# Patient Record
Sex: Male | Born: 1950 | Race: White | Hispanic: No | Marital: Married | Smoking: Former smoker
Health system: Southern US, Community
[De-identification: ages and names within clinical notes are randomized; demographics above are authoritative.]

## PROBLEM LIST (undated history)

## (undated) DIAGNOSIS — M1990 Unspecified osteoarthritis, unspecified site: Secondary | ICD-10-CM

## (undated) DIAGNOSIS — Z974 Presence of external hearing-aid: Secondary | ICD-10-CM

## (undated) DIAGNOSIS — H269 Unspecified cataract: Secondary | ICD-10-CM

## (undated) HISTORY — DX: Presence of external hearing-aid: Z97.4

## (undated) HISTORY — PX: PANCREAS SURGERY: SHX731

## (undated) HISTORY — DX: Unspecified cataract: H26.9

## (undated) HISTORY — DX: Unspecified osteoarthritis, unspecified site: M19.90

## (undated) HISTORY — PX: EYE SURGERY: SHX253

---

## 2003-02-07 ENCOUNTER — Ambulatory Visit (HOSPITAL_COMMUNITY): Admission: RE | Admit: 2003-02-07 | Discharge: 2003-02-07 | Payer: Self-pay | Admitting: Internal Medicine

## 2003-02-07 ENCOUNTER — Encounter: Payer: Self-pay | Admitting: Internal Medicine

## 2003-02-21 ENCOUNTER — Ambulatory Visit (HOSPITAL_COMMUNITY): Admission: RE | Admit: 2003-02-21 | Discharge: 2003-02-21 | Payer: Self-pay | Admitting: Internal Medicine

## 2003-02-21 ENCOUNTER — Encounter: Payer: Self-pay | Admitting: Internal Medicine

## 2005-03-07 ENCOUNTER — Ambulatory Visit (HOSPITAL_COMMUNITY): Admission: RE | Admit: 2005-03-07 | Discharge: 2005-03-07 | Payer: Self-pay | Admitting: Internal Medicine

## 2005-05-27 ENCOUNTER — Ambulatory Visit: Payer: Self-pay | Admitting: Gastroenterology

## 2005-06-06 ENCOUNTER — Ambulatory Visit: Payer: Self-pay | Admitting: Gastroenterology

## 2010-06-25 ENCOUNTER — Other Ambulatory Visit: Payer: Self-pay | Admitting: Occupational Medicine

## 2010-06-25 ENCOUNTER — Ambulatory Visit: Payer: Self-pay

## 2010-06-25 DIAGNOSIS — M79642 Pain in left hand: Secondary | ICD-10-CM

## 2012-05-05 ENCOUNTER — Ambulatory Visit (INDEPENDENT_AMBULATORY_CARE_PROVIDER_SITE_OTHER): Payer: BC Managed Care – PPO | Admitting: Family Medicine

## 2012-05-05 VITALS — BP 124/72 | HR 71 | Temp 98.4°F | Resp 16 | Ht 71.0 in | Wt 213.0 lb

## 2012-05-05 DIAGNOSIS — T148XXA Other injury of unspecified body region, initial encounter: Secondary | ICD-10-CM

## 2012-05-05 DIAGNOSIS — M79609 Pain in unspecified limb: Secondary | ICD-10-CM

## 2012-05-05 DIAGNOSIS — M79669 Pain in unspecified lower leg: Secondary | ICD-10-CM

## 2012-05-05 DIAGNOSIS — W5501XA Bitten by cat, initial encounter: Secondary | ICD-10-CM

## 2012-05-05 DIAGNOSIS — L02419 Cutaneous abscess of limb, unspecified: Secondary | ICD-10-CM

## 2012-05-05 DIAGNOSIS — L03119 Cellulitis of unspecified part of limb: Secondary | ICD-10-CM

## 2012-05-05 MED ORDER — CEFTRIAXONE SODIUM 1 G IJ SOLR
1.0000 g | INTRAMUSCULAR | Status: DC
  Administered 2012-05-05: 1 g via INTRAMUSCULAR

## 2012-05-05 MED ORDER — AMOXICILLIN-POT CLAVULANATE 875-125 MG PO TABS: 1.0000 | ORAL_TABLET | Freq: Two times a day (BID) | ORAL | Status: DC

## 2012-05-05 NOTE — Progress Notes (Signed)
[address]   [place], [place]  [address]   [phone]  Subjective:    Patient ID: Perry Evans, male    DOB: 12-Oct-1950, 61 y.o.   MRN: [id number]  HPIThis 61 y.o. male presents for evaluation of cat bite R calf.  Occurred two days ago.  Playing with cat and cat scratched R posterior calf.  Worried about infection.  No fever/chills/sweats.  No malaise/fatigue.  Last Tetanus vaccine 2011 with finger crush.  Cats vaccines UTD.  Applied peroxide to wound; also washed with soapy water.  Applied neosporin also.  Still swollen and hurts.  No pain in groin region; no pain along inner calf region.  No pain with ROM of ankle.  PCP:  Dr. Harlen Labs   Review of Systems  Constitutional: Negative for fever, chills, diaphoresis and fatigue.  Musculoskeletal: Positive for joint swelling. Negative for arthralgias.  Skin: Positive for color change and wound. Negative for pallor and rash.        Past Medical History  Diagnosis Date  . Arthritis   . Cataract   . Hearing aid worn     Past Surgical History  Procedure Date  . Eye surgery   . Pancreas surgery     Prior to Admission medications   Medication Sig Start Date End Date Taking? Authorizing Provider  meloxicam (MOBIC) 15 MG tablet Take 15 mg by mouth daily.   Yes Historical Provider, MD  amoxicillin-clavulanate (AUGMENTIN) 875-125 MG per tablet Take 1 tablet by mouth 2 (two) times daily. 05/05/12   Ethelda Chick, MD    No Known Allergies  History   Social History  . Marital Status: Married    Spouse Name: N/A    Number of Children: N/A  . Years of Education: N/A   Occupational History  . Not on file.   Social History Main Topics  . Smoking status: Former Smoker    Quit date: 09/03/2000  . Smokeless tobacco: Not on file  . Alcohol Use: No  . Drug Use: Not on file  . Sexually Active: Not on file   Other Topics Concern  . Not on file   Social History Narrative   Employment: works in Teacher, English as a foreign language for [employer].    History reviewed. No pertinent family history.  Objective:   Physical Exam  Nursing note reviewed. Constitutional: He is oriented to person, place, and time. He appears well-developed and well-nourished. No distress.  Cardiovascular: Normal rate, regular rhythm, normal heart sounds and intact distal pulses.   No murmur heard. Musculoskeletal:       Right ankle: He exhibits swelling. He exhibits normal range of motion, no ecchymosis, no deformity and normal pulse. Achilles tendon normal. Achilles tendon exhibits no pain.  Neurological: He is alert and oriented to person, place, and time.  Skin: He is not diaphoretic.       R LOWER LEG:  THREE SUPERFICIAL LINEAR SCRATCH MARKS/ABRASIONS LATERAL ANKLE WITHOUT ERYTHEMA, DRAINAGE, SWELLING, INDURATION.  POSTERIOR DISTAL CALF WITH ISOLATED PUNCTURE WOUND WITH 1 CM AREA OF ERYTHEMA AND 4 CM DIAMETER OF MILD INDURATION; NO FLUCTUANTS; SCANT BLOODY-WHITE DRAINAGE EXPRESSED FROM PUNCTURE WOUND.  NO STREAKING.  +SWELLING LATERAL R ANKLE.  NO CALF INDURATION.  Psychiatric: He has a normal mood and affect. His behavior is normal. Judgment and thought content normal.    WOUND CULTURE PENDING.  ROCEPHIN 1 GRAM IM ADMINISTERED DURING VISIT.     Assessment & Plan:   1. Cat bite  Wound culture, cefTRIAXone (  ROCEPHIN) injection 1 g, amoxicillin-clavulanate (AUGMENTIN) 875-125 MG per tablet  2. Pain, lower leg    3. Cellulitis and abscess of leg       1.  Pain RLE:  New.  Secondary to cat bite, cellulitis.  Recommend Tylenol or Motrin PRN. 2.  Cat bite:  New.  With secondary infection.  Tetanus UTD in 2011.  Cat is pt's pet and immunizations UTD; no unusual behavior.  Local wound care -- clean wound with soap and water daily; apply peroxide once daily; apply Neosporin once daily. Keep clean and covered with activity. 3. Cellulitis RLE:  New.  Wound culture obtained; s/p Rocephin injection in office; treat with Augmentin bid.  RTC  immediately for fever, streaking, LAD, increasing pain, swelling.    Meds ordered this encounter  Medications  . meloxicam (MOBIC) 15 MG tablet    Sig: Take 15 mg by mouth daily.  . cefTRIAXone (ROCEPHIN) injection 1 g    Sig:   . amoxicillin-clavulanate (AUGMENTIN) 875-125 MG per tablet    Sig: Take 1 tablet by mouth 2 (two) times daily.    Dispense:  20 tablet    Refill:  0

## 2012-05-05 NOTE — Patient Instructions (Addendum)
1. Cat bite  Wound culture, cefTRIAXone (ROCEPHIN) injection 1 g, amoxicillin-clavulanate (AUGMENTIN) 875-125 MG per tablet  2. Pain, lower leg    3. Cellulitis and abscess of leg       1. CLEAN WOUND DAILY WITH SOAP AND WATER; ALSO CLEAN WITH PEROXIDE ONCE DAILY; APPLY NEOSPORIN TO WOUND EVERY MORNING.  KEEP CLEAN AND COVERED DURING THE DAY.  REMOVE BANDAGE EVERY EVENING. 2.  RETURN IMMEDIATELY FOR FEVER, INCREASING PAIN, INCREASING REDNESS, INCREASING SWELLING OF LEG. 3. APPLY HEAT OR WARM COMPRESS TO WOUND FOR 15 MINUTES TWO TO THREE TIMES DAILY. 4. ELEVATE LEG WHILE SITTING TO HELP WITH SWELLING.

## 2012-05-08 LAB — WOUND CULTURE
Gram Stain: NONE SEEN
Gram Stain: NONE SEEN
Organism ID, Bacteria: NO GROWTH

## 2013-04-28 ENCOUNTER — Ambulatory Visit (INDEPENDENT_AMBULATORY_CARE_PROVIDER_SITE_OTHER): Payer: BC Managed Care – PPO | Admitting: Internal Medicine

## 2013-04-28 ENCOUNTER — Encounter: Payer: Self-pay | Admitting: Internal Medicine

## 2013-04-28 VITALS — BP 132/84 | HR 88 | Temp 99.3°F | Resp 18 | Wt 212.8 lb

## 2013-04-28 DIAGNOSIS — R7309 Other abnormal glucose: Secondary | ICD-10-CM | POA: Insufficient documentation

## 2013-04-28 DIAGNOSIS — R7303 Prediabetes: Secondary | ICD-10-CM | POA: Insufficient documentation

## 2013-04-28 DIAGNOSIS — I10 Essential (primary) hypertension: Secondary | ICD-10-CM | POA: Insufficient documentation

## 2013-04-28 DIAGNOSIS — J0410 Acute tracheitis without obstruction: Secondary | ICD-10-CM

## 2013-04-28 DIAGNOSIS — J029 Acute pharyngitis, unspecified: Secondary | ICD-10-CM

## 2013-04-28 DIAGNOSIS — J019 Acute sinusitis, unspecified: Secondary | ICD-10-CM

## 2013-04-28 DIAGNOSIS — E559 Vitamin D deficiency, unspecified: Secondary | ICD-10-CM | POA: Insufficient documentation

## 2013-04-28 DIAGNOSIS — E782 Mixed hyperlipidemia: Secondary | ICD-10-CM | POA: Insufficient documentation

## 2013-04-28 MED ORDER — PREDNISONE 20 MG PO TABS: 20.0000 mg | ORAL_TABLET | ORAL | Status: DC

## 2013-04-28 MED ORDER — HYDROCODONE-ACETAMINOPHEN 5-325 MG PO TABS: ORAL_TABLET | ORAL | Status: DC

## 2013-04-28 MED ORDER — AZITHROMYCIN 250 MG PO TABS: ORAL_TABLET | ORAL | Status: AC

## 2013-04-28 NOTE — Patient Instructions (Signed)
Bronchitis Bronchitis is the body's way of reacting to injury and/or infection (inflammation) of the bronchi. Bronchi are the air tubes that extend from the windpipe into the lungs. If the inflammation becomes severe, it may cause shortness of breath. CAUSES  Inflammation may be caused by:  A virus.  Germs (bacteria).  Dust.  Allergens.  Pollutants and many other irritants. The cells lining the bronchial tree are covered with tiny hairs (cilia). These constantly beat upward, away from the lungs, toward the mouth. This keeps the lungs free of pollutants. When these cells become too irritated and are unable to do their job, mucus begins to develop. This causes the characteristic cough of bronchitis. The cough clears the lungs when the cilia are unable to do their job. Without either of these protective mechanisms, the mucus would settle in the lungs. Then you would develop pneumonia. Smoking is a common cause of bronchitis and can contribute to pneumonia. Stopping this habit is the single most important thing you can do to help yourself. TREATMENT   Your caregiver may prescribe an antibiotic if the cough is caused by bacteria. Also, medicines that open up your airways make it easier to breathe. Your caregiver may also recommend or prescribe an expectorant. It will loosen the mucus to be coughed up. Only take over-the-counter or prescription medicines for pain, discomfort, or fever as directed by your caregiver.  Removing whatever causes the problem (smoking, for example) is critical to preventing the problem from getting worse.  Cough suppressants may be prescribed for relief of cough symptoms.  Inhaled medicines may be prescribed to help with symptoms now and to help prevent problems from returning.  For those with recurrent (chronic) bronchitis, there may be a need for steroid medicines. SEEK IMMEDIATE MEDICAL CARE IF:   During treatment, you develop more pus-like mucus (purulent  sputum).  You have a fever.  You become progressively more ill.  You have increased difficulty breathing, wheezing, or shortness of breath. It is necessary to seek immediate medical care if you are elderly or sick from any other disease. MAKE SURE YOU:   Understand these instructions.  Will watch your condition.  Will get help right away if you are not doing well or get worse. Document Released: 04/29/2005 Document Revised: 12/30/2012 Document Reviewed: 12/22/2012 Exit[employer] Patient Information 2014 [employer]. Sinusitis Sinusitis is redness, soreness, and swelling (inflammation) of the paranasal sinuses. Paranasal sinuses are air pockets within the bones of your face (beneath the eyes, the middle of the forehead, or above the eyes). In healthy paranasal sinuses, mucus is able to drain out, and air is able to circulate through them by way of your nose. However, when your paranasal sinuses are inflamed, mucus and air can become trapped. This can allow bacteria and other germs to grow and cause infection. Sinusitis can develop quickly and last only a short time (acute) or continue over a long period (chronic). Sinusitis that lasts for more than 12 weeks is considered chronic.  CAUSES  Causes of sinusitis include:  Allergies.  Structural abnormalities, such as displacement of the cartilage that separates your nostrils (deviated septum), which can decrease the air flow through your nose and sinuses and affect sinus drainage.  Functional abnormalities, such as when the small hairs (cilia) that line your sinuses and help remove mucus do not work properly or are not present. SYMPTOMS  Symptoms of acute and chronic sinusitis are the same. The primary symptoms are pain and pressure around the affected sinuses. Other   symptoms include:  Upper toothache.  Earache.  Headache.  Bad breath.  Decreased sense of smell and taste.  A cough, which worsens when you are lying  flat.  Fatigue.  Fever.  Thick drainage from your nose, which often is green and may contain pus (purulent).  Swelling and warmth over the affected sinuses. DIAGNOSIS  Your caregiver will perform a physical exam. During the exam, your caregiver may:  Look in your nose for signs of abnormal growths in your nostrils (nasal polyps).  Tap over the affected sinus to check for signs of infection.  View the inside of your sinuses (endoscopy) with a special imaging device with a light attached (endoscope), which is inserted into your sinuses. If your caregiver suspects that you have chronic sinusitis, one or more of the following tests may be recommended:  Allergy tests.  Nasal culture A sample of mucus is taken from your nose and sent to a lab and screened for bacteria.  Nasal cytology A sample of mucus is taken from your nose and examined by your caregiver to determine if your sinusitis is related to an allergy. TREATMENT  Most cases of acute sinusitis are related to a viral infection and will resolve on their own within 10 days. Sometimes medicines are prescribed to help relieve symptoms (pain medicine, decongestants, nasal steroid sprays, or saline sprays).  However, for sinusitis related to a bacterial infection, your caregiver will prescribe antibiotic medicines. These are medicines that will help kill the bacteria causing the infection.  Rarely, sinusitis is caused by a fungal infection. In theses cases, your caregiver will prescribe antifungal medicine. For some cases of chronic sinusitis, surgery is needed. Generally, these are cases in which sinusitis recurs more than 3 times per year, despite other treatments. HOME CARE INSTRUCTIONS   Drink plenty of water. Water helps thin the mucus so your sinuses can drain more easily.  Use a humidifier.  Inhale steam 3 to 4 times a day (for example, sit in the bathroom with the shower running).  Apply a warm, moist washcloth to your face 3  to 4 times a day, or as directed by your caregiver.  Use saline nasal sprays to help moisten and clean your sinuses.  Take over-the-counter or prescription medicines for pain, discomfort, or fever only as directed by your caregiver. SEEK IMMEDIATE MEDICAL CARE IF:  You have increasing pain or severe headaches.  You have nausea, vomiting, or drowsiness.  You have swelling around your face.  You have vision problems.  You have a stiff neck.  You have difficulty breathing. MAKE SURE YOU:   Understand these instructions.  Will watch your condition.  Will get help right away if you are not doing well or get worse. Document Released: 04/29/2005 Document Revised: 07/22/2011 Document Reviewed: 05/14/2011 ExitCare Patient Information 2014 [employer].  

## 2013-04-28 NOTE — Progress Notes (Signed)
Subjective:     Patient ID: Perry Evans, male   DOB: Oct 02, 1950, 62 y.o.   MRN: [id number]  Sore Throat  This is a new problem. The current episode started yesterday. The problem has been rapidly worsening. The pain is worse on the right side. The maximum temperature recorded prior to his arrival was 100 - 100.9 F. The fever has been present for 1 to 2 days. The pain is at a severity of 5/10. The pain is moderate. Associated symptoms include congestion, ear pain, headaches, a hoarse voice and trouble swallowing. Pertinent negatives include no abdominal pain, diarrhea, drooling, ear discharge, plugged ear sensation, neck pain, stridor, swollen glands or vomiting.  Cough Associated symptoms include chest pain, ear pain, a fever, headaches, postnasal drip, rhinorrhea and a sore throat. Pertinent negatives include no wheezing.  Sinusitis Associated symptoms include congestion, diaphoresis, ear pain, headaches, a hoarse voice, sinus pressure and a sore throat. Pertinent negatives include no neck pain or swollen glands.     Review of Systems  Constitutional: Positive for fever and diaphoresis.  HENT: Positive for congestion, ear pain, hoarse voice, postnasal drip, rhinorrhea, sinus pressure, sore throat and trouble swallowing. Negative for drooling and ear discharge.   Eyes: Negative.   Respiratory: Positive for chest tightness. Negative for wheezing and stridor.   Cardiovascular: Positive for chest pain. Negative for leg swelling.  Gastrointestinal: Negative for vomiting, abdominal pain and diarrhea.  Genitourinary: Negative.   Musculoskeletal: Negative for neck pain.  Neurological: Positive for headaches.     Medication List       This list is accurate as of: 04/28/13  6:11 PM.  Always use your most recent med list.               aspirin 81 MG tablet  Take 81 mg by mouth daily.     azithromycin 250 MG tablet  Commonly known as:  ZITHROMAX  Take 2 tablets (500 mg) on  Day 1,  followed  by 1 tablet (250 mg) once daily on Days 2 through 5.     FISH OIL PO  Take by mouth daily.     FLAXSEED OIL PO  Take by mouth. Takes 1 daily     HYDROcodone-acetaminophen 5-325 MG per tablet  Commonly known as:  NORCO  1/2 to 1 tablet every 3 to 4 hours for cough or pain     predniSONE 20 MG tablet  Commonly known as:  DELTASONE  Take 1 tablet (20 mg total) by mouth See admin instructions. 1 tab 3 x day for 3 days, then 1 tab 2 x day for 3 days, then 1 tab 1 x day for 5 days     VITAMIN D PO  Take 5,000 Units by mouth daily.       No Known Allergies    Objective:   Physical Exam  Constitutional: He is oriented to person, place, and time. He appears well-nourished.  HENT:  Head: Atraumatic.  Right Ear: External ear normal.  Left Ear: External ear normal.  Mouth/Throat: Oropharyngeal exudate present.  Eyes: Conjunctivae and EOM are normal. Pupils are equal, round, and reactive to light. Right eye exhibits no discharge. Left eye exhibits no discharge.  Neck: Normal range of motion. No JVD present. No thyromegaly present.  Cardiovascular: Normal rate, regular rhythm and normal heart sounds.   No murmur heard. Pulmonary/Chest: Effort normal. No respiratory distress. He has no wheezes. He has rales. He exhibits tenderness.  Abdominal: Soft. Bowel sounds are normal.  Musculoskeletal: Normal range of motion.  Lymphadenopathy:    He has cervical adenopathy.  Neurological: He is alert and oriented to person, place, and time.  Skin: Skin is warm and dry. No rash noted. No erythema.  Psychiatric: He has a normal mood and affect.       Assessment:      1. Acute sinusitis, unspecified - ZPak x 1 RF  2. Acute tracheitis without mention of obstruction  - predniSONE (DELTASONE) 20 MG tablet; Take 1 tablet (20 mg total) by mouth See admin instructions. 1 tab 3 x day for 3 days, then 1 tab 2 x day for 3 days, then 1 tab 1 x day for 5 days  Dispense: 20 tablet; Refill: 0 -  HYDROcodone-acetaminophen (NORCO) 5-325 MG per tablet; 1/2 to 1 tablet every 3 to 4 hours for cough or pain  Dispense: 50 tablet; Refill: 0  3. Acute pharyngitis

## 2013-06-03 ENCOUNTER — Ambulatory Visit (INDEPENDENT_AMBULATORY_CARE_PROVIDER_SITE_OTHER): Payer: BC Managed Care – PPO | Admitting: Internal Medicine

## 2013-06-03 ENCOUNTER — Encounter: Payer: Self-pay | Admitting: Internal Medicine

## 2013-06-03 VITALS — BP 116/72 | HR 72 | Temp 98.1°F | Resp 16 | Ht 71.0 in | Wt 207.2 lb

## 2013-06-03 DIAGNOSIS — Z125 Encounter for screening for malignant neoplasm of prostate: Secondary | ICD-10-CM

## 2013-06-03 DIAGNOSIS — R740 Nonspecific elevation of levels of transaminase and lactic acid dehydrogenase [LDH]: Secondary | ICD-10-CM

## 2013-06-03 DIAGNOSIS — R7402 Elevation of levels of lactic acid dehydrogenase (LDH): Secondary | ICD-10-CM

## 2013-06-03 DIAGNOSIS — Z111 Encounter for screening for respiratory tuberculosis: Secondary | ICD-10-CM

## 2013-06-03 DIAGNOSIS — R7401 Elevation of levels of liver transaminase levels: Secondary | ICD-10-CM

## 2013-06-03 DIAGNOSIS — E559 Vitamin D deficiency, unspecified: Secondary | ICD-10-CM

## 2013-06-03 DIAGNOSIS — Z113 Encounter for screening for infections with a predominantly sexual mode of transmission: Secondary | ICD-10-CM

## 2013-06-03 DIAGNOSIS — Z0000 Encounter for general adult medical examination without abnormal findings: Secondary | ICD-10-CM

## 2013-06-03 DIAGNOSIS — I10 Essential (primary) hypertension: Secondary | ICD-10-CM

## 2013-06-03 DIAGNOSIS — Z1212 Encounter for screening for malignant neoplasm of rectum: Secondary | ICD-10-CM

## 2013-06-03 DIAGNOSIS — Z79899 Other long term (current) drug therapy: Secondary | ICD-10-CM

## 2013-06-03 LAB — CBC WITH DIFFERENTIAL/PLATELET
BASOS ABS: 0 10*3/uL (ref 0.0–0.1)
BASOS PCT: 1 % (ref 0–1)
EOS PCT: 3 % (ref 0–5)
Eosinophils Absolute: 0.2 10*3/uL (ref 0.0–0.7)
HEMATOCRIT: 46.9 % (ref 39.0–52.0)
Hemoglobin: 16.7 g/dL (ref 13.0–17.0)
Lymphocytes Relative: 32 % (ref 12–46)
Lymphs Abs: 2.6 10*3/uL (ref 0.7–4.0)
MCH: 30.8 pg (ref 26.0–34.0)
MCHC: 35.6 g/dL (ref 30.0–36.0)
MCV: 86.4 fL (ref 78.0–100.0)
MONO ABS: 0.8 10*3/uL (ref 0.1–1.0)
MONOS PCT: 9 % (ref 3–12)
NEUTROS ABS: 4.7 10*3/uL (ref 1.7–7.7)
Neutrophils Relative %: 55 % (ref 43–77)
Platelets: 233 10*3/uL (ref 150–400)
RBC: 5.43 MIL/uL (ref 4.22–5.81)
RDW: 13.9 % (ref 11.5–15.5)
WBC: 8.3 10*3/uL (ref 4.0–10.5)

## 2013-06-03 LAB — HEMOGLOBIN A1C
Hgb A1c MFr Bld: 5.4 % (ref <5.7)
MEAN PLASMA GLUCOSE: 108 mg/dL (ref <117)

## 2013-06-03 NOTE — Patient Instructions (Signed)

## 2013-06-03 NOTE — Progress Notes (Signed)
Patient ID: Perry MarkerDaniel Evans, male   DOB: 02/06/1951, 63 y.o.   MRN: [id number]  Annual Screening Comprehensive Examination  This very nice 63 y.o.  MWM presents for complete physical.  Patient has been followed for HTN, Prediabetes, Hyperlipidemia, and Vitamin D Deficiency.   HTN predates since 2007 at which time he was initially treated with Atenolol which he self discontinued and since which time he has been monitore expectantly. Patient's BP has been controlled at home.Today's BP: 116/72 mmHg. Patient denies any cardiac symptoms as chest pain, palpitations, shortness of breath, dizziness or ankle swelling.   Patient's hyperlipidemia is controlled with diet and medications. Patient denies myalgias or other medication SE's. Last cholesterol last visit was 172, triglycerides147, HDL 41 and LDL 102 in Jan 2014 at goal.     Patient uis monitored for  prediabetes and insulin resistance with last A1c 5.6 % in 05/2012.Marland Kitchen. Patient denies reactive hypoglycemic symptoms, visual blurring, diabetic polys, or paresthesias.    Patient has Testosterone Deficiency with low level of 132 in 11/2010 and was treated for a time with Androgel which he self d/c'd due to lack of perceived benefit.   Finally, patient has history of Vitamin D Deficiency of 6343 in 2008 with last vitamin D 50 in Jan 2014.       Medication List       aspirin 81 MG tablet  Take 81 mg by mouth daily.     FISH OIL PO  Take by mouth daily.     FLAXSEED OIL PO  Take by mouth. Takes 1 daily     HYDROcodone-acetaminophen 5-325 MG per tablet  Commonly known as:  NORCO/VICODIN  Take 1 tablet by mouth every 6 (six) hours as needed for moderate pain. 1/2 -1 prn every 3-4-hours cough or pain     VITAMIN D PO  Take 5,000 Units by mouth daily.        No Known Allergies  Past Medical History  Diagnosis Date  . Hearing aid worn   . Arthritis   . Cataract     Past Surgical History  Procedure Laterality Date  . Eye surgery    . Pancreas  surgery        History   Social History  . Marital Status: Married    Spouse Name: N/A    Number of Children: N/A  . Years of Education: N/A   Occupational History  . Works in Production designer, theatre/television/filmmaintenance for [employer]   Social History Main Topics  . Smoking status: Former Smoker    Quit date: 09/03/2000  . Smokeless tobacco: Not on file  . Alcohol Use: None  . Drug Use: None  . Sexual Activity: Active   Other Topics Concern  . 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.    ROS Constitutional: Denies fever, chills, weight loss/gain, headaches, insomnia, fatigue, night sweats, and change in appetite. Eyes: Denies redness, blurred vision, diplopia, discharge, itchy, watery eyes.  ENT: Denies discharge, congestion, post nasal drip, epistaxis, sore throat, earache, hearing loss, dental pain, Tinnitus, Vertigo, Sinus pain, snoring.  Cardio: Denies chest pain, palpitations, irregular heartbeat, syncope, dyspnea, diaphoresis, orthopnea, PND, claudication, edema Respiratory: denies cough, dyspnea, DOE, pleurisy, hoarseness, laryngitis, wheezing.  Gastrointestinal: Denies dysphagia, heartburn, reflux, water brash, pain, cramps, nausea, vomiting, bloating, diarrhea, constipation, hematemesis, melena, hematochezia, jaundice, hemorrhoids Genitourinary: Denies dysuria, frequency, urgency, nocturia, hesitancy, discharge, hematuria, flank pain Musculoskeletal: Denies arthralgia, myalgia, stiffness, Jt. Swelling,  pain, limp, and strain/sprain. Skin: Denies puritis, rash, hives, warts, acne, eczema, changing in skin lesion Neuro: No weakness, tremor, incoordination, spasms, paresthesia, pain Psychiatric: Denies confusion, memory loss, sensory loss Endocrine: Denies change in weight, skin, hair change, nocturia, and paresthesia, diabetic polys, visual blurring, hyper / hypo glycemic episodes.  Heme/Lymph: No excessive  bleeding, bruising, or elarged lymph nodes.  BP: 116/72  Pulse: 72  Temp: 98.1 F (36.7 C)  Resp: 16    Estimated body mass index is 28.91 kg/(m^2) as calculated from the following:   Height as of this encounter: 5\' 11"  (1.803 m).   Weight as of this encounter: 207 lb 3.2 oz (93.985 kg).  Physical Exam General Appearance: Well nourished, in no apparent distress. Eyes: PERRLA, EOMs, conjunctiva no swelling or erythema, normal fundi and vessels. Sinuses: No frontal/maxillary tenderness ENT/Mouth: EACs patent / TMs  nl. Nares clear without erythema, swelling, mucoid exudates. Oral hygiene is good. No erythema, swelling, or exudate. Tongue normal, non-obstructing. Tonsils not swollen or erythematous. Hearing normal.  Neck: Supple, thyroid normal. No bruits, nodes or JVD. Respiratory: Respiratory effort normal.  BS equal and clear bilateral without rales, rhonci, wheezing or stridor. Cardio: Heart sounds are normal with regular rate and rhythm and no murmurs, rubs or gallops. Peripheral pulses are normal and equal bilaterally without edema. No aortic or femoral bruits. Chest: symmetric with normal excursions and percussion.  Abdomen: Flat, soft, with bowl sounds. Nontender, no guarding, rebound, hernias, masses, or organomegaly.  Lymphatics: Non tender without lymphadenopathy.  Genitourinary: No hernias.Testes nl. DRE - prostate nl for age - smooth & firm w/o nodules. Musculoskeletal: Full ROM all peripheral extremities, joint stability, 5/5 strength, and normal gait. Skin: Warm and dry without rashes, lesions, cyanosis, clubbing or  ecchymosis.  Neuro: Cranial nerves intact, reflexes equal bilaterally. Normal muscle tone, no cerebellar symptoms. Sensation intact.  Pysch: Awake and oriented X 3, normal affect, insight and judgment appropriate.   Assessment and Plan  1. Annual Screening Examination 2. Hypertension, labile - monitoring expectantly  3. Hyperlipidemia, Diet controlled  4.  Pre Diabetes, Screening 5. Vitamin D Deficiency  Continue prudent diet as discussed, weight control, BP monitoring, regular exercise, and medications as discussed.  Discussed med effects and SE's. Routine screening labs and tests as requested with regular follow-up as recommended.

## 2013-06-04 LAB — BASIC METABOLIC PANEL WITH GFR
BUN: 18 mg/dL (ref 6–23)
CHLORIDE: 101 meq/L (ref 96–112)
CO2: 29 mEq/L (ref 19–32)
CREATININE: 1.02 mg/dL (ref 0.50–1.35)
Calcium: 9.8 mg/dL (ref 8.4–10.5)
GFR, EST NON AFRICAN AMERICAN: 78 mL/min
GFR, Est African American: >89 mL/min
GLUCOSE: 96 mg/dL (ref 70–99)
POTASSIUM: 4.1 meq/L (ref 3.5–5.3)
Sodium: 138 mEq/L (ref 135–145)

## 2013-06-04 LAB — LIPID PANEL
Cholesterol: 172 mg/dL (ref 0–200)
HDL: 44 mg/dL (ref >39)
LDL CALC: 102 mg/dL — AB (ref 0–99)
Total CHOL/HDL Ratio: 3.9 Ratio
Triglycerides: 130 mg/dL (ref <150)
VLDL: 26 mg/dL (ref 0–40)

## 2013-06-04 LAB — HEPATIC FUNCTION PANEL
ALK PHOS: 61 U/L (ref 39–117)
ALT: 31 U/L (ref 0–53)
AST: 26 U/L (ref 0–37)
Albumin: 4.8 g/dL (ref 3.5–5.2)
BILIRUBIN INDIRECT: 0.7 mg/dL (ref 0.0–0.9)
Bilirubin, Direct: 0.2 mg/dL (ref 0.0–0.3)
TOTAL PROTEIN: 6.7 g/dL (ref 6.0–8.3)
Total Bilirubin: 0.9 mg/dL (ref 0.3–1.2)

## 2013-06-04 LAB — URINALYSIS, MICROSCOPIC ONLY
BACTERIA UA: NONE SEEN
Casts: NONE SEEN
Crystals: NONE SEEN
Squamous Epithelial / LPF: NONE SEEN

## 2013-06-04 LAB — MICROALBUMIN / CREATININE URINE RATIO
Creatinine, Urine: 128.4 mg/dL
MICROALB UR: 0.53 mg/dL (ref 0.00–1.89)
MICROALB/CREAT RATIO: 4.1 mg/g (ref 0.0–30.0)

## 2013-06-04 LAB — PSA: PSA: 0.74 ng/mL (ref <=4.00)

## 2013-06-04 LAB — HIV ANTIBODY (ROUTINE TESTING W REFLEX): HIV: NONREACTIVE

## 2013-06-04 LAB — HEPATITIS B CORE ANTIBODY, TOTAL: Hep B Core Total Ab: NONREACTIVE

## 2013-06-04 LAB — TESTOSTERONE: TESTOSTERONE: 246 ng/dL — AB (ref 300–890)

## 2013-06-04 LAB — VITAMIN D 25 HYDROXY (VIT D DEFICIENCY, FRACTURES): VIT D 25 HYDROXY: 61 ng/mL (ref 30–89)

## 2013-06-04 LAB — TSH: TSH: 2.448 u[IU]/mL (ref 0.350–4.500)

## 2013-06-04 LAB — HEPATITIS B SURFACE ANTIBODY,QUALITATIVE: HEP B S AB: NEGATIVE

## 2013-06-04 LAB — HEPATITIS C ANTIBODY: HCV Ab: NEGATIVE

## 2013-06-04 LAB — INSULIN, FASTING: INSULIN FASTING, SERUM: 5 u[IU]/mL (ref 3–28)

## 2013-06-04 LAB — RPR

## 2013-06-04 LAB — VITAMIN B12: Vitamin B-12: 1352 pg/mL — ABNORMAL HIGH (ref 211–911)

## 2013-06-04 LAB — MAGNESIUM: MAGNESIUM: 2.1 mg/dL (ref 1.5–2.5)

## 2013-06-04 LAB — HEPATITIS A ANTIBODY, TOTAL: HEP A TOTAL AB: REACTIVE — AB

## 2013-06-07 ENCOUNTER — Encounter: Payer: Self-pay | Admitting: *Deleted

## 2013-06-07 LAB — HEPATITIS B E ANTIBODY: Hepatitis Be Antibody: NEGATIVE

## 2013-11-18 ENCOUNTER — Ambulatory Visit (INDEPENDENT_AMBULATORY_CARE_PROVIDER_SITE_OTHER): Payer: BC Managed Care – PPO | Admitting: Emergency Medicine

## 2013-11-18 ENCOUNTER — Emergency Department (HOSPITAL_BASED_OUTPATIENT_CLINIC_OR_DEPARTMENT_OTHER)
Admission: EM | Admit: 2013-11-18 | Discharge: 2013-11-18 | Disposition: A | Discharge status: Home / Self Care | Payer: BC Managed Care – PPO | Attending: Emergency Medicine | Admitting: Emergency Medicine

## 2013-11-18 ENCOUNTER — Encounter (HOSPITAL_BASED_OUTPATIENT_CLINIC_OR_DEPARTMENT_OTHER): Payer: Self-pay | Admitting: Emergency Medicine

## 2013-11-18 ENCOUNTER — Emergency Department (HOSPITAL_BASED_OUTPATIENT_CLINIC_OR_DEPARTMENT_OTHER): Payer: BC Managed Care – PPO

## 2013-11-18 ENCOUNTER — Encounter: Payer: Self-pay | Admitting: Emergency Medicine

## 2013-11-18 VITALS — BP 108/60 | HR 86 | Temp 99.8°F | Resp 16 | Ht 71.0 in | Wt 201.0 lb

## 2013-11-18 DIAGNOSIS — K5289 Other specified noninfective gastroenteritis and colitis: Secondary | ICD-10-CM | POA: Insufficient documentation

## 2013-11-18 DIAGNOSIS — R51 Headache: Secondary | ICD-10-CM | POA: Insufficient documentation

## 2013-11-18 DIAGNOSIS — K529 Noninfective gastroenteritis and colitis, unspecified: Secondary | ICD-10-CM

## 2013-11-18 DIAGNOSIS — Z7982 Long term (current) use of aspirin: Secondary | ICD-10-CM | POA: Insufficient documentation

## 2013-11-18 DIAGNOSIS — M129 Arthropathy, unspecified: Secondary | ICD-10-CM | POA: Insufficient documentation

## 2013-11-18 DIAGNOSIS — Z79899 Other long term (current) drug therapy: Secondary | ICD-10-CM | POA: Insufficient documentation

## 2013-11-18 DIAGNOSIS — R509 Fever, unspecified: Secondary | ICD-10-CM

## 2013-11-18 DIAGNOSIS — Z8669 Personal history of other diseases of the nervous system and sense organs: Secondary | ICD-10-CM | POA: Insufficient documentation

## 2013-11-18 DIAGNOSIS — R110 Nausea: Secondary | ICD-10-CM | POA: Insufficient documentation

## 2013-11-18 DIAGNOSIS — L309 Dermatitis, unspecified: Secondary | ICD-10-CM

## 2013-11-18 DIAGNOSIS — R21 Rash and other nonspecific skin eruption: Secondary | ICD-10-CM | POA: Insufficient documentation

## 2013-11-18 DIAGNOSIS — E860 Dehydration: Secondary | ICD-10-CM

## 2013-11-18 DIAGNOSIS — Z87891 Personal history of nicotine dependence: Secondary | ICD-10-CM | POA: Insufficient documentation

## 2013-11-18 DIAGNOSIS — L259 Unspecified contact dermatitis, unspecified cause: Secondary | ICD-10-CM

## 2013-11-18 LAB — CBC WITH DIFFERENTIAL/PLATELET
BASOS ABS: 0 10*3/uL (ref 0.0–0.1)
Basophils Relative: 0 % (ref 0–1)
EOS ABS: 0 10*3/uL (ref 0.0–0.7)
Eosinophils Relative: 0 % (ref 0–5)
HCT: 43.6 % (ref 39.0–52.0)
HEMOGLOBIN: 16 g/dL (ref 13.0–17.0)
Lymphocytes Relative: 5 % — ABNORMAL LOW (ref 12–46)
Lymphs Abs: 0.8 10*3/uL (ref 0.7–4.0)
MCH: 31.1 pg (ref 26.0–34.0)
MCHC: 36.7 g/dL — AB (ref 30.0–36.0)
MCV: 84.7 fL (ref 78.0–100.0)
MONOS PCT: 5 % (ref 3–12)
Monocytes Absolute: 0.8 10*3/uL (ref 0.1–1.0)
Neutro Abs: 12.5 10*3/uL — ABNORMAL HIGH (ref 1.7–7.7)
Neutrophils Relative %: 89 % — ABNORMAL HIGH (ref 43–77)
Platelets: 182 10*3/uL (ref 150–400)
RBC: 5.15 MIL/uL (ref 4.22–5.81)
RDW: 13.5 % (ref 11.5–15.5)
WBC: 14.1 10*3/uL — ABNORMAL HIGH (ref 4.0–10.5)

## 2013-11-18 LAB — COMPREHENSIVE METABOLIC PANEL
ALBUMIN: 3.7 g/dL (ref 3.5–5.2)
ALT: 28 U/L (ref 0–53)
ANION GAP: 17 — AB (ref 5–15)
AST: 25 U/L (ref 0–37)
Alkaline Phosphatase: 54 U/L (ref 39–117)
BUN: 15 mg/dL (ref 6–23)
CO2: 22 mEq/L (ref 19–32)
CREATININE: 1 mg/dL (ref 0.50–1.35)
Calcium: 9.7 mg/dL (ref 8.4–10.5)
Chloride: 97 mEq/L (ref 96–112)
GFR calc Af Amer: >90 mL/min (ref >90)
GFR calc non Af Amer: 78 mL/min — ABNORMAL LOW (ref >90)
Glucose, Bld: 121 mg/dL — ABNORMAL HIGH (ref 70–99)
Potassium: 3.8 mEq/L (ref 3.7–5.3)
Sodium: 136 mEq/L — ABNORMAL LOW (ref 137–147)
TOTAL PROTEIN: 6.5 g/dL (ref 6.0–8.3)
Total Bilirubin: 1.3 mg/dL — ABNORMAL HIGH (ref 0.3–1.2)

## 2013-11-18 LAB — URINALYSIS, ROUTINE W REFLEX MICROSCOPIC
Bilirubin Urine: NEGATIVE
GLUCOSE, UA: NEGATIVE mg/dL
Ketones, ur: 15 mg/dL — AB
Leukocytes, UA: NEGATIVE
Nitrite: NEGATIVE
PH: 5.5 (ref 5.0–8.0)
Protein, ur: NEGATIVE mg/dL
Specific Gravity, Urine: 1.021 (ref 1.005–1.030)
Urobilinogen, UA: 0.2 mg/dL (ref 0.0–1.0)

## 2013-11-18 LAB — URINE MICROSCOPIC-ADD ON

## 2013-11-18 LAB — LIPASE, BLOOD: Lipase: 11 U/L (ref 11–59)

## 2013-11-18 MED ORDER — ACETAMINOPHEN 325 MG PO TABS
650.0000 mg | ORAL_TABLET | Freq: Once | ORAL | Status: AC
  Administered 2013-11-18: 650 mg via ORAL
  Filled 2013-11-18: qty 2

## 2013-11-18 MED ORDER — APAP 325 MG PO TABS: 650.0000 mg | ORAL_TABLET | Freq: Four times a day (QID) | ORAL | Status: DC | PRN

## 2013-11-18 MED ORDER — IOHEXOL 300 MG/ML  SOLN
100.0000 mL | Freq: Once | INTRAMUSCULAR | Status: AC | PRN
  Administered 2013-11-18: 100 mL via INTRAVENOUS

## 2013-11-18 MED ORDER — DEXAMETHASONE SODIUM PHOSPHATE 100 MG/10ML IJ SOLN
10.0000 mg | Freq: Once | INTRAMUSCULAR | Status: AC
  Administered 2013-11-18: 10 mg via INTRAMUSCULAR

## 2013-11-18 MED ORDER — METRONIDAZOLE 500 MG PO TABS: 500.0000 mg | ORAL_TABLET | Freq: Two times a day (BID) | ORAL | Status: DC

## 2013-11-18 MED ORDER — SODIUM CHLORIDE 0.9 % IV BOLUS (SEPSIS)
1000.0000 mL | Freq: Once | INTRAVENOUS | Status: AC
  Administered 2013-11-18: 1000 mL via INTRAVENOUS

## 2013-11-18 MED ORDER — LOPERAMIDE HCL 2 MG PO CAPS: 2.0000 mg | ORAL_CAPSULE | Freq: Four times a day (QID) | ORAL | Status: DC | PRN

## 2013-11-18 MED ORDER — IOHEXOL 300 MG/ML  SOLN
50.0000 mL | Freq: Once | INTRAMUSCULAR | Status: AC | PRN
  Administered 2013-11-18: 50 mL via ORAL

## 2013-11-18 MED ORDER — METRONIDAZOLE IN NACL 5-0.79 MG/ML-% IV SOLN
500.0000 mg | Freq: Once | INTRAVENOUS | Status: AC
  Administered 2013-11-18: 500 mg via INTRAVENOUS
  Filled 2013-11-18: qty 100

## 2013-11-18 MED ORDER — ONDANSETRON HCL 4 MG/2ML IJ SOLN
4.0000 mg | Freq: Once | INTRAMUSCULAR | Status: AC
  Administered 2013-11-18: 4 mg via INTRAVENOUS
  Filled 2013-11-18: qty 2

## 2013-11-18 MED ORDER — CIPROFLOXACIN IN D5W 400 MG/200ML IV SOLN
400.0000 mg | Freq: Once | INTRAVENOUS | Status: AC
  Administered 2013-11-18: 400 mg via INTRAVENOUS
  Filled 2013-11-18: qty 200

## 2013-11-18 MED ORDER — CIPROFLOXACIN HCL 500 MG PO TABS: 500.0000 mg | ORAL_TABLET | Freq: Two times a day (BID) | ORAL | Status: DC

## 2013-11-18 NOTE — Discharge Instructions (Signed)
Call for a follow up appointment with a Family or Primary Care Provider.  Return to the emergency room if Symptoms worsen.   Take medication as prescribed.  Take Tylenol 650 mg as needed for your fever. Drink plenty of fluids. If you do not have an appetite don't force herself to eat solid foods. Wait until you're appetite returns.

## 2013-11-18 NOTE — ED Provider Notes (Signed)
CSN: [id number]     Arrival date & time 11/18/13  1122 History   First MD Initiated Contact with Patient 11/18/13 1200     Chief Complaint  Patient presents with  . Back Pain  . Diarrhea  . Fever     (Consider location/radiation/quality/duration/timing/severity/associated sxs/prior Treatment) HPI Comments: The patient is a 63 year old male presenting from urgent care chief complaint of fever, headache, diarrhea for 2 days. Patient reports onset of symptoms 2 days ago while on his return flight from [place]. The patient reports a seven-day stay in [place]. The patient states initial symptoms included chills and mid back pain. He reports generalized abdominal discomfort and diarrhea since last night. He describes watery stools approximately 4 episodes, nonbloody no pus, denies melanotic stool. The patient reports last antipyretic this morning he, 400 mg ibuprofen. He also put reports poison ivy on the left wrist and left knee. He reports similar rash with other members of the group. Does not know the other members have other similar symptoms of fever, chills, headache, diarrhea. PCP: Nadean Corwin, MD   Patient is a 63 y.o. male presenting with back pain, diarrhea, and fever. The history is provided by the patient and the spouse. No language interpreter was used.  Back Pain Associated symptoms: abdominal pain, fever and headaches   Associated symptoms: no dysuria, no numbness and no weakness   Diarrhea Associated symptoms: abdominal pain, chills, fever and headaches   Associated symptoms: no vomiting   Fever Associated symptoms: chills, diarrhea, headaches, nausea and rash   Associated symptoms: no dysuria and no vomiting     Past Medical History  Diagnosis Date  . Hearing aid worn   . Arthritis   . Cataract    Past Surgical History  Procedure Laterality Date  . Eye surgery    . Pancreas surgery     No family history on file. History  Substance Use Topics  . Smoking  status: Former Smoker    Quit date: 05/13/1998  . Smokeless tobacco: Not on file  . Alcohol Use: No    Review of Systems  Constitutional: Positive for fever and chills.  Gastrointestinal: Positive for nausea, abdominal pain and diarrhea. Negative for vomiting, constipation, blood in stool and abdominal distention.  Genitourinary: Negative for dysuria and hematuria.  Musculoskeletal: Positive for back pain.  Skin: Positive for rash.  Neurological: Positive for headaches. Negative for weakness and numbness.      Allergies  Codeine  Home Medications   Prior to Admission medications   Medication Sig Start Date End Date Taking? Authorizing Provider  aspirin 81 MG tablet Take 81 mg by mouth daily.   Yes Historical Provider, MD  Cholecalciferol (VITAMIN D PO) Take 5,000 Units by mouth daily.   Yes Historical Provider, MD  Flaxseed, Linseed, (FLAXSEED OIL PO) Take by mouth. Takes 1 daily   Yes Historical Provider, MD  Omega-3 Fatty Acids (FISH OIL PO) Take by mouth daily.   Yes Historical Provider, MD   BP 132/72  Pulse 94  Temp(Src) 101.4 F (38.6 C)  Resp 20  Ht 6' (1.829 m)  Wt 202 lb (91.627 kg)  BMI 27.39 kg/m2  SpO2 99% Physical Exam  Nursing note and vitals reviewed. Constitutional: He is oriented to person, place, and time. He appears well-developed and well-nourished.  Non-toxic appearance. He does not have a sickly appearance. He does not appear ill. No distress.  HENT:  Head: Normocephalic and atraumatic.  Mouth/Throat: Uvula is midline. Mucous membranes are dry.  Eyes: Conjunctivae and EOM are normal. Pupils are equal, round, and reactive to light. No scleral icterus.  Neck: Normal range of motion. Neck supple.  Cardiovascular: Normal rate and regular rhythm.   Pulmonary/Chest: Effort normal and breath sounds normal. No respiratory distress. He has no wheezes. He has no rales.  Abdominal: Soft. He exhibits no distension. There is tenderness. There is no rebound, no  guarding and no CVA tenderness.  Large well healed scar extending from right upper quadrant and left upper quadrant. Mild generalized abdominal discomfort with palpation.  Musculoskeletal: Normal range of motion.  Neurological: He is alert and oriented to person, place, and time.  Skin: Skin is warm and dry. Rash noted. He is not diaphoretic.  Patchy vesicular rash to left wrist and left knee with erythema and partial scabbing. No signs of infection.  Psychiatric: He has a normal mood and affect. His behavior is normal.    ED Course  Procedures (including critical care time) Labs Review Results for orders placed during the hospital encounter of 11/18/13  CBC WITH DIFFERENTIAL      Result Value Ref Range   WBC 14.1 (*) 4.0 - 10.5 K/uL   RBC 5.15  4.22 - 5.81 MIL/uL   Hemoglobin 16.0  13.0 - 17.0 g/dL   HCT 40.9  81.1 - 91.4 %   MCV 84.7  78.0 - 100.0 fL   MCH 31.1  26.0 - 34.0 pg   MCHC 36.7 (*) 30.0 - 36.0 g/dL   RDW 78.2  95.6 - 21.3 %   Platelets 182  150 - 400 K/uL   Neutrophils Relative % 89 (*) 43 - 77 %   Neutro Abs 12.5 (*) 1.7 - 7.7 K/uL   Lymphocytes Relative 5 (*) 12 - 46 %   Lymphs Abs 0.8  0.7 - 4.0 K/uL   Monocytes Relative 5  3 - 12 %   Monocytes Absolute 0.8  0.1 - 1.0 K/uL   Eosinophils Relative 0  0 - 5 %   Eosinophils Absolute 0.0  0.0 - 0.7 K/uL   Basophils Relative 0  0 - 1 %   Basophils Absolute 0.0  0.0 - 0.1 K/uL  COMPREHENSIVE METABOLIC PANEL      Result Value Ref Range   Sodium 136 (*) 137 - 147 mEq/L   Potassium 3.8  3.7 - 5.3 mEq/L   Chloride 97  96 - 112 mEq/L   CO2 22  19 - 32 mEq/L   Glucose, Bld 121 (*) 70 - 99 mg/dL   BUN 15  6 - 23 mg/dL   Creatinine, Ser 0.86  0.50 - 1.35 mg/dL   Calcium 9.7  8.4 - 57.8 mg/dL   Total Protein 6.5  6.0 - 8.3 g/dL   Albumin 3.7  3.5 - 5.2 g/dL   AST 25  0 - 37 U/L   ALT 28  0 - 53 U/L   Alkaline Phosphatase 54  39 - 117 U/L   Total Bilirubin 1.3 (*) 0.3 - 1.2 mg/dL   GFR calc non Af Amer 78 (*) >90  mL/min   GFR calc Af Amer >90  >90 mL/min   Anion gap 17 (*) 5 - 15  URINALYSIS, ROUTINE W REFLEX MICROSCOPIC      Result Value Ref Range   Color, Urine AMBER (*) YELLOW   APPearance CLEAR  CLEAR   Specific Gravity, Urine 1.021  1.005 - 1.030   pH 5.5  5.0 - 8.0   Glucose, UA NEGATIVE  NEGATIVE mg/dL   Hgb urine dipstick TRACE (*) NEGATIVE   Bilirubin Urine NEGATIVE  NEGATIVE   Ketones, ur 15 (*) NEGATIVE mg/dL   Protein, ur NEGATIVE  NEGATIVE mg/dL   Urobilinogen, UA 0.2  0.0 - 1.0 mg/dL   Nitrite NEGATIVE  NEGATIVE   Leukocytes, UA NEGATIVE  NEGATIVE  LIPASE, BLOOD      Result Value Ref Range   Lipase 11  11 - 59 U/L  URINE MICROSCOPIC-ADD ON      Result Value Ref Range   Squamous Epithelial / LPF RARE  RARE   WBC, UA 3-6  <3 WBC/hpf   RBC / HPF 3-6  <3 RBC/hpf   Bacteria, UA MANY (*) RARE   Ct Abdomen Pelvis W Contrast  11/18/2013   CLINICAL DATA:  Chills aches and back pain with headache and nausea and diarrhea; [place]  EXAM: CT ABDOMEN AND PELVIS WITH CONTRAST  TECHNIQUE: Multidetector CT imaging of the abdomen and pelvis was performed using the standard protocol following bolus administration of intravenous contrast.  CONTRAST:  50mL OMNIPAQUE IOHEXOL 300 MG/ML SOLN orally, 100mL OMNIPAQUE IOHEXOL 300 MG/ML SOLN intravenously  COMPARISON:  Abdominal pelvic CT scan of March 24, 2009.  FINDINGS: There is small hiatal hernia. The stomach and small bowel are normal. There is mild wall thickening throughout portions of the transverse and descending colon. Contrast has not yet reached the colon, but there is a moderate amount of fluid and gas within the colonic lumen. The appendix is not discretely demonstrated; the terminal ileum is normal. The rectosigmoid is normal.  The gallbladder is surgically absent. The liver, pancreas, spleen, adrenal glands, and kidneys are normal. The abdominal aorta is normal. There is no inguinal nor umbilical hernia. There is no  ascites nor intra-abdominal abscess or free air. The prostate gland, seminal vesicles, and urinary bladder exhibit no acute abnormalities. There is mild prostatic enlargement.  The lung bases are clear. The lumbar spine and bony pelvis exhibit no acute abnormalities.  IMPRESSION: 1. Transverse colonic wall thickening is consistent with colitis either infectious or inflammatory. There is no evidence of obstruction, perforation, or abscess formation. 2. There is no acute hepatobiliary or urinary tract abnormality.   Electronically Signed   By: David  [place]azil[place]   On: 11/18/2013 13:59      MDM   Final diagnoses:  Colitis  Fever and chills   The presents with fever, chills, diarrhea after visit to [place]. Nontoxic appearing in ED.  Rash consistent with contact dermatitis from poison ivy. Will evaluate for intra-abdominal etiology.  CBC shows leukocytosis 14.1 with left shift. BMP shows slightly elevated bilirubin at 1.3. CT shows colitis, discussed with Dr. Sherrie Sportplana who also evaluated the patient during this encounter and advises treat for possible infectious with Cipro and Flagyl. 1405 evaluation patient reports resolution of abdominal discomfort, fever, headache discuss CT results and treatment plan the patient and patient's wife. Reevaluation patient sleeping in room patient's wife reports patient is unable to sleep in ED. Discussed lab results, imaging results, and treatment plan with the patient. Return precautions given. Reports understanding and no other concerns at this time.  Patient is stable for discharge at this time.  Meds given in ED:  Medications  sodium chloride 0.9 % bolus 1,000 mL (0 mLs Intravenous Stopped 11/18/13 1303)  acetaminophen (TYLENOL) tablet 650 mg (650 mg Oral Given 11/18/13 1234)  ondansetron (ZOFRAN) injection 4 mg (4 mg Intravenous Given 11/18/13 1234)  iohexol (OMNIPAQUE) 300 MG/ML solution  100 mL (100 mLs Intravenous Contrast Given 11/18/13 1332)  iohexol (OMNIPAQUE) 300  MG/ML solution 50 mL (50 mLs Oral Contrast Given 11/18/13 1332)  ciprofloxacin (CIPRO) IVPB 400 mg (0 mg Intravenous Stopped 11/18/13 1636)  metroNIDAZOLE (FLAGYL) IVPB 500 mg (0 mg Intravenous Stopped 11/18/13 1559)    Discharge Medication List as of 11/18/2013  5:16 PM    START taking these medications   Details  acetaminophen 325 MG tablet Take 2 tablets (650 mg total) by mouth every 6 (six) hours as needed., Starting 11/18/2013, Until Discontinued, Print    ciprofloxacin (CIPRO) 500 MG tablet Take 1 tablet (500 mg total) by mouth 2 (two) times daily., Starting 11/18/2013, Until Discontinued, Print    metroNIDAZOLE (FLAGYL) 500 MG tablet Take 1 tablet (500 mg total) by mouth 2 (two) times daily., Starting 11/18/2013, Until Discontinued, Print          Clabe Seal, PA-C 11/18/13 2137

## 2013-11-18 NOTE — Patient Instructions (Signed)
  Contact Dermatitis Contact dermatitis is a rash that happens when something touches the skin. You touched something that irritates your skin, or you have allergies to something you touched. HOME CARE   Avoid the thing that caused your rash.  Keep your rash away from hot water, soap, sunlight, chemicals, and other things that might bother it.  Do not scratch your rash.  You can take cool baths to help stop itching.  Only take medicine as told by your doctor.  Keep all doctor visits as told. GET HELP RIGHT AWAY IF:   Your rash is not better after 3 days.  Your rash gets worse.  Your rash is puffy (swollen), tender, red, sore, or warm.  You have problems with your medicine. MAKE SURE YOU:   Understand these instructions.  Will watch your condition.  Will get help right away if you are not doing well or get worse. Document Released: 02/24/2009 Document Revised: 07/22/2011 Document Reviewed: 10/02/2010 [facility] Patient Information 2015 [place], [place]. This information is not intended to replace advice given to you by your health care provider. Make sure you discuss any questions you have with your health care provider. Dehydration, Adult Dehydration means your body does not have as much fluid as it needs. Your kidneys, brain, and heart will not work properly without the right amount of fluids and salt.  HOME CARE  Ask your doctor how to replace body fluid losses (rehydrate).  Drink enough fluids to keep your pee (urine) clear or pale yellow.  Drink small amounts of fluids often if you feel sick to your stomach (nauseous) or throw up (vomit).  Eat like you normally do.  Avoid:  Foods or drinks high in sugar.  Bubbly (carbonated) drinks.  Juice.  Very hot or cold fluids.  Drinks with caffeine.  Fatty, greasy foods.  Alcohol.  Tobacco.  Eating too much.  Gelatin desserts.  Wash your hands to avoid spreading germs (bacteria, viruses).  Only take medicine  as told by your doctor.  Keep all doctor visits as told. GET HELP RIGHT AWAY IF:   You cannot drink something without throwing up.  You get worse even with treatment.  Your vomit has blood in it or looks greenish.  Your poop (stool) has blood in it or looks black and tarry.  You have not peed in 6 to 8 hours.  You pee a small amount of very dark pee.  You have a fever.  You pass out (faint).  You have belly (abdominal) pain that gets worse or stays in one spot (localizes).  You have a rash, stiff neck, or bad headache.  You get easily annoyed, sleepy, or are hard to wake up.  You feel weak, dizzy, or very thirsty. MAKE SURE YOU:   Understand these instructions.  Will watch your condition.  Will get help right away if you are not doing well or get worse. Document Released: 02/23/2009 Document Revised: 07/22/2011 Document Reviewed: 12/17/2010 [facility] Patient Information 2015 [place], [place]. This information is not intended to replace advice given to you by your health care provider. Make sure you discuss any questions you have with your health care provider.

## 2013-11-18 NOTE — Addendum Note (Signed)
Addended by: Jahlisa Rossitto A on: 11/18/2013 11:06 AM   Modules accepted: Orders

## 2013-11-18 NOTE — ED Notes (Signed)
Pt was on [place] trip to [place].  Pt started to have fever, chills on Tuesday.  Pt started feeling worse over time, diarrhea started last night with weakness, headache and nausea.  Does not recall being bit by mosquito.  Does have some poison oak.

## 2013-11-18 NOTE — ED Notes (Signed)
EDPA notified pt was incontinent of diarrheal stool and no sample was obtained

## 2013-11-18 NOTE — Progress Notes (Signed)
   Subjective:    Patient ID: Perry Evans, male    DOB: 04/21/1951, 63 y.o.   MRN: [id number]  HPI Comments: 63 yo WM recently went [place] and on return trip home started feeling feverish/ aches. He has felt nauseated with diarrhea today. He has had HA and back pain that has increased. He is not eating or drinking as good as he should be.   He notes he also got poison IVY and has been using topical. He notes + itch.   Fever  Associated symptoms include diarrhea, headaches, nausea and a rash.  Dizziness Associated symptoms include a fever, headaches, nausea and a rash.     Medication List       This list is accurate as of: 11/18/13 10:49 AM.  Always use your most recent med list.               aspirin 81 MG tablet  Take 81 mg by mouth daily.     FISH OIL PO  Take by mouth daily.     FLAXSEED OIL PO  Take by mouth. Takes 1 daily     VITAMIN D PO  Take 5,000 Units by mouth daily.       Allergies  Allergen Reactions  . Codeine    Past Medical History  Diagnosis Date  . Hearing aid worn   . Arthritis   . Cataract       Review of Systems  Constitutional: Positive for fever.  Gastrointestinal: Positive for nausea and diarrhea.  Musculoskeletal: Positive for back pain.  Skin: Positive for rash.  Neurological: Positive for dizziness and headaches.  All other systems reviewed and are negative.      Objective:   Physical Exam  Constitutional:  Appears weak  HENT:  Head: Normocephalic.  Mouth/Throat: Oropharynx is clear and moist.  Cardiovascular: Normal rate, regular rhythm and normal heart sounds.   Pulmonary/Chest: Effort normal and breath sounds normal.  Abdominal: Soft. Bowel sounds are normal. There is no tenderness.  Musculoskeletal: Normal range of motion.  Neurological: He is alert.  Skin:  Decreased turgor          Assessment & Plan:  1. ? Dehydration vs viral- Refer ER for evaluation  2. Rhus dermatitis- Decadron 10 mg IM,  topical cortisone, OTC Zyrtec, Hygiene discussed

## 2013-11-18 NOTE — ED Notes (Signed)
Patient aware of needed stool sample, patient states he is unable to provide one at this time.

## 2013-11-22 NOTE — ED Provider Notes (Signed)
Medical screening examination/treatment/procedure(s) were conducted as a shared visit with non-physician practitioner(s) and myself.  I personally evaluated the patient during the encounter.   EKG Interpretation None     Presents with complaints of diarrhea, fever, abdominal pain. Patient feeling much better after medication fluid. Examination reveals tenderness in the lower quadrant without peritoneal signs. CT scan confirms colitis, likely infectious. Patient initiated on antibiotic coverage, appropriate for discharge and continued treatment, outpatient followup.  Gilda Creasehristopher J. Pollina, MD 11/22/13 1535

## 2013-11-24 LAB — CULTURE, BLOOD (ROUTINE X 2)
Culture: NO GROWTH
Culture: NO GROWTH

## 2014-06-07 ENCOUNTER — Encounter: Payer: Self-pay | Admitting: Internal Medicine

## 2014-07-19 ENCOUNTER — Ambulatory Visit (INDEPENDENT_AMBULATORY_CARE_PROVIDER_SITE_OTHER): Payer: BC Managed Care – PPO | Admitting: Internal Medicine

## 2014-07-19 ENCOUNTER — Encounter: Payer: Self-pay | Admitting: Internal Medicine

## 2014-07-19 VITALS — BP 110/72 | HR 76 | Temp 97.5°F | Resp 16 | Ht 71.0 in | Wt 204.6 lb

## 2014-07-19 DIAGNOSIS — E559 Vitamin D deficiency, unspecified: Secondary | ICD-10-CM

## 2014-07-19 DIAGNOSIS — Z111 Encounter for screening for respiratory tuberculosis: Secondary | ICD-10-CM

## 2014-07-19 DIAGNOSIS — R7309 Other abnormal glucose: Secondary | ICD-10-CM

## 2014-07-19 DIAGNOSIS — Z1212 Encounter for screening for malignant neoplasm of rectum: Secondary | ICD-10-CM

## 2014-07-19 DIAGNOSIS — R7303 Prediabetes: Secondary | ICD-10-CM

## 2014-07-19 DIAGNOSIS — E782 Mixed hyperlipidemia: Secondary | ICD-10-CM

## 2014-07-19 DIAGNOSIS — Z79899 Other long term (current) drug therapy: Secondary | ICD-10-CM | POA: Insufficient documentation

## 2014-07-19 DIAGNOSIS — I10 Essential (primary) hypertension: Secondary | ICD-10-CM

## 2014-07-19 DIAGNOSIS — Z125 Encounter for screening for malignant neoplasm of prostate: Secondary | ICD-10-CM

## 2014-07-19 DIAGNOSIS — R5383 Other fatigue: Secondary | ICD-10-CM

## 2014-07-19 LAB — CBC WITH DIFFERENTIAL/PLATELET
Basophils Absolute: 0.1 K/uL (ref 0.0–0.1)
Basophils Relative: 1 % (ref 0–1)
Eosinophils Absolute: 0.2 K/uL (ref 0.0–0.7)
Eosinophils Relative: 3 % (ref 0–5)
HCT: 47.3 % (ref 39.0–52.0)
Hemoglobin: 16.6 g/dL (ref 13.0–17.0)
Lymphocytes Relative: 32 % (ref 12–46)
Lymphs Abs: 2.6 K/uL (ref 0.7–4.0)
MCH: 31.1 pg (ref 26.0–34.0)
MCHC: 35.1 g/dL (ref 30.0–36.0)
MCV: 88.6 fL (ref 78.0–100.0)
MPV: 9.8 fL (ref 8.6–12.4)
Monocytes Absolute: 0.7 K/uL (ref 0.1–1.0)
Monocytes Relative: 8 % (ref 3–12)
Neutro Abs: 4.6 K/uL (ref 1.7–7.7)
Neutrophils Relative %: 56 % (ref 43–77)
Platelets: 253 K/uL (ref 150–400)
RBC: 5.34 MIL/uL (ref 4.22–5.81)
RDW: 13.6 % (ref 11.5–15.5)
WBC: 8.2 K/uL (ref 4.0–10.5)

## 2014-07-19 NOTE — Progress Notes (Signed)
Patient ID: Perry Evans, male   DOB: 06-01-1950, 64 y.o.   MRN: [id number] Annual Comprehensive Examination  This very nice 64 y.o. MWM presents for complete physical.  Patient has been followed for labile HTN, screened for Prediabetes, Hyperlipidemia, and Vitamin D Deficiency.   HTN predates since 2007 and was started on Atenolol which he subsequently self- discontinued and has since been monitored expectantly. Patient's BP has been controlled at home.Today's BP was  110/72 mmHg. Patient denies any cardiac symptoms as chest pain, palpitations, shortness of breath, dizziness or ankle swelling.   Patient's hyperlipidemia is controlled with diet. Last lipids were at goal as below.  Lab Results  Component Value Date   CHOL 172 06/03/2013   HDL 44 06/03/2013   LDLCALC 102* 06/03/2013   TRIG 130 06/03/2013   CHOLHDL 3.9 06/03/2013    Patient is screened for prediabetes and patient denies reactive hypoglycemic symptoms, visual blurring, diabetic polys or paresthesias. Last A1c was 5.4% in Jan 2015.    Patient has hx/o consistently low Testosterone levels since 2012 with values as low as 132 x 2 and 188 and 250. He has declined therapy . Finally, patient has history of Vitamin D Deficiency and last vitamin D was  50 in Jan 2014.  Medication Sig  . aspirin 81 MG tablet Take 81 mg by mouth daily.  Marland Kitchen VITAMIN D  Take 5,000 Units by mouth daily.  Marland Kitchen FLAXSEED OIL  Take by mouth. Takes 1 daily  . acetaminophen 325 MG tablet Take 2 tablets (650 mg total) by mouth every 6 (six) hours as needed.  . Omega-3 Fatty Acids (FISH OIL PO) Take by mouth daily.   Allergies  Allergen Reactions  . Codeine    Past Medical History  Diagnosis Date  . Hearing aid worn   . Arthritis   . Cataract    Health Maintenance  Topic Date Due  . COLONOSCOPY  09/14/2000  . ZOSTAVAX  09/15/2010  . INFLUENZA VACCINE  12/11/2013  . TETANUS/TDAP  08/27/2018  . HIV Screening  Completed   Immunization History  Administered  Date(s) Administered  . DT 05/13/1996  . Hepatitis A 05/13/2009  . PPD Test 06/03/2013  . Tdap 08/26/2008   Past Surgical History  Procedure Laterality Date  . Eye surgery    . Pancreas surgery     History   Social History  . Marital Status: Married    Spouse Name: N/A  . Number of Children: N/A  . Years of Education: N/A   Occupational History  .  works for [employer].   Social History Main Topics  . Smoking status: Former Smoker    Quit date: 05/13/1998  . Smokeless tobacco: Not on file  . Alcohol Use: No  . Drug Use: Not on file  . Sexual Activity: Not on file   Social History Narrative   Employment: works in Teacher, English as a foreign language for [employer].    ROS Constitutional: Denies fever, chills, weight loss/gain, headaches, insomnia, fatigue, night sweats or change in appetite. Eyes: Denies redness, blurred vision, diplopia, discharge, itchy or watery eyes.  ENT: Denies discharge, congestion, post nasal drip, epistaxis, sore throat, earache, hearing loss, dental pain, Tinnitus, Vertigo, Sinus pain or snoring.  Cardio: Denies chest pain, palpitations, irregular heartbeat, syncope, dyspnea, diaphoresis, orthopnea, PND, claudication or edema Respiratory: denies cough, dyspnea, DOE, pleurisy, hoarseness, laryngitis or wheezing.  Gastrointestinal: Denies dysphagia, heartburn, reflux, water brash, pain, cramps, nausea, vomiting, bloating, diarrhea, constipation, hematemesis, melena, hematochezia, jaundice or hemorrhoids  Genitourinary: Denies dysuria, frequency, urgency, nocturia, hesitancy, discharge, hematuria or flank pain Musculoskeletal: Denies arthralgia, myalgia, stiffness, Jt. Swelling, pain, limp or strain/sprain. Denies Falls. Skin: Denies puritis, rash, hives, warts, acne, eczema or change in skin lesion Neuro: No weakness, tremor, incoordination, spasms, paresthesia or pain Psychiatric: Denies confusion, memory loss or sensory loss. Denies  Depression. Endocrine: Denies change in weight, skin, hair change, nocturia, and paresthesia, diabetic polys, visual blurring or hyper / hypo glycemic episodes.  Heme/Lymph: No excessive bleeding, bruising or enlarged lymph nodes.  Physical Exam  BP 110/72   Pulse 76  Temp 97.5 F   Resp 16  Ht 5\' 11"    Wt 204 lb 9.6 oz    BMI 28.55   General Appearance: Well nourished, in no apparent distress. Eyes: PERRLA, EOMs, conjunctiva no swelling or erythema, normal fundi and vessels. Sinuses: No frontal/maxillary tenderness ENT/Mouth: EACs patent / TMs  nl. Nares clear without erythema, swelling, mucoid exudates. Oral hygiene is good. No erythema, swelling, or exudate. Tongue normal, non-obstructing. Tonsils not swollen or erythematous. Hearing normal.  Neck: Supple, thyroid normal. No bruits, nodes or JVD. Respiratory: Respiratory effort normal.  BS equal and clear bilateral without rales, rhonci, wheezing or stridor. Cardio: Heart sounds are normal with regular rate and rhythm and no murmurs, rubs or gallops. Peripheral pulses are normal and equal bilaterally without edema. No aortic or femoral bruits. Chest: symmetric with normal excursions and percussion.  Abdomen: Flat, soft, with bowl sounds. Nontender, no guarding, rebound, hernias, masses, or organomegaly.  Lymphatics: Non tender without lymphadenopathy.  Genitourinary: No hernias.Testes nl. DRE - prostate nl for age - smooth & firm w/o nodules. Musculoskeletal: Full ROM all peripheral extremities, joint stability, 5/5 strength, and normal gait. Skin: Warm and dry without rashes, lesions, cyanosis, clubbing or  ecchymosis.  Neuro: Cranial nerves intact, reflexes equal bilaterally. Normal muscle tone, no cerebellar symptoms. Sensation intact.  Pysch: Awake and oriented X 3 with normal affect, insight and judgment appropriate.   Assessment and Plan  1. Essential hypertension  - EKG 12-Lead - US, RETROPERITNL ABD,  LTD  2. Mixed  hyperlipidemia  - Lipid panel  3. Prediabetes  - Hemoglobin A1c - Insulin, fasting  4. Vitamin D deficiency  - Vit D  25 hydroxy   5. Screening for rectal cancer  - POC Hemoccult Bld  6. Prostate cancer screening  - PSA  7. Other fatigue  - Vitamin B12 - Testosterone - Iron and TIBC - TSH  8. Medication management  - CBC with Differential/Platelet - BASIC METABOLIC PANEL WITH GFR - Hepatic function panel - Magnesium   Continue prudent diet as discussed, weight control, BP monitoring, regular exercise, and medications as discussed.  Discussed med effects and SE's. Routine screening labs and tests as requested with regular follow-up as recommended.

## 2014-07-19 NOTE — Patient Instructions (Signed)

## 2014-07-20 LAB — HEMOGLOBIN A1C
HEMOGLOBIN A1C: 5.5 % (ref <5.7)
MEAN PLASMA GLUCOSE: 111 mg/dL (ref <117)

## 2014-07-20 LAB — HEPATIC FUNCTION PANEL
ALT: 29 U/L (ref 0–53)
AST: 26 U/L (ref 0–37)
Albumin: 4.5 g/dL (ref 3.5–5.2)
Alkaline Phosphatase: 58 U/L (ref 39–117)
BILIRUBIN DIRECT: 0.1 mg/dL (ref 0.0–0.3)
BILIRUBIN INDIRECT: 0.5 mg/dL (ref 0.2–1.2)
Total Bilirubin: 0.6 mg/dL (ref 0.2–1.2)
Total Protein: 6.5 g/dL (ref 6.0–8.3)

## 2014-07-20 LAB — BASIC METABOLIC PANEL WITH GFR
BUN: 20 mg/dL (ref 6–23)
CALCIUM: 9.8 mg/dL (ref 8.4–10.5)
CO2: 27 mEq/L (ref 19–32)
CREATININE: 0.91 mg/dL (ref 0.50–1.35)
Chloride: 104 mEq/L (ref 96–112)
GFR, EST NON AFRICAN AMERICAN: 89 mL/min
Glucose, Bld: 93 mg/dL (ref 70–99)
Potassium: 4.2 mEq/L (ref 3.5–5.3)
Sodium: 140 mEq/L (ref 135–145)

## 2014-07-20 LAB — IRON AND TIBC
%SAT: 23 % (ref 20–55)
Iron: 66 ug/dL (ref 42–165)
TIBC: 288 ug/dL (ref 215–435)
UIBC: 222 ug/dL (ref 125–400)

## 2014-07-20 LAB — TESTOSTERONE: Testosterone: 190 ng/dL — ABNORMAL LOW (ref 300–890)

## 2014-07-20 LAB — INSULIN, FASTING: Insulin fasting, serum: 6 u[IU]/mL (ref 2.0–19.6)

## 2014-07-20 LAB — LIPID PANEL
Cholesterol: 135 mg/dL (ref 0–200)
HDL: 37 mg/dL — AB (ref >=40)
LDL Cholesterol: 73 mg/dL (ref 0–99)
Total CHOL/HDL Ratio: 3.6 Ratio
Triglycerides: 123 mg/dL (ref <150)
VLDL: 25 mg/dL (ref 0–40)

## 2014-07-20 LAB — TSH: TSH: 2.246 u[IU]/mL (ref 0.350–4.500)

## 2014-07-20 LAB — PSA: PSA: 0.72 ng/mL (ref <=4.00)

## 2014-07-20 LAB — VITAMIN D 25 HYDROXY (VIT D DEFICIENCY, FRACTURES): Vit D, 25-Hydroxy: 44 ng/mL (ref 30–100)

## 2014-07-20 LAB — MAGNESIUM: MAGNESIUM: 2 mg/dL (ref 1.5–2.5)

## 2014-07-20 LAB — VITAMIN B12: Vitamin B-12: 1318 pg/mL — ABNORMAL HIGH (ref 211–911)

## 2014-07-20 NOTE — Addendum Note (Signed)
Addended by: Valrie HartEVANS, Armanda Forand C on: 07/20/2014 08:23 AM   Modules accepted: Orders

## 2014-09-06 LAB — TB SKIN TEST
INDURATION: 0 mm
TB Skin Test: NEGATIVE

## 2015-01-31 ENCOUNTER — Ambulatory Visit: Payer: Self-pay | Admitting: Internal Medicine

## 2015-04-04 ENCOUNTER — Ambulatory Visit (INDEPENDENT_AMBULATORY_CARE_PROVIDER_SITE_OTHER): Payer: BC Managed Care – PPO | Admitting: Internal Medicine

## 2015-04-04 ENCOUNTER — Encounter: Payer: Self-pay | Admitting: Internal Medicine

## 2015-04-04 VITALS — BP 106/64 | HR 76 | Temp 97.9°F | Resp 16 | Ht 71.0 in | Wt 204.0 lb

## 2015-04-04 DIAGNOSIS — J069 Acute upper respiratory infection, unspecified: Secondary | ICD-10-CM

## 2015-04-04 MED ORDER — AZITHROMYCIN 250 MG PO TABS: ORAL_TABLET | ORAL | Status: DC

## 2015-04-04 MED ORDER — PREDNISONE 20 MG PO TABS: ORAL_TABLET | ORAL | Status: DC

## 2015-04-04 NOTE — Progress Notes (Signed)
Patient ID: Arlester MarkerDaniel Liebert, male   DOB: 04/01/1951, 64 y.o.   MRN: [id number]  HPI  Patient presents to the office for evaluation of cough.  It has been going on for 1 weeks.  Patient reports night > day, wet, worse with lying down.  They also endorse change in voice, postnasal drip, shortness of breath, sputum production and sinus pressure, ear pressure, nasal congestion, sore throat. Itchy watery eyes and sneezing.  .  They have tried mucinex dm.  They report that nothing has worked.  They admits to other sick contacts. Wife had similar symptoms couple weeks ago.    Review of Systems  Constitutional: Negative for fever, chills and malaise/fatigue.  HENT: Positive for congestion, ear pain and sore throat.   Respiratory: Positive for cough, sputum production and shortness of breath. Negative for wheezing.   Cardiovascular: Negative for chest pain, palpitations and leg swelling.  Neurological: Positive for headaches.    PE:  General:  Alert and non-toxic, WDWN, NAD HEENT: NCAT, PERLA, EOM normal, no occular discharge or erythema.  Nasal mucosal edema with sinus tenderness to palpation.  Oropharynx clear with minimal oropharyngeal edema and erythema.  Mucous membranes moist and pink. Neck:  Cervical adenopathy Chest:  RRR no MRGs.  Lungs clear to auscultation A&P with no wheezes rhonchi or rales.   Abdomen: +BS x 4 quadrants, soft, non-tender, no guarding, rigidity, or rebound. Skin: warm and dry no rash Neuro: A&Ox4, CN II-XII grossly intact  Assessment and Plan:   1. Acute URI -nasal saline -claritin -flonase - azithromycin (ZITHROMAX Z-PAK) 250 MG tablet; 2 po day one, then 1 daily x 4 days  Dispense: 5 tablet; Refill: 0 - predniSONE (DELTASONE) 20 MG tablet; 3 tabs po day one, then 2 tabs daily x 4 days  Dispense: 11 tablet; Refill: 0

## 2015-04-04 NOTE — Patient Instructions (Signed)
Please start taking your claritin daily again.  You can continue to take your mucinex twice a day.  Please use saline in your nose to rinse it out and thin out mucous as much as possible.  Take the zpak until it is gone.  Take the prednisone until it is gone.  Please use flonase 2 sprays in each nostril right before bedtime.

## 2015-04-10 ENCOUNTER — Other Ambulatory Visit: Payer: Self-pay | Admitting: Internal Medicine

## 2015-04-10 MED ORDER — PREDNISONE 20 MG PO TABS: ORAL_TABLET | ORAL | Status: DC

## 2015-04-10 NOTE — Progress Notes (Signed)
Patient aware.  Advised OV if no relief with symptoms.

## 2015-05-09 ENCOUNTER — Encounter: Payer: Self-pay | Admitting: Internal Medicine

## 2015-05-09 ENCOUNTER — Ambulatory Visit (INDEPENDENT_AMBULATORY_CARE_PROVIDER_SITE_OTHER): Payer: BC Managed Care – PPO | Admitting: Internal Medicine

## 2015-05-09 VITALS — BP 138/74 | HR 84 | Temp 97.7°F | Resp 16 | Ht 71.0 in | Wt 210.0 lb

## 2015-05-09 DIAGNOSIS — J0100 Acute maxillary sinusitis, unspecified: Secondary | ICD-10-CM

## 2015-05-09 MED ORDER — IPRATROPIUM BROMIDE 0.03 % NA SOLN: 2.0000 | Freq: Three times a day (TID) | NASAL | Status: DC

## 2015-05-09 MED ORDER — PREDNISONE 20 MG PO TABS: ORAL_TABLET | ORAL | Status: DC

## 2015-05-09 MED ORDER — PSEUDOEPHEDRINE HCL 60 MG PO TABS: ORAL_TABLET | ORAL | Status: DC

## 2015-05-09 NOTE — Patient Instructions (Addendum)
Rhinitis  Rhinitis is when the mucous membranes in the nose respond with  frequent sneezing, congestion, and an itchy, runny nose.   CAUSES Viruses  SYMPTOMS  Nasal stuffiness (congestion).  Itchy, runny nose with sneezing and tearing of the eyes.  Treated with antihistamines in pill or nasal spray forms. Antihistamines block the effects of histamine. There are over-the-counter medicines that may help with nasal congestion and swelling around the eyes.  lergens, but you can reduce your symptoms by taking steps to limit your exposure to them. It helps to know exactly what you are allergic to so that you can avoid your specific triggers.

## 2015-05-09 NOTE — Progress Notes (Signed)
  Subjective:    Patient ID: Perry Evans, male    DOB: 03/19/1951, 64 y.o.   MRN: [id number]  HPI Nice 64 yo MWM with 1-2 week prodrome of sinus pressur /pain, congestion, itchy watery eyes & nose & sneezing. No fever, chills, chest congestion.   Medication Sig  . aspirin 81 MG tablet Take 81 mg by mouth daily.  Marland Kitchen. VITAMIN D  Take 5,000 Units by mouth daily.  Marland Kitchen. FLAXSEED OIL  Take by mouth. Takes 1 daily  . Multiple Vitamins-Minerals  Take 1 tablet by mouth daily.   Allergies  Allergen Reactions  . Codeine    Past Medical History  Diagnosis Date  . Hearing aid worn   . Arthritis   . Cataract    Review of Systems  10 point systems review negative except as above.    Objective:   Physical Exam  BP 138/74 mmHg  Pulse 84  Temp(Src) 97.7 F (36.5 C)  Resp 16  Ht 5\' 11"  (1.803 m)  Wt 210 lb (95.255 kg)  BMI 29.30 kg/m2  HEENT - Eac's patent. TM's retracted. EOM's full. PERRLA. NasoOroPharynx 2+ injected w/o exudates. Sl fronto/maxillary tenderness. Watery nasal secretions.  Neck - supple. Nl Thyroid. Carotids 2+ & No bruits, nodes, JVD Chest - Clear equal BS w/o Rales, rhonchi, wheezes. Cor - Nl HS. RRR w/o sig MGR. PP 1(+). No edema. Abd - No palpable organomegaly, masses or tenderness. BS nl. MS- FROM w/o deformities. Muscle power, tone and bulk Nl. Gait Nl. Neuro - No focal neuro abnormalities    Assessment & Plan:   1. Acute Rhino sinusitis - viral   - predniSONE (DELTASONE) 20 MG tablet; 1 tab 3 x day for 3 days, then 1 tab 2 x day for 3 days, then 1 tab 1 x day for 5 days  Dispense: 20 tablet; Refill: 0 - ipratropium (ATROVENT) 0.03 % nasal spray; Place 2 sprays into the nose 3 (three) times daily.  Dispense: 30 mL; Refill: 2 - pseudoephedrine (SUDAFED) 60 MG tablet; Take 1 tablet 3 to 4 x day for nasal congestion  Dispense: 60 tablet; Refill: 0  - discussed meds/SE's.

## 2015-06-08 ENCOUNTER — Encounter: Payer: Self-pay | Admitting: Internal Medicine

## 2015-06-27 ENCOUNTER — Encounter: Payer: Self-pay | Admitting: Gastroenterology

## 2015-07-03 ENCOUNTER — Other Ambulatory Visit: Payer: Self-pay | Admitting: Internal Medicine

## 2015-07-03 DIAGNOSIS — J329 Chronic sinusitis, unspecified: Principal | ICD-10-CM

## 2015-07-03 DIAGNOSIS — B9689 Other specified bacterial agents as the cause of diseases classified elsewhere: Secondary | ICD-10-CM

## 2015-07-03 MED ORDER — PREDNISONE 20 MG PO TABS: ORAL_TABLET | ORAL | Status: DC

## 2015-07-03 MED ORDER — AZITHROMYCIN 250 MG PO TABS: ORAL_TABLET | ORAL | Status: DC

## 2015-07-26 ENCOUNTER — Encounter: Payer: Self-pay | Admitting: Internal Medicine

## 2015-08-15 ENCOUNTER — Encounter: Payer: Self-pay | Admitting: Internal Medicine

## 2015-08-15 ENCOUNTER — Ambulatory Visit (INDEPENDENT_AMBULATORY_CARE_PROVIDER_SITE_OTHER): Payer: BC Managed Care – PPO | Admitting: Internal Medicine

## 2015-08-15 VITALS — BP 128/84 | HR 76 | Temp 97.8°F | Resp 16 | Ht 71.0 in | Wt 206.2 lb

## 2015-08-15 DIAGNOSIS — Z79899 Other long term (current) drug therapy: Secondary | ICD-10-CM

## 2015-08-15 DIAGNOSIS — I10 Essential (primary) hypertension: Secondary | ICD-10-CM | POA: Diagnosis not present

## 2015-08-15 DIAGNOSIS — Z0001 Encounter for general adult medical examination with abnormal findings: Secondary | ICD-10-CM

## 2015-08-15 DIAGNOSIS — Z125 Encounter for screening for malignant neoplasm of prostate: Secondary | ICD-10-CM | POA: Diagnosis not present

## 2015-08-15 DIAGNOSIS — Z136 Encounter for screening for cardiovascular disorders: Secondary | ICD-10-CM

## 2015-08-15 DIAGNOSIS — E559 Vitamin D deficiency, unspecified: Secondary | ICD-10-CM | POA: Diagnosis not present

## 2015-08-15 DIAGNOSIS — Z0000 Encounter for general adult medical examination without abnormal findings: Secondary | ICD-10-CM

## 2015-08-15 DIAGNOSIS — Z1212 Encounter for screening for malignant neoplasm of rectum: Secondary | ICD-10-CM

## 2015-08-15 DIAGNOSIS — R7303 Prediabetes: Secondary | ICD-10-CM

## 2015-08-15 DIAGNOSIS — E782 Mixed hyperlipidemia: Secondary | ICD-10-CM

## 2015-08-15 DIAGNOSIS — R5383 Other fatigue: Secondary | ICD-10-CM

## 2015-08-15 LAB — BASIC METABOLIC PANEL WITH GFR
BUN: 21 mg/dL (ref 7–25)
CHLORIDE: 103 mmol/L (ref 98–110)
CO2: 25 mmol/L (ref 20–31)
CREATININE: 1.03 mg/dL (ref 0.70–1.25)
Calcium: 9.6 mg/dL (ref 8.6–10.3)
GFR, Est African American: 88 mL/min (ref >=60)
GFR, Est Non African American: 76 mL/min (ref >=60)
GLUCOSE: 131 mg/dL — AB (ref 65–99)
Potassium: 4.3 mmol/L (ref 3.5–5.3)
SODIUM: 141 mmol/L (ref 135–146)

## 2015-08-15 LAB — CBC WITH DIFFERENTIAL/PLATELET
BASOS ABS: 0 {cells}/uL (ref 0–200)
Basophils Relative: 0 %
EOS ABS: 162 {cells}/uL (ref 15–500)
EOS PCT: 2 %
HEMATOCRIT: 45.2 % (ref 38.5–50.0)
HEMOGLOBIN: 15.6 g/dL (ref 13.2–17.1)
LYMPHS ABS: 2754 {cells}/uL (ref 850–3900)
Lymphocytes Relative: 34 %
MCH: 30.5 pg (ref 27.0–33.0)
MCHC: 34.5 g/dL (ref 32.0–36.0)
MCV: 88.5 fL (ref 80.0–100.0)
MONO ABS: 648 {cells}/uL (ref 200–950)
MPV: 10.5 fL (ref 7.5–12.5)
Monocytes Relative: 8 %
NEUTROS ABS: 4536 {cells}/uL (ref 1500–7800)
NEUTROS PCT: 56 %
Platelets: 237 10*3/uL (ref 140–400)
RBC: 5.11 MIL/uL (ref 4.20–5.80)
RDW: 13.5 % (ref 11.0–15.0)
WBC: 8.1 10*3/uL (ref 3.8–10.8)

## 2015-08-15 LAB — LIPID PANEL
CHOL/HDL RATIO: 4.8 ratio (ref <=5.0)
Cholesterol: 150 mg/dL (ref 125–200)
HDL: 31 mg/dL — ABNORMAL LOW (ref >=40)
LDL Cholesterol: 82 mg/dL (ref <130)
Triglycerides: 186 mg/dL — ABNORMAL HIGH (ref <150)
VLDL: 37 mg/dL — AB (ref <30)

## 2015-08-15 LAB — HEPATIC FUNCTION PANEL
ALT: 22 U/L (ref 9–46)
AST: 22 U/L (ref 10–35)
Albumin: 4.4 g/dL (ref 3.6–5.1)
Alkaline Phosphatase: 66 U/L (ref 40–115)
BILIRUBIN DIRECT: 0.1 mg/dL (ref <=0.2)
Indirect Bilirubin: 0.5 mg/dL (ref 0.2–1.2)
TOTAL PROTEIN: 6.4 g/dL (ref 6.1–8.1)
Total Bilirubin: 0.6 mg/dL (ref 0.2–1.2)

## 2015-08-15 LAB — VITAMIN B12: VITAMIN B 12: 623 pg/mL (ref 200–1100)

## 2015-08-15 LAB — IRON AND TIBC
%SAT: 18 % (ref 15–60)
IRON: 49 ug/dL — AB (ref 50–180)
TIBC: 265 ug/dL (ref 250–425)
UIBC: 216 ug/dL (ref 125–400)

## 2015-08-15 LAB — HEMOGLOBIN A1C
HEMOGLOBIN A1C: 5.3 % (ref <5.7)
MEAN PLASMA GLUCOSE: 105 mg/dL

## 2015-08-15 LAB — MAGNESIUM: MAGNESIUM: 2 mg/dL (ref 1.5–2.5)

## 2015-08-15 LAB — TSH: TSH: 3.1 m[IU]/L (ref 0.40–4.50)

## 2015-08-15 NOTE — Progress Notes (Signed)
Patient ID: Perry MarkerDaniel Evans, male   DOB: 03/30/1951, 65 y.o.   MRN: [id number]  Annual  Screening/Preventative Visit And Comprehensive Evaluation & Examination  This very nice 65 y.o. MWM presents for a Wellness/Preventative Visit & comprehensive evaluation and management of multiple medical co-morbidities.  Patient has been followed for  Labile HTN, screened for Prediabetes, Hyperlipidemia and Vitamin D Deficiency.   Patien has hx/o labile HTN predates since 2007 and he was initially treated and later d/c'd med and has been followed expectantly. Patient's BP has been controlled at home. Today's BP: 128/84 mmHg. Patient denies any cardiac symptoms as chest pain, palpitations, shortness of breath, dizziness or ankle swelling.   Patient's hyperlipidemia is controlled with diet.  Last lipids were at goal with Total Chol 135, HDL 37, Trig 123 and LDL 73.    Patient  Is monitored and screened proactively for prediabetes and he denies reactive hypoglycemic symptoms, visual blurring, diabetic polys or paresthesias. Current  A1c is 5.3% and at goal.   Finally, patient has history of Vitamin D Deficiency of "32" in 2010 and last vitamin D was still low at 44 in Mar 2016.   Medication Sig  . aspirin 81 MG tablet Take 81 mg by mouth daily.  Marland Kitchen. VITAMIN D PO Take 5,000 Units by mouth daily.  Marland Kitchen. FLAXSEED OiI Take by mouth. Takes 1 daily  . ATROVENT nasal spray Place 2 sprays into the nose 3 (three) times daily.  . Multiple Vitamins-Minerals  Take 1 tablet by mouth daily.   Allergies  Allergen Reactions  . Codeine    Past Medical History  Diagnosis Date  . Hearing aid worn   . Arthritis   . Cataract    Health Maintenance  Topic Date Due  . COLONOSCOPY  09/14/2000  . ZOSTAVAX  09/15/2010  . INFLUENZA VACCINE  12/12/2015  . TETANUS/TDAP  08/27/2018  . Hepatitis C Screening  Completed  . HIV Screening  Completed   Immunization History  Administered Date(s) Administered  . DT 05/13/1996  . Hepatitis  A 05/13/2009  . PPD Test 06/03/2013, 07/19/2014  . Tdap 08/26/2008   Past Surgical History  Procedure Laterality Date  . Eye surgery    . Pancreas surgery     Social History   Social History  . Marital Status: Married    Spouse Name: N/A  . Number of Children: N/A  . Years of Education: N/A   Occupational History  . Not on file.   Social History Main Topics  . Smoking status: Former Smoker    Quit date: 05/13/1998  . Smokeless tobacco: Not on file  . Alcohol Use: No  . Drug Use: Not on file  . 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.    ROS Constitutional: Denies fever, chills, weight loss/gain, headaches, insomnia,  night sweats or change in appetite. Does c/o fatigue. Eyes: Denies redness, blurred vision, diplopia, discharge, itchy or watery eyes.  ENT: Denies discharge, congestion, post nasal drip, epistaxis, sore throat, earache, hearing loss, dental pain, Tinnitus, Vertigo, Sinus pain or snoring.  Cardio: Denies chest pain, palpitations, irregular heartbeat, syncope, dyspnea, diaphoresis, orthopnea, PND, claudication or edema Respiratory: denies cough, dyspnea, DOE, pleurisy, hoarseness, laryngitis or wheezing.  Gastrointestinal: Denies dysphagia, heartburn, reflux, water brash, pain, cramps, nausea, vomiting, bloating, diarrhea, constipation, hematemesis, melena, hematochezia, jaundice or hemorrhoids Genitourinary: Denies dysuria, frequency, urgency, nocturia, hesitancy, discharge, hematuria or flank pain Musculoskeletal: Denies arthralgia, myalgia,  stiffness, Jt. Swelling, pain, limp or strain/sprain. Denies Falls. Skin: Denies puritis, rash, hives, warts, acne, eczema or change in skin lesion Neuro: No weakness, tremor, incoordination, spasms, paresthesia or pain Psychiatric: Denies confusion, memory loss or sensory loss. Denies Depression. Endocrine: Denies change in weight,  skin, hair change, nocturia, and paresthesia, diabetic polys, visual blurring or hyper / hypo glycemic episodes.  Heme/Lymph: No excessive bleeding, bruising or enlarged lymph nodes.  Physical Exam  BP 128/84 mmHg  Pulse 76  Temp(Src) 97.8 F (36.6 C)  Resp 16  Ht  (1.803 m)  Wt 206 lb 3.2 oz (93.532 kg)  BMI 28.77 kg/m2  General Appearance: Well nourished, in no apparent distress.  Eyes: PERRLA, EOMs, conjunctiva no swelling or erythema, normal fundi and vessels. Sinuses: No frontal/maxillary tenderness ENT/Mouth: EACs patent / TMs  nl. Nares clear without erythema, swelling, mucoid exudates. Oral hygiene is good. No erythema, swelling, or exudate. Tongue normal, non-obstructing. Tonsils not swollen or erythematous. Hearing normal.  Neck: Supple, thyroid normal. No bruits, nodes or JVD. Respiratory: Respiratory effort normal.  BS equal and clear bilateral without rales, rhonci, wheezing or stridor. Cardio: Heart sounds are normal with regular rate and rhythm and no murmurs, rubs or gallops. Peripheral pulses are normal and equal bilaterally without edema. No aortic or femoral bruits. Chest: symmetric with normal excursions and percussion.  Abdomen: Soft, with Nl bowel sounds. Nontender, no guarding, rebound, hernias, masses, or organomegaly.  Lymphatics: Non tender without lymphadenopathy.  Genitourinary: No hernias.Testes nl. DRE - prostate nl for age - smooth & firm w/o nodules. Musculoskeletal: Full ROM all peripheral extremities, joint stability, 5/5 strength, and normal gait. Skin: Warm and dry without rashes, lesions, cyanosis, clubbing or  ecchymosis.  Neuro: Cranial nerves intact, reflexes equal bilaterally. Normal muscle tone, no cerebellar symptoms. Sensation intact.  Pysch: Alert and oriented X 3 with normal affect, insight and judgment appropriate.   Assessment and Plan  1. Annual Preventative/Screening Exam   - Microalbumin / creatinine urine ratio - EKG  12-Lead - [place], RETROPERITNL ABD,  LTD - POC Hemoccult Bld/Stl  - Urinalysis, Routine w reflex microscopic  - Vitamin B12 - Iron and TIBC - PSA - Testosterone - CBC with Differential/Platelet - BASIC METABOLIC PANEL WITH GFR - Hepatic function panel - Magnesium - Lipid panel - TSH - Hemoglobin A1c - Insulin, random - VITAMIN D 25 Hydroxy   2. Essential hypertension  - Microalbumin / creatinine urine ratio - EKG 12-Lead - [place], RETROPERITNL ABD,  LTD - TSH  3. Mixed hyperlipidemia  - Lipid panel - TSH  4. Prediabetes  - Hemoglobin A1c - Insulin, random  5. Vitamin D deficiency  - VITAMIN D 25 Hydroxy  6. Screening for rectal cancer  - POC Hemoccult Bld/Stl   7. Prostate cancer screening   8. Other fatigue  - Vitamin B12 - Iron and TIBC - Testosterone - CBC with Differential/Platelet - TSH  9. Medication management  - Urinalysis, Routine w reflex microscopic - CBC with Differential/Platelet - BASIC METABOLIC PANEL WITH GFR - Hepatic function panel - Magnesium  10. Screening for AAA (abdominal aortic aneurysm)   11. Screening for ischemic heart disease    Continue prudent diet as discussed, weight control, BP monitoring, regular exercise, and medications as discussed.  Discussed med effects and SE's. Routine screening labs and tests as requested with regular follow-up as recommended. Over 40 minutes of exam, counseling, chart review and high complex critical decision making was performed

## 2015-08-15 NOTE — Patient Instructions (Signed)

## 2015-08-16 LAB — URINALYSIS, ROUTINE W REFLEX MICROSCOPIC
BILIRUBIN URINE: NEGATIVE
Glucose, UA: NEGATIVE
HGB URINE DIPSTICK: NEGATIVE
KETONES UR: NEGATIVE
Leukocytes, UA: NEGATIVE
NITRITE: NEGATIVE
PROTEIN: NEGATIVE
SPECIFIC GRAVITY, URINE: 1.019 (ref 1.001–1.035)
pH: 5.5 (ref 5.0–8.0)

## 2015-08-16 LAB — TESTOSTERONE: Testosterone: 190 ng/dL — ABNORMAL LOW (ref 250–827)

## 2015-08-16 LAB — MICROALBUMIN / CREATININE URINE RATIO
Creatinine, Urine: 101 mg/dL (ref 20–370)
Microalb Creat Ratio: 3 mcg/mg creat (ref <30)
Microalb, Ur: 0.3 mg/dL

## 2015-08-16 LAB — PSA: PSA: 0.61 ng/mL (ref <=4.00)

## 2015-08-16 LAB — VITAMIN D 25 HYDROXY (VIT D DEFICIENCY, FRACTURES): VIT D 25 HYDROXY: 37 ng/mL (ref 30–100)

## 2015-08-16 LAB — INSULIN, RANDOM: Insulin: 19.7 u[IU]/mL — ABNORMAL HIGH (ref 2.0–19.6)

## 2015-08-17 ENCOUNTER — Other Ambulatory Visit: Payer: Self-pay | Admitting: *Deleted

## 2015-08-17 MED ORDER — TESTOSTERONE CYPIONATE 200 MG/ML IM SOLN: INTRAMUSCULAR | Status: DC

## 2015-09-04 ENCOUNTER — Ambulatory Visit (INDEPENDENT_AMBULATORY_CARE_PROVIDER_SITE_OTHER): Payer: BC Managed Care – PPO | Admitting: *Deleted

## 2015-09-04 DIAGNOSIS — E291 Testicular hypofunction: Secondary | ICD-10-CM | POA: Diagnosis not present

## 2015-09-04 MED ORDER — TESTOSTERONE CYPIONATE 200 MG/ML IM SOLN
400.0000 mg | Freq: Once | INTRAMUSCULAR | Status: AC
  Administered 2015-09-04: 400 mg via INTRAMUSCULAR

## 2015-09-04 MED ORDER — TESTOSTERONE CYPIONATE 200 MG/ML IM SOLN
300.0000 mg | Freq: Once | INTRAMUSCULAR | Status: AC
  Administered 2015-09-04: 300 mg via INTRAMUSCULAR

## 2015-09-04 NOTE — Progress Notes (Signed)
Patient ID: Perry MarkerDaniel Evans, male   DOB: 12/23/1950, 65 y.o.   MRN: [id number] Patient presents for testosterone injection for hypogonadism Tx.  Patient received 1.5 cc IM right glute and tolerated well.  Will RTC in 3 weeks for next injection.

## 2015-09-15 ENCOUNTER — Ambulatory Visit: Payer: Self-pay

## 2015-09-25 ENCOUNTER — Ambulatory Visit (INDEPENDENT_AMBULATORY_CARE_PROVIDER_SITE_OTHER): Payer: BC Managed Care – PPO

## 2015-09-25 DIAGNOSIS — E291 Testicular hypofunction: Secondary | ICD-10-CM

## 2015-09-25 MED ORDER — TESTOSTERONE CYPIONATE 200 MG/ML IM SOLN
400.0000 mg | Freq: Once | INTRAMUSCULAR | Status: AC
  Administered 2015-09-25: 400 mg via INTRAMUSCULAR

## 2015-09-25 NOTE — Progress Notes (Signed)
Patient presents for testosterone injection for hypogonadism Tx. Patient received 1.5 cc IM lEFT glute and tolerated well. Will RTC in 3 weeks for next injection.

## 2015-10-10 ENCOUNTER — Encounter: Payer: Self-pay | Admitting: Physician Assistant

## 2015-10-10 ENCOUNTER — Ambulatory Visit (INDEPENDENT_AMBULATORY_CARE_PROVIDER_SITE_OTHER): Payer: BC Managed Care – PPO | Admitting: Physician Assistant

## 2015-10-10 VITALS — BP 130/68 | HR 90 | Temp 97.7°F | Resp 16 | Ht 71.0 in | Wt 208.2 lb

## 2015-10-10 DIAGNOSIS — R609 Edema, unspecified: Secondary | ICD-10-CM | POA: Diagnosis not present

## 2015-10-10 DIAGNOSIS — R0600 Dyspnea, unspecified: Secondary | ICD-10-CM | POA: Diagnosis not present

## 2015-10-10 DIAGNOSIS — R600 Localized edema: Secondary | ICD-10-CM

## 2015-10-10 DIAGNOSIS — M79671 Pain in right foot: Secondary | ICD-10-CM | POA: Diagnosis not present

## 2015-10-10 LAB — HEPATIC FUNCTION PANEL
ALBUMIN: 4.3 g/dL (ref 3.6–5.1)
ALK PHOS: 50 U/L (ref 40–115)
ALT: 25 U/L (ref 9–46)
AST: 21 U/L (ref 10–35)
BILIRUBIN INDIRECT: 0.6 mg/dL (ref 0.2–1.2)
BILIRUBIN TOTAL: 0.7 mg/dL (ref 0.2–1.2)
Bilirubin, Direct: 0.1 mg/dL (ref <=0.2)
Total Protein: 6.4 g/dL (ref 6.1–8.1)

## 2015-10-10 LAB — BASIC METABOLIC PANEL WITH GFR
BUN: 15 mg/dL (ref 7–25)
CO2: 28 mmol/L (ref 20–31)
Calcium: 9.9 mg/dL (ref 8.6–10.3)
Chloride: 103 mmol/L (ref 98–110)
Creat: 0.99 mg/dL (ref 0.70–1.25)
GFR, EST NON AFRICAN AMERICAN: 80 mL/min (ref >=60)
Glucose, Bld: 94 mg/dL (ref 65–99)
POTASSIUM: 4.3 mmol/L (ref 3.5–5.3)
Sodium: 138 mmol/L (ref 135–146)

## 2015-10-10 LAB — CBC WITH DIFFERENTIAL/PLATELET
BASOS PCT: 0 %
Basophils Absolute: 0 cells/uL (ref 0–200)
EOS PCT: 2 %
Eosinophils Absolute: 172 cells/uL (ref 15–500)
HCT: 47.8 % (ref 38.5–50.0)
Hemoglobin: 16.5 g/dL (ref 13.2–17.1)
LYMPHS PCT: 30 %
Lymphs Abs: 2580 cells/uL (ref 850–3900)
MCH: 31.2 pg (ref 27.0–33.0)
MCHC: 34.5 g/dL (ref 32.0–36.0)
MCV: 90.4 fL (ref 80.0–100.0)
MONOS PCT: 10 %
MPV: 10.6 fL (ref 7.5–12.5)
Monocytes Absolute: 860 cells/uL (ref 200–950)
NEUTROS ABS: 4988 {cells}/uL (ref 1500–7800)
Neutrophils Relative %: 58 %
PLATELETS: 225 10*3/uL (ref 140–400)
RBC: 5.29 MIL/uL (ref 4.20–5.80)
RDW: 13.8 % (ref 11.0–15.0)
WBC: 8.6 10*3/uL (ref 3.8–10.8)

## 2015-10-10 LAB — URIC ACID: URIC ACID, SERUM: 6.5 mg/dL (ref 4.0–7.8)

## 2015-10-10 MED ORDER — MELOXICAM 15 MG PO TABS: ORAL_TABLET | ORAL | Status: DC

## 2015-10-10 NOTE — Progress Notes (Signed)
Subjective:    Patient ID: Perry Evans, male    DOB: 10-22-1950, 65 y.o.   MRN: [id number]  HPI 65 y.o. WM with history of HTN, preDM, chol presents with bilateral swelling worse at night x 1 week. Dependent edema worse in the PM, better in the AM, some SOB with exertion that is new, no PND/orthopnea, weight is stable. Having pain at top of foot at night, feels like pins and needles, has history of OA, has been on vacation last 4 days. He has been taking aleve  2 in AM and occ one at 6. Has been treated for planar fasciitis in the past, states if he does not do the exercises still has severe pain. Has seen [place] [facility] in the past.    Blood pressure 130/68, pulse 90, temperature 97.7 F (36.5 C), temperature source Temporal, resp. rate 16, height  (1.803 m), weight 208 lb 3.2 oz (94.439 kg), SpO2 97 %.  Wt Readings from Last 3 Encounters:  10/10/15 208 lb 3.2 oz (94.439 kg)  08/15/15 206 lb 3.2 oz (93.532 kg)  05/09/15 210 lb (95.255 kg)   Patient Active Problem List   Diagnosis Date Noted  . Medication management 07/19/2014  . Essential hypertension 04/28/2013  . Mixed hyperlipidemia 04/28/2013  . Prediabetes 04/28/2013  . Vitamin D deficiency 04/28/2013   Current Outpatient Prescriptions on File Prior to Visit  Medication Sig Dispense Refill  . aspirin 81 MG tablet Take 81 mg by mouth daily.    . Cholecalciferol (VITAMIN D PO) Take 5,000 Units by mouth daily.    . fexofenadine (ALLEGRA) 180 MG tablet Take 180 mg by mouth daily.    . Flaxseed, Linseed, (FLAXSEED OIL PO) Take by mouth. Takes 1 daily    . ipratropium (ATROVENT) 0.03 % nasal spray Place 2 sprays into the nose 3 (three) times daily. 30 mL 2  . Multiple Vitamins-Minerals (MULTIVITAMIN ADULT PO) Take 1 tablet by mouth daily.    Marland Kitchen testosterone cypionate (DEPOTESTOSTERONE CYPIONATE) 200 MG/ML injection Inject 2 ml IM every 2 weeks. 10 mL 3   No current facility-administered medications on file prior to visit.    Review of Systems  Constitutional: Negative.   HENT: Negative.   Respiratory: Positive for shortness of breath and wheezing. Negative for apnea, cough, choking, chest tightness and stridor.   Cardiovascular: Positive for leg swelling. Negative for chest pain and palpitations.  Gastrointestinal: Negative.   Genitourinary: Negative.   Musculoskeletal: Positive for arthralgias (bilateral feet, right ankle worse than left).  Skin: Negative.  Negative for color change, pallor, rash and wound.  Neurological: Negative.        Objective:   Physical Exam  Constitutional: He is oriented to person, place, and time. He appears well-developed and well-nourished.  HENT:  Head: Normocephalic and atraumatic.  Right Ear: External ear normal.  Left Ear: External ear normal.  Mouth/Throat: Oropharynx is clear and moist.  Eyes: Conjunctivae and EOM are normal. Pupils are equal, round, and reactive to light.  Neck: Normal range of motion. Neck supple.  Cardiovascular: Normal rate, regular rhythm, normal heart sounds and normal pulses.   Pulses:      Dorsalis pedis pulses are 2+ on the right side, and 2+ on the left side.       Posterior tibial pulses are 2+ on the right side, and 2+ on the left side.  Loud S2  Pulmonary/Chest: Effort normal and breath sounds normal.  Abdominal: Soft. Bowel sounds are normal.  Musculoskeletal:  Normal range of motion. He exhibits edema (1+ edema).  Negative homen's, no palpable cord  Neurological: He is alert and oriented to person, place, and time. No cranial nerve deficit.  Skin: Skin is warm and dry.  Psychiatric: He has a normal mood and affect. His behavior is normal.        Assessment & Plan:  1. Bilateral edema Venous insuff versus CHF, versus OA/plantar fasciitis, low risk DVT no signs/symptoms Elevated legs, compression stockings, check labs, mobic x 2 weeks Follow up 2-4 weeks. - Uric acid - check labs - BNP- no CP, mild SOB- will check BNP if  elevated will go refer to cardiology- will go to ER if any SOB/CP

## 2015-10-10 NOTE — Patient Instructions (Signed)
HOME CARE INSTRUCTIONS   Do not stand or sit in one position for long periods of time. Do not sit with your legs crossed. Rest with your legs raised during the day.  Your legs have to be higher than your heart so that gravity will force the valves to open, so please really elevate your legs.   Wear elastic stockings or support hose. Do not wear other tight, encircling garments around the legs, pelvis, or waist.  [employer]  has a wide variety of well priced compression stockings. [address], [place] [id number] [phone]  Walk as much as possible to increase blood flow.  Raise the foot of your bed at night with 2-inch blocks. SEEK MEDICAL CARE IF:   The skin around your ankle starts to break down.  You have pain, redness, tenderness, or hard swelling developing in your leg over a vein.  You are uncomfortable due to leg pain. Document Released: 02/06/2005 Document Revised: 07/22/2011 Document Reviewed: 06/25/2010 [facility] Patient Information 2014 [employer] [place].  Venous Stasis or Chronic Venous Insufficiency Chronic venous insufficiency, also called venous stasis, is a condition that affects the veins in the legs. The condition prevents blood from being pumped through these veins effectively. Blood may no longer be pumped effectively from the legs back to the heart. This condition can range from mild to severe. With proper treatment, you should be able to continue with an active life. CAUSES  Chronic venous insufficiency occurs when the vein walls become stretched, weakened, or damaged or when valves within the vein are damaged. Some common causes of this include:  High blood pressure inside the veins (venous hypertension).  Increased blood pressure in the leg veins from long periods of sitting or standing.  A blood clot that blocks blood flow in a vein (deep vein thrombosis).  Inflammation of a superficial vein (phlebitis) that causes a blood clot to form. RISK  FACTORS Various things can make you more likely to develop chronic venous insufficiency, including:  Family history of this condition.  Obesity.  Pregnancy.  Sedentary lifestyle.  Smoking.  Jobs requiring long periods of standing or sitting in one place.  Being a certain age. Women in their 6840s and 8250s and men in their 6770s are more likely to develop this condition. SIGNS AND SYMPTOMS  Symptoms may include:   Varicose veins.  Skin breakdown or ulcers.  Reddened or discolored skin on the leg.  Brown, smooth, tight, and painful skin just above the ankle, usually on the inside surface (lipodermatosclerosis).  Swelling. DIAGNOSIS  To diagnose this condition, your health care provider will take a medical history and do a physical exam. The following tests may be ordered to confirm the diagnosis:  Duplex ultrasound--A procedure that produces a picture of a blood vessel and nearby organs and also provides information on blood flow through the blood vessel.  Plethysmography--A procedure that tests blood flow.  A venogram, or venography--A procedure used to look at the veins using X-ray and dye. TREATMENT The goals of treatment are to help you return to an active life and to minimize pain or disability. Treatment will depend on the severity of the condition. Medical procedures may be needed for severe cases. Treatment options may include:   Use of compression stockings. These can help with symptoms and lower the chances of the problem getting worse, but they do not cure the problem.  Sclerotherapy--A procedure involving an injection of a material that "dissolves" the damaged veins. Other  veins in the network of blood vessels take over the function of the damaged veins.  Surgery to remove the vein or cut off blood flow through the vein (vein stripping or laser ablation surgery).  Surgery to repair a valve. HOME CARE INSTRUCTIONS   Wear compression stockings as directed by your  health care provider.  Only take over-the-counter or prescription medicines for pain, discomfort, or fever as directed by your health care provider.  Follow up with your health care provider as directed. SEEK MEDICAL CARE IF:   You have redness, swelling, or increasing pain in the affected area.  You see a red streak or line that extends up or down from the affected area.  You have a breakdown or loss of skin in the affected area, even if the breakdown is small.  You have an injury to the affected area. SEEK IMMEDIATE MEDICAL CARE IF:   You have an injury and open wound in the affected area.  Your pain is severe and does not improve with medicine.  You have sudden numbness or weakness in the foot or ankle below the affected area, or you have trouble moving your foot or ankle.  You have a fever or persistent symptoms for more than 2-3 days.  You have a fever and your symptoms suddenly get worse. MAKE SURE YOU:   Understand these instructions.  Will watch your condition.  Will get help right away if you are not doing well or get worse.   This information is not intended to replace advice given to you by your health care provider. Make sure you discuss any questions you have with your health care provider.    Document Released: 09/02/2006 Document Revised: 02/17/2013 Document Reviewed: 01/04/2013 Elsevier Interactive Patient Education [employer]

## 2015-10-11 LAB — BRAIN NATRIURETIC PEPTIDE: Brain Natriuretic Peptide: 43.4 pg/mL (ref <100)

## 2015-10-16 ENCOUNTER — Ambulatory Visit (INDEPENDENT_AMBULATORY_CARE_PROVIDER_SITE_OTHER): Payer: BC Managed Care – PPO | Admitting: *Deleted

## 2015-10-16 DIAGNOSIS — E291 Testicular hypofunction: Secondary | ICD-10-CM

## 2015-10-16 MED ORDER — TESTOSTERONE CYPIONATE 200 MG/ML IM SOLN
300.0000 mg | Freq: Once | INTRAMUSCULAR | Status: AC
  Administered 2015-10-16: 300 mg via INTRAMUSCULAR

## 2015-10-16 NOTE — Progress Notes (Signed)
Patient ID: Perry MarkerDaniel Evans, male   DOB: 10/31/1950, 65 y.o.   MRN: [id number] Patient presents for testosterone injection for hypogonadism Tx. Patient received 2 cc IM right glute and tolerated well.  Patient will RTC in 3 weeks for next injection.

## 2015-10-23 ENCOUNTER — Encounter: Payer: Self-pay | Admitting: Physician Assistant

## 2015-10-23 ENCOUNTER — Ambulatory Visit (INDEPENDENT_AMBULATORY_CARE_PROVIDER_SITE_OTHER): Payer: BC Managed Care – PPO | Admitting: Physician Assistant

## 2015-10-23 VITALS — BP 126/70 | HR 94 | Temp 98.2°F | Resp 18 | Ht 71.0 in | Wt 208.0 lb

## 2015-10-23 DIAGNOSIS — R600 Localized edema: Secondary | ICD-10-CM

## 2015-10-23 DIAGNOSIS — R609 Edema, unspecified: Secondary | ICD-10-CM | POA: Diagnosis not present

## 2015-10-23 DIAGNOSIS — G609 Hereditary and idiopathic neuropathy, unspecified: Secondary | ICD-10-CM

## 2015-10-23 LAB — CBC WITH DIFFERENTIAL/PLATELET
BASOS PCT: 0 %
Basophils Absolute: 0 cells/uL (ref 0–200)
EOS ABS: 252 {cells}/uL (ref 15–500)
Eosinophils Relative: 3 %
HCT: 48 % (ref 38.5–50.0)
Hemoglobin: 16.7 g/dL (ref 13.2–17.1)
LYMPHS ABS: 2436 {cells}/uL (ref 850–3900)
Lymphocytes Relative: 29 %
MCH: 30.4 pg (ref 27.0–33.0)
MCHC: 34.8 g/dL (ref 32.0–36.0)
MCV: 87.4 fL (ref 80.0–100.0)
MONO ABS: 840 {cells}/uL (ref 200–950)
MPV: 10.5 fL (ref 7.5–12.5)
Monocytes Relative: 10 %
Neutro Abs: 4872 cells/uL (ref 1500–7800)
Neutrophils Relative %: 58 %
Platelets: 221 10*3/uL (ref 140–400)
RBC: 5.49 MIL/uL (ref 4.20–5.80)
RDW: 13.6 % (ref 11.0–15.0)
WBC: 8.4 10*3/uL (ref 3.8–10.8)

## 2015-10-23 MED ORDER — HYDROCHLOROTHIAZIDE 12.5 MG PO TABS: ORAL_TABLET | ORAL | Status: DC

## 2015-10-23 NOTE — Progress Notes (Signed)
Subjective:    Patient ID: Perry Evans, male    DOB: 03/19/1951, 65 y.o.   MRN: [id number]  HPI 65 y.o. WM with history of HTN, preDM, chol presents with dependent edema.  He has been elevating, wearing compression stockings and states that the swelling is much better.  He continue to have pain at top of bilateral feet at night, sharp pain that wakes him up nightly, pain will be 7. 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   Son is still stressing him out, has OD 9 times since the first of this year.   Blood pressure 126/70, pulse 94, temperature 98.2 F (36.8 C), temperature source Temporal, resp. rate 18, height  (1.803 m), weight 208 lb (94.348 kg).  Wt Readings from Last 3 Encounters:  10/23/15 208 lb (94.348 kg)  10/10/15 208 lb 3.2 oz (94.439 kg)  08/15/15 206 lb 3.2 oz (93.532 kg)   Patient Active Problem List   Diagnosis Date Noted  . Medication management 07/19/2014  . Essential hypertension 04/28/2013  . Mixed hyperlipidemia 04/28/2013  . Prediabetes 04/28/2013  . Vitamin D deficiency 04/28/2013   Current Outpatient Prescriptions on File Prior to Visit  Medication Sig Dispense Refill  . aspirin 81 MG tablet Take 81 mg by mouth daily.    . Cholecalciferol (VITAMIN D PO) Take 5,000 Units by mouth daily.    . fexofenadine (ALLEGRA) 180 MG tablet Take 180 mg by mouth daily.    . Flaxseed, Linseed, (FLAXSEED OIL PO) Take by mouth. Takes 1 daily    . ipratropium (ATROVENT) 0.03 % nasal spray Place 2 sprays into the nose 3 (three) times daily. 30 mL 2  . meloxicam (MOBIC) 15 MG tablet Take one daily with food for 2 weeks, can take with tylenol, can not take with aleve, iburpofen, then as needed daily for pain 30 tablet 1  . Multiple Vitamins-Minerals (MULTIVITAMIN ADULT PO) Take 1 tablet by mouth daily.    Marland Kitchen testosterone cypionate  (DEPOTESTOSTERONE CYPIONATE) 200 MG/ML injection Inject 2 ml IM every 2 weeks. 10 mL 3   No current facility-administered medications on file prior to visit.   Review of Systems  Constitutional: Negative.   HENT: Negative.   Respiratory: Negative for apnea, cough, choking, chest tightness, shortness of breath, wheezing and stridor.   Cardiovascular: Positive for leg swelling. Negative for chest pain and palpitations.  Gastrointestinal: Negative.   Genitourinary: Negative.   Musculoskeletal: Positive for arthralgias (bilateral feet, right ankle worse than left). Negative for back pain, joint swelling and gait problem.  Skin: Negative.  Negative for color change, pallor, rash and wound.  Neurological: Positive for numbness (bilateral hands and feet).       Objective:   Physical Exam  Constitutional: He is oriented to person, place, and time. He appears well-developed and well-nourished.  HENT:  Head: Normocephalic and atraumatic.  Right Ear: External ear normal.  Left Ear: External ear normal.  Mouth/Throat: Oropharynx is clear and moist.  Eyes: Conjunctivae and EOM are normal. Pupils are equal, round, and reactive to light.  Neck: Normal range of motion. Neck supple. No spinous process tenderness and no muscular tenderness present.  Cardiovascular: Normal rate, regular rhythm, normal heart sounds and normal pulses.   Pulses:      Dorsalis pedis pulses are 2+ on the right side, and  2+ on the left side.       Posterior tibial pulses are 2+ on the right side, and 2+ on the left side.  Loud S2  Pulmonary/Chest: Effort normal and breath sounds normal.  Abdominal: Soft. Bowel sounds are normal.  Musculoskeletal: Normal range of motion. He exhibits edema (1+ edema).  Right Phalen's: positive   Neurological: He is alert and oriented to person, place, and time. He has normal strength. He displays no atrophy and no tremor. No cranial nerve deficit.  Reflex Scores:      Patellar reflexes  are 1+ on the right side and 1+ on the left side.      Achilles reflexes are 1+ on the right side and 1+ on the left side. Skin: Skin is warm and dry.  Psychiatric: He has a normal mood and affect. His behavior is normal.        Assessment & Plan:  1. Bilateral edema Better with compression stockings, weight normal, negative BNP, no SOB/orthopnea, PND, will add on HCTZ low dose  2. Peripheral neuropathy hand and feet- no DM B12 normal 08/2015, normal HIV, RPR and hep C 05/2013 Start on lyrica samples Carpal tunnel brace at night Neurology referral, pt wants to wait, under a lot of stress with his son Will check labs, try samples

## 2015-10-23 NOTE — Patient Instructions (Signed)
Get carpal tunnel brace for the right hand and wear it at night for 8 weeks Can take fluid pill AS NEEDED for swelling in your legs once daily or can take daily  Can take the lyrica samples for nerve pain. It can make you sleepy so we suggest trying it at night first and please plan to not drive or do anything strenuous. Also please do not take this medication with alcohol.  Start out 1 pill at night before bed, can increase to 2 pills at night before bed. Please call the office if you have any side effects.   Can take 3 pills a day however you would like  Some examples: - 1 breakfast, lunch, bedtime. - 1 at breakfast, 2 at bed time  How should I use this medicine? Take this medicine by mouth with a glass of water. Follow the directions on the prescription label. You can take this medicine with or without food. Take your doses at regular intervals. Do not take your medicine more often than directed. Do not stop taking except on your doctor's advice.  What if I miss a dose? If you miss a dose, take it as soon as you can. If it is almost time for your next dose, take only that dose. Do not take double or extra doses.  What should I watch for while using this medicine? Tell your doctor or healthcare professional if your symptoms do not start to get better or if they get worse.   You may get drowsy or dizzy. Do not drive, use machinery, or do anything that needs mental alertness until you know how this medicine affects you. Do not stand or sit up quickly, especially if you are an older patient. This reduces the risk of dizzy or fainting spells. Alcohol may interfere with the effect of this medicine. Avoid alcoholic drinks. If you have a heart condition, like congestive heart failure, and notice that you are retaining water and have swelling in your hands or feet, contact your health care provider immediately.  What side effects may I notice from receiving this medicine? Side effects that you  should report to your doctor or health care professional as soon as possible and are very rare: -allergic reactions like skin rash, itching or hives, swelling of the face, lips, or tongue -breathing problems -changes in vision -jerking or unusual movements of any part of your body -suicidal thoughts or other mood changes -swelling of the ankles, feet, hands -unusual bruising or bleeding  Side effects that usually do not require medical attention (Report these to your doctor or health care professional if they continue or are bothersome.): -dizziness -drowsiness -dry mouth -nausea -tremors    .Peripheral Neuropathy Peripheral neuropathy is a type of nerve damage. It affects nerves that carry signals between the spinal cord and other parts of the body. These are called peripheral nerves. With peripheral neuropathy, one nerve or a group of nerves may be damaged.  CAUSES  Many things can damage peripheral nerves. For some people with peripheral neuropathy, the cause is unknown. Some causes include:  Diabetes. This is the most common cause of peripheral neuropathy.  Injury to a nerve.  Pressure or stress on a nerve that lasts a long time.  Too little vitamin B. Alcoholism can lead to this.  Infections.  Autoimmune diseases, such as multiple sclerosis and systemic lupus erythematosus.  Inherited nerve diseases.  Some medicines, such as cancer drugs.  Toxic substances, such as lead and mercury.  Too little blood flowing to the legs.  Kidney disease.  Thyroid disease. SIGNS AND SYMPTOMS  Different people have different symptoms. The symptoms you have will depend on which of your nerves is damaged. Common symptoms include:  Loss of feeling (numbness) in the feet and hands.  Tingling in the feet and hands.  Pain that burns.  Very sensitive skin.  Weakness.  Not being able to move a part of the body (paralysis).  Muscle twitching.  Clumsiness or poor  coordination.  Loss of balance.  Not being able to control your bladder.  Feeling dizzy.  Sexual problems. DIAGNOSIS  Peripheral neuropathy is a symptom, not a disease. Finding the cause of peripheral neuropathy can be hard. To figure that out, your health care provider will take a medical history and do a physical exam. A neurological exam will also be done. This involves checking things affected by your brain, spinal cord, and nerves (nervous system). For example, your health care provider will check your reflexes, how you move, and what you can feel.  Other types of tests may also be ordered, such as:  Blood tests.  A test of the fluid in your spinal cord.  Imaging tests, such as CT scans or an MRI.  Electromyography (EMG). This test checks the nerves that control muscles.  Nerve conduction velocity tests. These tests check how fast messages pass through your nerves.  Nerve biopsy. A small piece of nerve is removed. It is then checked under a microscope. TREATMENT   Medicine is often used to treat peripheral neuropathy. Medicines may include:  Pain-relieving medicines. Prescription or over-the-counter medicine may be suggested.  Antiseizure medicine. This may be used for pain.  Antidepressants. These also may help ease pain from neuropathy.  Lidocaine. This is a numbing medicine. You might wear a patch or be given a shot.  Mexiletine. This medicine is typically used to help control irregular heart rhythms.  Surgery. Surgery may be needed to relieve pressure on a nerve or to destroy a nerve that is causing pain.  Physical therapy to help movement.  Assistive devices to help movement. HOME CARE INSTRUCTIONS   Only take over-the-counter or prescription medicines as directed by your health care provider. Follow the instructions carefully for any given medicines. Do not take any other medicines without first getting approval from your health care provider.  If you have  diabetes, work closely with your health care provider to keep your blood sugar under control.  If you have numbness in your feet:  Check every day for signs of injury or infection. Watch for redness, warmth, and swelling.  Wear padded socks and comfortable shoes. These help protect your feet.  Do not do things that put pressure on your damaged nerve.  Do not smoke. Smoking keeps blood from getting to damaged nerves.  Avoid or limit alcohol. Too much alcohol can cause a lack of B vitamins. These vitamins are needed for healthy nerves.  Develop a good support system. Coping with peripheral neuropathy can be stressful. Talk to a mental health specialist or join a support group if you are struggling.  Follow up with your health care provider as directed. SEEK MEDICAL CARE IF:   You have new signs or symptoms of peripheral neuropathy.  You are struggling emotionally from dealing with peripheral neuropathy.  You have a fever. SEEK IMMEDIATE MEDICAL CARE IF:   You have an injury or infection that is not healing.  You feel very dizzy or begin vomiting.  You have chest pain.  You have trouble breathing.   This information is not intended to replace advice given to you by your health care provider. Make sure you discuss any questions you have with your health care provider.   Document Released: 04/19/2002 Document Revised: 01/09/2011 Document Reviewed: 01/04/2013 Elsevier Interactive Patient Education [employer]

## 2015-10-24 LAB — BASIC METABOLIC PANEL WITH GFR
BUN: 15 mg/dL (ref 7–25)
CHLORIDE: 106 mmol/L (ref 98–110)
CO2: 23 mmol/L (ref 20–31)
Calcium: 9.8 mg/dL (ref 8.6–10.3)
Creat: 1.05 mg/dL (ref 0.70–1.25)
GFR, Est African American: 86 mL/min (ref >=60)
GFR, Est Non African American: 74 mL/min (ref >=60)
GLUCOSE: 130 mg/dL — AB (ref 65–99)
POTASSIUM: 3.9 mmol/L (ref 3.5–5.3)
Sodium: 141 mmol/L (ref 135–146)

## 2015-10-24 LAB — HEPATIC FUNCTION PANEL
ALBUMIN: 4.3 g/dL (ref 3.6–5.1)
ALK PHOS: 55 U/L (ref 40–115)
ALT: 20 U/L (ref 9–46)
AST: 21 U/L (ref 10–35)
BILIRUBIN TOTAL: 0.7 mg/dL (ref 0.2–1.2)
Bilirubin, Direct: 0.1 mg/dL (ref <=0.2)
Indirect Bilirubin: 0.6 mg/dL (ref 0.2–1.2)
TOTAL PROTEIN: 6.3 g/dL (ref 6.1–8.1)

## 2015-10-24 LAB — ANA: ANA: NEGATIVE

## 2015-10-24 LAB — SEDIMENTATION RATE: Sed Rate: 1 mm/hr (ref 0–20)

## 2015-10-24 LAB — RHEUMATOID FACTOR

## 2015-10-25 LAB — LYME ABY, WSTRN BLT IGG & IGM W/BANDS
B BURGDORFERI IGM ABS (IB): NEGATIVE
B burgdorferi IgG Abs (IB): NEGATIVE
LYME DISEASE 30 KD IGG: NONREACTIVE
LYME DISEASE 39 KD IGM: NONREACTIVE
LYME DISEASE 41 KD IGM: NONREACTIVE
LYME DISEASE 58 KD IGG: NONREACTIVE
Lyme Disease 18 kD IgG: NONREACTIVE
Lyme Disease 23 kD IgG: NONREACTIVE
Lyme Disease 23 kD IgM: NONREACTIVE
Lyme Disease 28 kD IgG: NONREACTIVE
Lyme Disease 39 kD IgG: REACTIVE — AB
Lyme Disease 41 kD IgG: NONREACTIVE
Lyme Disease 45 kD IgG: NONREACTIVE
Lyme Disease 66 kD IgG: NONREACTIVE
Lyme Disease 93 kD IgG: NONREACTIVE

## 2015-10-26 LAB — METHYLMALONIC ACID, SERUM: METHYLMALONIC ACID, QUANT: 172 nmol/L (ref 87–318)

## 2015-11-07 ENCOUNTER — Ambulatory Visit: Payer: Self-pay

## 2015-11-10 ENCOUNTER — Other Ambulatory Visit: Payer: Self-pay | Admitting: Physician Assistant

## 2015-11-10 MED ORDER — GABAPENTIN 300 MG PO CAPS: ORAL_CAPSULE | ORAL | Status: DC

## 2015-11-24 ENCOUNTER — Ambulatory Visit (INDEPENDENT_AMBULATORY_CARE_PROVIDER_SITE_OTHER): Payer: BC Managed Care – PPO

## 2015-11-24 DIAGNOSIS — E291 Testicular hypofunction: Secondary | ICD-10-CM

## 2015-11-24 MED ORDER — TESTOSTERONE CYPIONATE 200 MG/ML IM SOLN
400.0000 mg | Freq: Once | INTRAMUSCULAR | Status: AC
  Administered 2015-11-24: 400 mg via INTRAMUSCULAR

## 2015-11-24 NOTE — Progress Notes (Signed)
Patient presents for testosterone injection for hypogonadism Tx. Patient received 2 cc IM left glute and tolerated well. Patient will RTC in 3 weeks for next injection.

## 2015-12-15 ENCOUNTER — Ambulatory Visit (INDEPENDENT_AMBULATORY_CARE_PROVIDER_SITE_OTHER): Payer: BC Managed Care – PPO | Admitting: *Deleted

## 2015-12-15 DIAGNOSIS — E291 Testicular hypofunction: Secondary | ICD-10-CM

## 2015-12-15 MED ORDER — TESTOSTERONE CYPIONATE 200 MG/ML IM SOLN
400.0000 mg | Freq: Once | INTRAMUSCULAR | Status: AC
  Administered 2015-12-15: 400 mg via INTRAMUSCULAR

## 2015-12-15 NOTE — Progress Notes (Signed)
Patient here for Testosterone Cypionate injection 200 mg/ml 2 ml IM given in the right upper outer quadrant. Patient tolerated well.

## 2016-01-04 ENCOUNTER — Ambulatory Visit (INDEPENDENT_AMBULATORY_CARE_PROVIDER_SITE_OTHER): Payer: BC Managed Care – PPO | Admitting: *Deleted

## 2016-01-04 DIAGNOSIS — E291 Testicular hypofunction: Secondary | ICD-10-CM

## 2016-01-04 MED ORDER — TESTOSTERONE CYPIONATE 200 MG/ML IM SOLN
400.0000 mg | Freq: Once | INTRAMUSCULAR | Status: AC
  Administered 2016-01-04: 400 mg via INTRAMUSCULAR

## 2016-01-04 NOTE — Progress Notes (Signed)
Patient is here for Testosterone Cypionate 200 mg /ml 2 ml IM injection in left upper outer quadrant.  Patient tolerated well.

## 2016-01-25 ENCOUNTER — Ambulatory Visit: Payer: Self-pay

## 2016-01-25 ENCOUNTER — Encounter: Payer: Self-pay | Admitting: Internal Medicine

## 2016-01-25 ENCOUNTER — Ambulatory Visit (INDEPENDENT_AMBULATORY_CARE_PROVIDER_SITE_OTHER): Payer: BC Managed Care – PPO | Admitting: Internal Medicine

## 2016-01-25 VITALS — BP 128/70 | HR 80 | Temp 98.2°F | Resp 16 | Ht 71.0 in | Wt 205.0 lb

## 2016-01-25 DIAGNOSIS — H6123 Impacted cerumen, bilateral: Secondary | ICD-10-CM

## 2016-01-25 DIAGNOSIS — J309 Allergic rhinitis, unspecified: Secondary | ICD-10-CM | POA: Diagnosis not present

## 2016-01-25 DIAGNOSIS — E291 Testicular hypofunction: Secondary | ICD-10-CM | POA: Diagnosis not present

## 2016-01-25 MED ORDER — FLUTICASONE PROPIONATE 50 MCG/ACT NA SUSP: 2.0000 | Freq: Every day | NASAL | 0 refills | Status: DC

## 2016-01-25 MED ORDER — PREDNISONE 20 MG PO TABS: ORAL_TABLET | ORAL | 0 refills | Status: DC

## 2016-01-25 MED ORDER — TESTOSTERONE CYPIONATE 200 MG/ML IM SOLN
400.0000 mg | Freq: Once | INTRAMUSCULAR | Status: AC
  Administered 2016-01-25: 400 mg via INTRAMUSCULAR

## 2016-01-25 NOTE — Progress Notes (Signed)
HPI  Patient presents to the office for evaluation of cough and runny nose.  It has been going on for 2 days.  Patient reports dry coughing.  They also endorse change in voice, postnasal drip and nasal congestion, mild sore throat, ear congestion.  .  They have tried none.  They report that nothing has worked.  They denies other sick contacts.  He does typically have seasonal allergies.  He has not been taking his allergy medications.  He reports that he was not taking any of the antiarthritis medications either.    Review of Systems  Constitutional: Positive for malaise/fatigue. Negative for chills and fever.  HENT: Positive for congestion, ear pain, hearing loss and sore throat.   Respiratory: Positive for cough. Negative for sputum production, shortness of breath and wheezing.   Cardiovascular: Negative for chest pain, palpitations and leg swelling.  Neurological: Positive for headaches.    PE:  Vitals:   01/25/16 1047  BP: 128/70  Pulse: 80  Resp: 16  Temp: 98.2 F (36.8 C)   General:  Alert and non-toxic, WDWN, NAD HEENT: NCAT, PERLA, EOM normal, no occular discharge or erythema.  Nasal mucosal edema with sinus tenderness to palpation.  Oropharynx clear with minimal oropharyngeal edema and erythema.  Mucous membranes moist and pink. Neck:  Cervical adenopathy Chest:  RRR no MRGs.  Lungs clear to auscultation A&P with no wheezes rhonchi or rales.   Abdomen: +BS x 4 quadrants, soft, non-tender, no guarding, rigidity, or rebound. Skin: warm and dry no rash Neuro: A&Ox4, CN II-XII grossly intact  Assessment and Plan:   1. Allergic rhinitis, unspecified allergic rhinitis type -prednisone -allegra -flonase  2. Hypogonadism in male  - testosterone cypionate (DEPOTESTOSTERONE CYPIONATE) injection 400 mg; Inject 2 mLs (400 mg total) into the muscle once.  3. Cerumen impaction, bilateral  - Ear Lavage

## 2016-02-06 ENCOUNTER — Ambulatory Visit (INDEPENDENT_AMBULATORY_CARE_PROVIDER_SITE_OTHER): Payer: BC Managed Care – PPO | Admitting: Physician Assistant

## 2016-02-06 ENCOUNTER — Encounter: Payer: Self-pay | Admitting: Physician Assistant

## 2016-02-06 VITALS — BP 130/80 | HR 83 | Temp 97.7°F | Resp 14 | Ht 71.0 in | Wt 206.2 lb

## 2016-02-06 DIAGNOSIS — R600 Localized edema: Secondary | ICD-10-CM

## 2016-02-06 DIAGNOSIS — R609 Edema, unspecified: Secondary | ICD-10-CM | POA: Diagnosis not present

## 2016-02-06 MED ORDER — HYDROCHLOROTHIAZIDE 12.5 MG PO TABS: 12.5000 mg | ORAL_TABLET | Freq: Every day | ORAL | 3 refills | Status: DC

## 2016-02-06 NOTE — Progress Notes (Signed)
Subjective:    Patient ID: Perry Evans, male    DOB: 02/20/1951, 65 y.o.   MRN: [id number]  HPI 65 y.o. WM with history of HTN, preDM, chol presents with bilateral swelling worse at night x June.  Patient went fishing Friday with his son, states they were standing for long periods of time surf fishing. States his legs were more swollen when he came back. No SOB was able to run up sand bank with no SOB, No PND/orthopnea. Had normal BNP, weight unchanged/dcrease by 2 lbs.   Had pain on the top of his foot, took lyrica and states that he felt better after taking that. Carpal tunnel brace helps with numbness in hands.   Blood pressure 130/80, pulse 83, temperature 97.7 F (36.5 C), resp. rate 14, height 5\' 11"  (1.803 m), weight 206 lb 3.2 oz (93.5 kg), SpO2 97 %.  Wt Readings from Last 3 Encounters:  02/06/16 206 lb 3.2 oz (93.5 kg)  01/25/16 205 lb (93 kg)  10/23/15 208 lb (94.3 kg)   Patient Active Problem List   Diagnosis Date Noted  . Medication management 07/19/2014  . Essential hypertension 04/28/2013  . Mixed hyperlipidemia 04/28/2013  . Prediabetes 04/28/2013  . Vitamin D deficiency 04/28/2013   Current Outpatient Prescriptions on File Prior to Visit  Medication Sig Dispense Refill  . aspirin 81 MG tablet Take 81 mg by mouth daily.    . Cholecalciferol (VITAMIN D PO) Take 5,000 Units by mouth daily.    . Flaxseed, Linseed, (FLAXSEED OIL PO) Take by mouth. Takes 1 daily    . fluticasone (FLONASE) 50 MCG/ACT nasal spray Place 2 sprays into both nostrils daily. 16 g 0  . ipratropium (ATROVENT) 0.03 % nasal spray Place 2 sprays into the nose 3 (three) times daily. 30 mL 2  . Multiple Vitamins-Minerals (MULTIVITAMIN ADULT PO) Take 1 tablet by mouth daily.    Marland Kitchen. testosterone cypionate (DEPOTESTOSTERONE CYPIONATE) 200 MG/ML injection Inject 2 ml IM every 2 weeks. 10 mL 3   No current facility-administered medications on file prior to visit.    Review of Systems  Constitutional:  Negative.   HENT: Negative.   Respiratory: Positive for shortness of breath and wheezing. Negative for apnea, cough, choking, chest tightness and stridor.   Cardiovascular: Positive for leg swelling. Negative for chest pain and palpitations.  Gastrointestinal: Negative.   Genitourinary: Negative.   Musculoskeletal: Positive for arthralgias (bilateral feet, right ankle worse than left).  Skin: Negative.  Negative for color change, pallor, rash and wound.  Neurological: Negative.        Objective:   Physical Exam  Constitutional: He is oriented to person, place, and time. He appears well-developed and well-nourished.  HENT:  Head: Normocephalic and atraumatic.  Right Ear: External ear normal.  Left Ear: External ear normal.  Mouth/Throat: Oropharynx is clear and moist.  Eyes: Conjunctivae and EOM are normal. Pupils are equal, round, and reactive to light.  Neck: Normal range of motion. Neck supple.  Cardiovascular: Normal rate, regular rhythm, normal heart sounds and normal pulses.   Pulses:      Dorsalis pedis pulses are 2+ on the right side, and 2+ on the left side.       Posterior tibial pulses are 2+ on the right side, and 2+ on the left side.  Loud S2  Pulmonary/Chest: Effort normal and breath sounds normal.  Abdominal: Soft. Bowel sounds are normal.  Musculoskeletal: Normal range of motion. He exhibits edema (1+ edema).  Negative  homen's, no palpable cord  Neurological: He is alert and oriented to person, place, and time. No cranial nerve deficit.  Skin: Skin is warm and dry.  Psychiatric: He has a normal mood and affect. His behavior is normal.        Assessment & Plan:  1. Bilateral edema after standing Patient reassured, no weight gain, no SOB/PND, orthopnea. Negative BNP. HCTZ PRN, compression stockings, no need for labs at this time The patient was advised to call immediately if he has any concerning symptoms in the interval. The patient voices understanding of  current treatment options and is in agreement with the current care plan.   Future Appointments Date Time Provider Department Center  02/15/2016 4:30 PM [facility] NURSE [facility] None  09/16/2016 2:00 PM Lucky Cowboy, MD [facility] None

## 2016-02-06 NOTE — Patient Instructions (Signed)
Can take HCTZ as needed for swelling Wear compression stockings Elevate feet  Edema Edema is an abnormal buildup of fluids in your bodytissues. Edema is somewhatdependent on gravity to pull the fluid to the lowest place in your body. That makes the condition more common in the legs and thighs (lower extremities). Painless swelling of the feet and ankles is common and becomes more likely as you get older. It is also common in looser tissues, like around your eyes.  When the affected area is squeezed, the fluid may move out of that spot and leave a dent for a few moments. This dent is called pitting.  CAUSES  There are many possible causes of edema. Eating too much salt and being on your feet or sitting for a long time can cause edema in your legs and ankles. Hot weather may make edema worse. Common medical causes of edema include:  Heart failure.  Liver disease.  Kidney disease.  Weak blood vessels in your legs.  Cancer.  An injury.  Pregnancy.  Some medications.  Obesity. SYMPTOMS  Edema is usually painless.Your skin may look swollen or shiny.  DIAGNOSIS  Your health care provider may be able to diagnose edema by asking about your medical history and doing a physical exam. You may need to have tests such as X-rays, an electrocardiogram, or blood tests to check for medical conditions that may cause edema.  TREATMENT  Edema treatment depends on the cause. If you have heart, liver, or kidney disease, you need the treatment appropriate for these conditions. General treatment may include:  Elevation of the affected body part above the level of your heart.  Compression of the affected body part. Pressure from elastic bandages or support stockings squeezes the tissues and forces fluid back into the blood vessels. This keeps fluid from entering the tissues.  Restriction of fluid and salt intake.  Use of a water pill (diuretic). These medications are appropriate only for some types  of edema. They pull fluid out of your body and make you urinate more often. This gets rid of fluid and reduces swelling, but diuretics can have side effects. Only use diuretics as directed by your health care provider. HOME CARE INSTRUCTIONS   Keep the affected body part above the level of your heart when you are lying down.   Do not sit still or stand for prolonged periods.   Do not put anything directly under your knees when lying down.  Do not wear constricting clothing or garters on your upper legs.   Exercise your legs to work the fluid back into your blood vessels. This may help the swelling go down.   Wear elastic bandages or support stockings to reduce ankle swelling as directed by your health care provider.   Eat a low-salt diet to reduce fluid if your health care provider recommends it.   Only take medicines as directed by your health care provider. SEEK MEDICAL CARE IF:   Your edema is not responding to treatment.  You have heart, liver, or kidney disease and notice symptoms of edema.  You have edema in your legs that does not improve after elevating them.   You have sudden and unexplained weight gain. SEEK IMMEDIATE MEDICAL CARE IF:   You develop shortness of breath or chest pain.   You cannot breathe when you lie down.  You develop pain, redness, or warmth in the swollen areas.   You have heart, liver, or kidney disease and suddenly get edema.  You have a fever and your symptoms suddenly get worse. MAKE SURE YOU:   Understand these instructions.  Will watch your condition.  Will get help right away if you are not doing well or get worse.   This information is not intended to replace advice given to you by your health care provider. Make sure you discuss any questions you have with your health care provider.   Document Released: 04/29/2005 Document Revised: 05/20/2014 Document Reviewed: 02/19/2013 Elsevier Interactive Patient Education 2016  [employer]   Peripheral Edema You have swelling in your legs (peripheral edema). This swelling is due to excess accumulation of salt and water in your body. Edema may be a sign of heart, kidney or liver disease, or a side effect of a medication. It may also be due to problems in the leg veins. Elevating your legs and using special support stockings may be very helpful, if the cause of the swelling is due to poor venous circulation. Avoid long periods of standing, whatever the cause. Treatment of edema depends on identifying the cause. Chips, pretzels, pickles and other salty foods should be avoided. Restricting salt in your diet is almost always needed. Water pills (diuretics) are often used to remove the excess salt and water from your body via urine. These medicines prevent the kidney from reabsorbing sodium. This increases urine flow. Diuretic treatment may also result in lowering of potassium levels in your body. Potassium supplements may be needed if you have to use diuretics daily. Daily weights can help you keep track of your progress in clearing your edema. You should call your caregiver for follow up care as recommended. SEEK IMMEDIATE MEDICAL CARE IF:   You have increased swelling, pain, redness, or heat in your legs.  You develop shortness of breath, especially when lying down.  You develop chest or abdominal pain, weakness, or fainting.  You have a fever.   This information is not intended to replace advice given to you by your health care provider. Make sure you discuss any questions you have with your health care provider.   Document Released: 06/06/2004 Document Revised: 07/22/2011 Document Reviewed: 11/09/2014 Elsevier Interactive Patient Education [employer]

## 2016-02-15 ENCOUNTER — Ambulatory Visit: Payer: Self-pay

## 2016-02-20 ENCOUNTER — Ambulatory Visit (INDEPENDENT_AMBULATORY_CARE_PROVIDER_SITE_OTHER): Payer: BC Managed Care – PPO | Admitting: *Deleted

## 2016-02-20 DIAGNOSIS — E291 Testicular hypofunction: Secondary | ICD-10-CM

## 2016-02-20 MED ORDER — TESTOSTERONE CYPIONATE 200 MG/ML IM SOLN
400.0000 mg | Freq: Once | INTRAMUSCULAR | Status: AC
  Administered 2016-02-20: 400 mg via INTRAMUSCULAR

## 2016-02-20 NOTE — Progress Notes (Signed)
Patient presents for testosterone injection for hypogonadism Tx.  Patient received 2 cc IM right glut and tolerated well.   

## 2016-03-12 ENCOUNTER — Ambulatory Visit: Payer: Self-pay

## 2016-03-12 ENCOUNTER — Ambulatory Visit (INDEPENDENT_AMBULATORY_CARE_PROVIDER_SITE_OTHER): Payer: BC Managed Care – PPO | Admitting: *Deleted

## 2016-03-12 DIAGNOSIS — E291 Testicular hypofunction: Secondary | ICD-10-CM

## 2016-03-12 MED ORDER — TESTOSTERONE CYPIONATE 200 MG/ML IM SOLN
400.0000 mg | Freq: Once | INTRAMUSCULAR | Status: AC
  Administered 2016-03-12: 400 mg via INTRAMUSCULAR

## 2016-03-12 NOTE — Progress Notes (Signed)
Patient here for a NV for administration of Testosterone Cypionate 200 mg/ml 2 ml IM in left upper outer quadrant. Patient tolerated the injection well.

## 2016-04-02 ENCOUNTER — Ambulatory Visit: Payer: Self-pay

## 2016-04-11 ENCOUNTER — Ambulatory Visit (INDEPENDENT_AMBULATORY_CARE_PROVIDER_SITE_OTHER): Payer: BC Managed Care – PPO

## 2016-04-11 DIAGNOSIS — E291 Testicular hypofunction: Secondary | ICD-10-CM | POA: Diagnosis not present

## 2016-04-11 MED ORDER — TESTOSTERONE CYPIONATE 200 MG/ML IM SOLN
200.0000 mg | INTRAMUSCULAR | Status: DC
  Administered 2016-04-11: 200 mg via INTRAMUSCULAR

## 2016-04-11 NOTE — Progress Notes (Signed)
Patient here for a NV for administration of Testosterone Cypionate 200 mg/ml 2 ml IM in right upper outer quadrant. Patient tolerated the injection well.

## 2016-04-17 ENCOUNTER — Other Ambulatory Visit: Payer: Self-pay | Admitting: Internal Medicine

## 2016-04-17 NOTE — Telephone Encounter (Signed)
please call Depo-Testosterone

## 2016-04-30 ENCOUNTER — Other Ambulatory Visit: Payer: Self-pay | Admitting: *Deleted

## 2016-04-30 MED ORDER — TESTOSTERONE CYPIONATE 200 MG/ML IM SOLN: INTRAMUSCULAR | 3 refills | Status: DC

## 2016-05-02 ENCOUNTER — Ambulatory Visit: Payer: Self-pay

## 2016-05-08 ENCOUNTER — Ambulatory Visit (INDEPENDENT_AMBULATORY_CARE_PROVIDER_SITE_OTHER): Payer: BC Managed Care – PPO | Admitting: *Deleted

## 2016-05-08 DIAGNOSIS — E291 Testicular hypofunction: Secondary | ICD-10-CM

## 2016-05-08 MED ORDER — TESTOSTERONE CYPIONATE 200 MG/ML IM SOLN
400.0000 mg | Freq: Once | INTRAMUSCULAR | Status: AC
  Administered 2016-05-08: 400 mg via INTRAMUSCULAR

## 2016-05-08 NOTE — Progress Notes (Signed)
Patient here for a NV to administer Testosterone Cypionate 200 mg 2 ml IM in left upper outer quadrant. Tolerated well.

## 2016-05-22 ENCOUNTER — Ambulatory Visit: Payer: Self-pay

## 2016-05-22 ENCOUNTER — Other Ambulatory Visit: Payer: Self-pay | Admitting: Occupational Medicine

## 2016-05-22 DIAGNOSIS — M79671 Pain in right foot: Secondary | ICD-10-CM

## 2016-05-23 ENCOUNTER — Encounter: Payer: Self-pay | Admitting: Internal Medicine

## 2016-05-23 ENCOUNTER — Ambulatory Visit (INDEPENDENT_AMBULATORY_CARE_PROVIDER_SITE_OTHER): Payer: BC Managed Care – PPO | Admitting: Internal Medicine

## 2016-05-23 VITALS — BP 126/66 | HR 84 | Temp 98.2°F | Resp 16 | Ht 71.0 in | Wt 206.0 lb

## 2016-05-23 DIAGNOSIS — H1032 Unspecified acute conjunctivitis, left eye: Secondary | ICD-10-CM | POA: Diagnosis not present

## 2016-05-23 DIAGNOSIS — J069 Acute upper respiratory infection, unspecified: Secondary | ICD-10-CM

## 2016-05-23 MED ORDER — AZITHROMYCIN 250 MG PO TABS: ORAL_TABLET | ORAL | 0 refills | Status: DC

## 2016-05-23 MED ORDER — ERYTHROMYCIN 5 MG/GM OP OINT: TOPICAL_OINTMENT | Freq: Three times a day (TID) | OPHTHALMIC | 0 refills | Status: AC

## 2016-05-23 MED ORDER — PROMETHAZINE-DM 6.25-15 MG/5ML PO SYRP: ORAL_SOLUTION | ORAL | 1 refills | Status: DC

## 2016-05-23 MED ORDER — FLUTICASONE PROPIONATE 50 MCG/ACT NA SUSP: 2.0000 | Freq: Every day | NASAL | 0 refills | Status: DC

## 2016-05-23 MED ORDER — PREDNISONE 20 MG PO TABS: ORAL_TABLET | ORAL | 0 refills | Status: DC

## 2016-05-23 NOTE — Progress Notes (Signed)
HPI  Patient presents to the office for evaluation of cough.  It has been going on for 1 weeks.  Patient reports night > day, wet, worse with lying down.  They also endorse change in voice, postnasal drip and nasal congestion, ear congestion, and headache.  Some sinus pressure.  .  They have tried sudefed.  They report that nothing has worked.  They admits to other sick contacts.  Review of Systems  Constitutional: Positive for malaise/fatigue. Negative for chills and fever.  HENT: Positive for congestion, ear pain, hearing loss and sore throat.   Respiratory: Positive for cough. Negative for sputum production, shortness of breath and wheezing.   Cardiovascular: Negative for chest pain, palpitations and leg swelling.  Neurological: Positive for headaches.    PE:  Vitals:   05/23/16 1407  BP: 126/66  Pulse: 84  Resp: 16  Temp: 98.2 F (36.8 C)    General:  Alert and non-toxic, WDWN, NAD HEENT: NCAT, PERLA, EOM normal, no occular discharge/  Left eye with conjunctival injection and redness.  Nasal mucosal edema with sinus tenderness to palpation.  Oropharynx clear with minimal oropharyngeal edema and erythema.  Mucous membranes moist and pink. Neck:  Cervical adenopathy Chest:  RRR no MRGs.  Lungs clear to auscultation A&P with no wheezes rhonchi or rales.   Abdomen: +BS x 4 quadrants, soft, non-tender, no guarding, rigidity, or rebound. Skin: warm and dry no rash Neuro: A&Ox4, CN II-XII grossly intact  Assessment and Plan:    1. Acute URI -prednisone -zpak -flonase -nasal saline -mucinex -can take sudafed if desired -phenergan dm -romycin for eye

## 2016-05-29 ENCOUNTER — Ambulatory Visit: Payer: Self-pay

## 2016-06-06 ENCOUNTER — Ambulatory Visit (INDEPENDENT_AMBULATORY_CARE_PROVIDER_SITE_OTHER): Payer: BC Managed Care – PPO

## 2016-06-06 DIAGNOSIS — E291 Testicular hypofunction: Secondary | ICD-10-CM | POA: Diagnosis not present

## 2016-06-06 MED ORDER — TESTOSTERONE CYPIONATE 200 MG/ML IM SOLN
200.0000 mg | INTRAMUSCULAR | Status: DC
  Administered 2016-06-06 – 2016-08-02 (×3): 200 mg via INTRAMUSCULAR

## 2016-06-06 NOTE — Progress Notes (Signed)
Patient here for a NV for administration of Testosterone Cypionate 200 mg/ml 2 ml IM in LEFT upper outer quadrant. Patient tolerated the injection well.

## 2016-06-24 ENCOUNTER — Ambulatory Visit: Payer: Self-pay

## 2016-06-25 ENCOUNTER — Ambulatory Visit: Payer: Self-pay

## 2016-06-26 ENCOUNTER — Ambulatory Visit (INDEPENDENT_AMBULATORY_CARE_PROVIDER_SITE_OTHER): Payer: BC Managed Care – PPO | Admitting: *Deleted

## 2016-06-26 DIAGNOSIS — E291 Testicular hypofunction: Secondary | ICD-10-CM

## 2016-06-26 NOTE — Progress Notes (Signed)
Patient presents for testosterone injection for hypogonadism Tx. Patient received 2 cc IM right glute and tolerated well 

## 2016-07-17 ENCOUNTER — Ambulatory Visit: Payer: Self-pay

## 2016-08-02 ENCOUNTER — Ambulatory Visit (INDEPENDENT_AMBULATORY_CARE_PROVIDER_SITE_OTHER): Payer: BC Managed Care – PPO | Admitting: Internal Medicine

## 2016-08-02 ENCOUNTER — Encounter: Payer: Self-pay | Admitting: Internal Medicine

## 2016-08-02 VITALS — BP 136/84 | HR 70 | Temp 98.0°F | Resp 16 | Ht 71.0 in | Wt 198.0 lb

## 2016-08-02 DIAGNOSIS — M545 Low back pain, unspecified: Secondary | ICD-10-CM

## 2016-08-02 DIAGNOSIS — E291 Testicular hypofunction: Secondary | ICD-10-CM | POA: Diagnosis not present

## 2016-08-02 MED ORDER — DEXAMETHASONE SODIUM PHOSPHATE 10 MG/ML IJ SOLN
10.0000 mg | Freq: Once | INTRAMUSCULAR | Status: AC
  Administered 2016-08-02: 10 mg via INTRAMUSCULAR

## 2016-08-02 MED ORDER — CYCLOBENZAPRINE HCL 10 MG PO TABS: 10.0000 mg | ORAL_TABLET | Freq: Three times a day (TID) | ORAL | 0 refills | Status: DC | PRN

## 2016-08-02 MED ORDER — MELOXICAM 15 MG PO TABS: 15.0000 mg | ORAL_TABLET | Freq: Every day | ORAL | 2 refills | Status: DC

## 2016-08-02 MED ORDER — TESTOSTERONE CYPIONATE 200 MG/ML IM SOLN: INTRAMUSCULAR | 3 refills | Status: DC

## 2016-08-02 NOTE — Progress Notes (Signed)
Assessment and Plan:   1. Acute left-sided low back pain without sciatica -no evidence of cauda equina or red flags -likely lumbar strain vs. Muscle spasms -trigger point injection given here in the office with some immediate relief -will take meloxicam once daily with food and plenty of water until this improves -muscle relaxers as needed -cont heat as often as tolerated -avoid twisting and forward bending motions -no lifting greater than 25 lbs - meloxicam (MOBIC) 15 MG tablet; Take 1 tablet (15 mg total) by mouth daily.  Dispense: 30 tablet; Refill: 2 - cyclobenzaprine (FLEXERIL) 10 MG tablet; Take 1 tablet (10 mg total) by mouth 3 (three) times daily as needed for muscle spasms.  Dispense: 30 tablet; Refill: 0 - dexamethasone (DECADRON) injection 10 mg; Inject 1 mL (10 mg total) into the muscle once.     HPI 66 y.o.male presents for left sided low back pain on the left lower back.  He reports that this has been going on approximately for a week.  He reports that it was getting better and then he jumped over a low fence and this aggravated it again. He reports that he is not getting any radiation of the left leg.  He reports that twisting, bending forward, and walking is the worst.  He reports that the more that he moves it feels a little bit better.  He reports that he has been using heat and this helps.  He has not been taking advil 400 mg but not regularly.  He reports that he generally does not have issues with this back.  He does try to use good techniques with lifting.    Past Medical History:  Diagnosis Date  . Arthritis   . Cataract   . Hearing aid worn      Allergies  Allergen Reactions  . Codeine       Current Outpatient Prescriptions on File Prior to Visit  Medication Sig Dispense Refill  . aspirin 81 MG tablet Take 81 mg by mouth daily.    . Cholecalciferol (VITAMIN D PO) Take 5,000 Units by mouth daily.    . Flaxseed, Linseed, (FLAXSEED OIL PO) Take by mouth.  Takes 1 daily    . fluticasone (FLONASE) 50 MCG/ACT nasal spray Place 2 sprays into both nostrils daily. 16 g 0  . Multiple Vitamins-Minerals (MULTIVITAMIN ADULT PO) Take 1 tablet by mouth daily.    Marland Kitchen testosterone cypionate (DEPOTESTOSTERONE CYPIONATE) 200 MG/ML injection INJECT 2 MLS INTRAMUSCULARLY EVERY 2 WEEKS 10 mL 3   No current facility-administered medications on file prior to visit.     Review of Systems  Constitutional: Negative for chills, fever and malaise/fatigue.  Respiratory: Negative for cough, shortness of breath and wheezing.   Cardiovascular: Negative for chest pain, palpitations and leg swelling.  Genitourinary: Negative for dysuria, flank pain, frequency, hematuria and urgency.  Musculoskeletal: Positive for back pain.  Neurological: Negative for sensory change, focal weakness and loss of consciousness.     Physical Exam: Filed Weights   08/02/16 1057  Weight: 198 lb (89.8 kg)   BP 136/84   Pulse 70   Temp 98 F (36.7 C) (Temporal)   Resp 16   Ht 5\' 11"  (1.803 m)   Wt 198 lb (89.8 kg)   BMI 27.62 kg/m  General Appearance: Well developed well nourished, non-toxic appearing in no apparent distress. Eyes: PERRLA, EOMs, conjunctiva w/ no swelling or erythema or discharge Sinuses: No Frontal/maxillary tenderness ENT/Mouth: Ear canals clear without swelling or erythema.  TM's normal bilaterally with no retractions, bulging, or loss of landmarks.   Neck: Supple, thyroid normal, no notable JVD  Respiratory: Respiratory effort normal, Clear breath sounds anteriorly and posteriorly bilaterally without rales, rhonchi, wheezing or stridor. No retractions or accessory muscle usage. Cardio: RRR with no MRGs.   Abdomen: Soft, + BS.  Non tender, no guarding, rebound, hernias, masses.  Musculoskeletal: Patient rises slowly from sitting to standing.  They walk without an antalgic gait.  There is no evidence of erythema, ecchymosis, or gross deformity.  There is tenderness  to palpation over left SI joint and left lumbar paraspinal muscles.  No bony spine tenderness to palpation.  Active ROM is limited due to pain.  Sensation to light touch is intact over all extremities.  Strength is symmetric and equal in all extremities.     Full ROM, 5/5 strength, normal gait.  Skin: Warm, dry without rashes  Neuro: Awake and oriented X 3, Cranial nerves intact. Normal muscle tone, no cerebellar symptoms. Sensation intact.  Psych: normal affect, Insight and Judgment appropriate.     Terri Piedraourtney Forcucci, PA-C 11:14 AM [facility]

## 2016-08-02 NOTE — Patient Instructions (Signed)
Low Back Strain Rehab  Ask your health care provider which exercises are safe for you. Do exercises exactly as told by your health care provider and adjust them as directed. It is normal to feel mild stretching, pulling, tightness, or discomfort as you do these exercises, but you should stop right away if you feel sudden pain or your pain gets worse. Do not begin these exercises until told by your health care provider.  Stretching and range of motion exercises  These exercises warm up your muscles and joints and improve the movement and flexibility of your back. These exercises also help to relieve pain, numbness, and tingling.  Exercise A: Single knee to chest     1. Lie on your back on a firm surface with both legs straight.  2. Bend one of your knees. Use your hands to move your knee up toward your chest until you feel a gentle stretch in your lower back and buttock.  ? Hold your leg in this position by holding onto the front of your knee.  ? Keep your other leg as straight as possible.  3. Hold for __________ seconds.  4. Slowly return to the starting position.  5. Repeat with your other leg.  Repeat __________ times. Complete this exercise __________ times a day.  Exercise B: Prone extension on elbows     1. Lie on your abdomen on a firm surface.  2. Prop yourself up on your elbows.  3. Use your arms to help lift your chest up until you feel a gentle stretch in your abdomen and your lower back.  ? This will place some of your body weight on your elbows. If this is uncomfortable, try stacking pillows under your chest.  ? Your hips should stay down, against the surface that you are lying on. Keep your hip and back muscles relaxed.  4. Hold for __________ seconds.  5. Slowly relax your upper body and return to the starting position.  Repeat __________ times. Complete this exercise __________ times a day.  Strengthening exercises  These exercises build strength and endurance in your back. Endurance is the ability  to use your muscles for a long time, even after they get tired.  Exercise C: Pelvic tilt   1. Lie on your back on a firm surface. Bend your knees and keep your feet flat.  2. Tense your abdominal muscles. Tip your pelvis up toward the ceiling and flatten your lower back into the floor.  ? To help with this exercise, you may place a small towel under your lower back and try to push your back into the towel.  3. Hold for __________ seconds.  4. Let your muscles relax completely before you repeat this exercise.  Repeat __________ times. Complete this exercise __________ times a day.  Exercise D: Alternating arm and leg raises     1. Get on your hands and knees on a firm surface. If you are on a hard floor, you may want to use padding to cushion your knees, such as an exercise mat.  2. Line up your arms and legs. Your hands should be below your shoulders, and your knees should be below your hips.  3. Lift your left leg behind you. At the same time, raise your right arm and straighten it in front of you.  ? Do not lift your leg higher than your hip.  ? Do not lift your arm higher than your shoulder.  ? Keep your abdominal and back   muscles tight.  ? Keep your hips facing the ground.  ? Do not arch your back.  ? Keep your balance carefully, and do not hold your breath.  4. Hold for __________ seconds.  5. Slowly return to the starting position and repeat with your right leg and your left arm.  Repeat __________ times. Complete this exercise __________times a day.  Exercise J: Single leg lower with bent knees   1. Lie on your back on a firm surface.  2. Tense your abdominal muscles and lift your feet off the floor, one foot at a time, so your knees and hips are bent in an "L" shape (at about 90 degrees).  ? Your knees should be over your hips and your lower legs should be parallel to the floor.  3. Keeping your abdominal muscles tense and your knee bent, slowly lower one of your legs so your toe touches the ground.  4. Lift  your leg back up to return to the starting position.  ? Do not hold your breath.  ? Do not let your back arch. Keep your back flat against the ground.  5. Repeat with your other leg.  Repeat __________ times. Complete this exercise __________ times a day.  Posture and body mechanics     Body mechanics refers to the movements and positions of your body while you do your daily activities. Posture is part of body mechanics. Good posture and healthy body mechanics can help to relieve stress in your body's tissues and joints. Good posture means that your spine is in its natural S-curve position (your spine is neutral), your shoulders are pulled back slightly, and your head is not tipped forward. The following are general guidelines for applying improved posture and body mechanics to your everyday activities.  Standing     · When standing, keep your spine neutral and your feet about hip-width apart. Keep a slight bend in your knees. Your ears, shoulders, and hips should line up.  · When you do a task in which you stand in one place for a long time, place one foot up on a stable object that is 2-4 inches (5-10 cm) high, such as a footstool. This helps keep your spine neutral.  Sitting     · When sitting, keep your spine neutral and keep your feet flat on the floor. Use a footrest, if necessary, and keep your thighs parallel to the floor. Avoid rounding your shoulders, and avoid tilting your head forward.  · When working at a desk or a computer, keep your desk at a height where your hands are slightly lower than your elbows. Slide your chair under your desk so you are close enough to maintain good posture.  · When working at a computer, place your monitor at a height where you are looking straight ahead and you do not have to tilt your head forward or downward to look at the screen.  Resting     · When lying down and resting, avoid positions that are most painful for you.  · If you have pain with activities such as sitting,  bending, stooping, or squatting (flexion-based activities), lie in a position in which your body does not bend very much. For example, avoid curling up on your side with your arms and knees near your chest (fetal position).  · If you have pain with activities such as standing for a long time or reaching with your arms (extension-based activities), lie with your spine in a   neutral position and bend your knees slightly. Try the following positions:  ? Lying on your side with a pillow between your knees.  ? Lying on your back with a pillow under your knees.  Lifting     · When lifting objects, keep your feet at least shoulder-width apart and tighten your abdominal muscles.  · Bend your knees and hips and keep your spine neutral. It is important to lift using the strength of your legs, not your back. Do not lock your knees straight out.  · Always ask for help to lift heavy or awkward objects.  This information is not intended to replace advice given to you by your health care provider. Make sure you discuss any questions you have with your health care provider.  Document Released: 04/29/2005 Document Revised: 01/04/2016 Document Reviewed: 02/08/2015  Elsevier Interactive Patient Education © 2017 [employer]

## 2016-08-22 ENCOUNTER — Ambulatory Visit (INDEPENDENT_AMBULATORY_CARE_PROVIDER_SITE_OTHER): Payer: BC Managed Care – PPO | Admitting: *Deleted

## 2016-08-22 DIAGNOSIS — E291 Testicular hypofunction: Secondary | ICD-10-CM | POA: Diagnosis not present

## 2016-08-22 MED ORDER — TESTOSTERONE CYPIONATE 200 MG/ML IM SOLN
400.0000 mg | Freq: Once | INTRAMUSCULAR | Status: AC
  Administered 2016-08-22: 400 mg via INTRAMUSCULAR

## 2016-08-22 NOTE — Progress Notes (Signed)
Patient presents for testosterone injection for hypogonadism Tx.  Patient received 2 cc IM left glut and tolerated well.   

## 2016-08-23 ENCOUNTER — Ambulatory Visit: Payer: Self-pay

## 2016-08-26 ENCOUNTER — Ambulatory Visit (INDEPENDENT_AMBULATORY_CARE_PROVIDER_SITE_OTHER): Payer: BC Managed Care – PPO | Admitting: Physician Assistant

## 2016-08-26 ENCOUNTER — Encounter: Payer: Self-pay | Admitting: Physician Assistant

## 2016-08-26 VITALS — BP 140/82 | HR 90 | Temp 98.9°F | Resp 97 | Wt 209.0 lb

## 2016-08-26 DIAGNOSIS — T7840XA Allergy, unspecified, initial encounter: Secondary | ICD-10-CM | POA: Diagnosis not present

## 2016-08-26 MED ORDER — AZELASTINE HCL 0.1 % NA SOLN: 2.0000 | Freq: Two times a day (BID) | NASAL | 2 refills | Status: DC

## 2016-08-26 MED ORDER — AZITHROMYCIN 250 MG PO TABS: ORAL_TABLET | ORAL | 1 refills | Status: AC

## 2016-08-26 MED ORDER — NEOMYCIN-POLYMYXIN-DEXAMETH 3.5-10000-0.1 OP SUSP: 1.0000 [drp] | Freq: Four times a day (QID) | OPHTHALMIC | 1 refills | Status: DC

## 2016-08-26 NOTE — Progress Notes (Signed)
   Subjective:    Patient ID: Perry Evans, male    DOB: Dec 15, 1950, 66 y.o.   MRN: [id number]  HPI 66 y.o. WM presents with allergy symptoms x 1 week ago.  He has watery eyes, rhinorrhea, sore throat, decreased hearing, ear fullness.   Blood pressure 140/82, pulse 90, temperature 98.9 F (37.2 C), resp. rate (!) 97, weight 209 lb (94.8 kg).  Medications Current Outpatient Prescriptions on File Prior to Visit  Medication Sig  . aspirin 81 MG tablet Take 81 mg by mouth daily.  . Cholecalciferol (VITAMIN D PO) Take 5,000 Units by mouth daily.  . cyclobenzaprine (FLEXERIL) 10 MG tablet Take 1 tablet (10 mg total) by mouth 3 (three) times daily as needed for muscle spasms.  . Flaxseed, Linseed, (FLAXSEED OIL PO) Take by mouth. Takes 1 daily  . fluticasone (FLONASE) 50 MCG/ACT nasal spray Place 2 sprays into both nostrils daily.  . meloxicam (MOBIC) 15 MG tablet Take 1 tablet (15 mg total) by mouth daily.  . Multiple Vitamins-Minerals (MULTIVITAMIN ADULT PO) Take 1 tablet by mouth daily.  Marland Kitchen testosterone cypionate (DEPOTESTOSTERONE CYPIONATE) 200 MG/ML injection INJECT 2 MLS INTRAMUSCULARLY EVERY 2 WEEKS   No current facility-administered medications on file prior to visit.     Problem list He has Essential hypertension; Mixed hyperlipidemia; Prediabetes; Vitamin D deficiency; and Medication management on his problem list.  Review of Systems  Constitutional: Negative for chills, diaphoresis and fever.  HENT: Positive for congestion, sinus pressure, sneezing and sore throat. Negative for ear pain, trouble swallowing and voice change.   Eyes: Negative.   Respiratory: Positive for cough. Negative for chest tightness, shortness of breath and wheezing.   Cardiovascular: Negative.   Gastrointestinal: Negative.   Genitourinary: Negative.   Musculoskeletal: Negative for neck pain.  Neurological: Negative.  Negative for headaches.      Objective:   Physical Exam  Constitutional: He is  oriented to person, place, and time. He appears well-developed and well-nourished.  HENT:  Head: Normocephalic and atraumatic.  Right Ear: External ear normal.  Left Ear: External ear normal.  Mouth/Throat: Oropharynx is clear and moist.  Eyes: Conjunctivae and EOM are normal. Pupils are equal, round, and reactive to light.  Neck: Normal range of motion. Neck supple.  Cardiovascular: Normal rate, regular rhythm and normal heart sounds.   Pulmonary/Chest: Effort normal and breath sounds normal.  Abdominal: Soft. Bowel sounds are normal.  Musculoskeletal: Normal range of motion.  Neurological: He is alert and oriented to person, place, and time. No cranial nerve deficit.  Skin: Skin is warm and dry.  Psychiatric: He has a normal mood and affect. His behavior is normal.      Assessment & Plan:  1. Allergic state, initial encounter Will hold the zpak and take if she is not getting better, increase fluids, rest, cont allergy pill

## 2016-08-26 NOTE — Patient Instructions (Addendum)
Can use astelin nasal spray as needed Do the flonase still, see instructions below  Take prednisone like this 2 tablets daily for 3 days, 1 tablet daily for 4 days.  Here are things you can do to help with this: - Try the Flonase or Nasonex. Remember to spray each nostril twice towards the outer part of your eye.  Do not sniff but instead pinch your nose and tilt your head forward to help the medicine get into your sinuses.  The best time to do this is at bedtime.Stop if you get blurred vision or nose bleeds.  -While drinking fluids, pinch and hold nose close and swallow, to help open eustachian tubes to drain fluid behind ear drums.  -Please pick one of the over the counter allergy medications below and take it once daily for allergies.  It will also help with fluid behind ear drums. Zyrtec or certizine at night because it can make you sleepy The strongest is allegra or fexafinadine  Cheapest at [facility], [employer], [facility] -can use decongestant over the counter, please do not use if you have high blood pressure or certain heart conditions.   if worsening HA, changes vision/speech, imbalance, weakness go to the ER  Make sure you are on an allergy pill, see below for more details. Please take the prednisone as directed below, this is NOT an antibiotic so you do NOT have to finish it. You can take it for a few days and stop it if you are doing better.   Please take the prednisone to help decrease inflammation and therefore decrease symptoms. Take it it with food to avoid GI upset. It can cause increased energy but on the other hand it can make it hard to sleep at night so please take it AT NIGHT WITH DINNER, it takes 8-12 hours to start working so it will NOT affect your sleeping if you take it at night with your food!!  If you are diabetic it will increase your sugars so decrease carbs and monitor your sugars closely.    HOW TO TREAT VIRAL COUGH AND COLD SYMPTOMS:  -Symptoms usually last at least  1 week with the worst symptoms being around day 4.  - colds usually start with a sore throat and end with a cough, and the cough can take 2 weeks to get better.  -No antibiotics are needed for colds, flu, sore throats, cough, bronchitis UNLESS symptoms are longer than 7 days OR if you are getting better then get drastically worse.  -There are a lot of combination medications (Dayquil, Nyquil, Vicks 44, tyelnol cold and sinus, ETC). Please look at the ingredients on the back so that you are treating the correct symptoms and not doubling up on medications/ingredients.    Medicines you can use  Nasal congestion  - pseudoephedrine (Sudafed)- behind the counter, do not use if you have high blood pressure, medicine that have -D in them.  - phenylephrine (Sudafed PE) -Dextormethorphan + chlorpheniramine (Coridcidin HBP)- okay if you have high blood pressure -Oxymetazoline (Afrin) nasal spray- LIMIT to 3 days -Saline nasal spray -Neti pot (used distilled or bottled water)  Ear pain/congestion  -pseudoephedrine (sudafed) - Nasonex/flonase nasal spray  Fever  -Acetaminophen (Tyelnol) -Ibuprofen (Advil, motrin, aleve)  Sore Throat  -Acetaminophen (Tyelnol) -Ibuprofen (Advil, motrin, aleve) -Drink a lot of water -Gargle with salt water - Rest your voice (don't talk) -Throat sprays -Cough drops  Body Aches  -Acetaminophen (Tyelnol) -Ibuprofen (Advil, motrin, aleve)  Headache  -Acetaminophen (Tyelnol) -Ibuprofen (  Advil, motrin, aleve) - Exedrin, Exedrin Migraine  Allergy symptoms (cough, sneeze, runny nose, itchy eyes) -Claritin or loratadine cheapest but likely the weakest  -Zyrtec or certizine at night because it can make you sleepy -The strongest is allegra or fexafinadine  Cheapest at [facility], [employer], [facility]  Cough  -Dextromethorphan (Delsym)- medicine that has DM in it -Guafenesin (Mucinex/Robitussin) - cough drops - drink lots of water  Chest Congestion  -Guafenesin  (Mucinex/Robitussin)  Red Itchy Eyes  - Naphcon-A  Upset Stomach  - Bland diet (nothing spicy, greasy, fried, and high acid foods like tomatoes, oranges, berries) -OKAY- cereal, bread, soup, crackers, rice -Eat smaller more frequent meals -reduce caffeine, no alcohol -Loperamide (Imodium-AD) if diarrhea -Prevacid for heart burn  General health when sick  -Hydration -wash your hands frequently -keep surfaces clean -change pillow cases and sheets often -Get fresh air but do not exercise strenuously -Vitamin D, double up on it - Vitamin C -Zinc

## 2016-09-02 ENCOUNTER — Other Ambulatory Visit: Payer: Self-pay | Admitting: Physician Assistant

## 2016-09-02 MED ORDER — PREDNISONE 20 MG PO TABS: ORAL_TABLET | ORAL | 0 refills | Status: DC

## 2016-09-02 MED ORDER — AZITHROMYCIN 250 MG PO TABS: ORAL_TABLET | ORAL | 1 refills | Status: AC

## 2016-09-12 ENCOUNTER — Ambulatory Visit: Payer: Self-pay

## 2016-09-15 NOTE — Progress Notes (Signed)
[facility]   Lucky Cowboy, M.D.      Dyanne Carrel. Steffanie Dunn, P.A.-C [facility]                [address]                [place], [place]. [address] Telephone [phone] Telefax [phone] Annual  Screening/Preventative Visit  & Comprehensive Evaluation & Examination     This very nice 66 y.o. MWM presents for a Screening/Preventative Visit & comprehensive evaluation and management of multiple medical co-morbidities.  Patient has been followed for labile HTN, Prediabetes, Hyperlipidemia and Vitamin D Deficiency.     Patient has been followed expectantly since 2007 for labile HTN.  Patient's BP has been controlled at home.  Today's BP is at goal - 132/74. Patient denies any cardiac symptoms as chest pain, palpitations, shortness of breath, dizziness or ankle swelling.     Patient's hyperlipidemia is controlled with diet and medications. Patient denies myalgias or other medication SE's. Last lipids were at goal albeit sl elevated Trig's: Lab Results  Component Value Date   CHOL 150 08/15/2015   HDL 31 (L) 08/15/2015   LDLCALC 82 08/15/2015   TRIG 186 (H) 08/15/2015   CHOLHDL 4.8 08/15/2015      Patient also has been monitored proactively for preDiabetes as he has hx/o abnormal glucose.  Patient denies reactive hypoglycemic symptoms, visual blurring, diabetic polys or paresthesias. Last A1c was at goal: Lab Results  Component Value Date   HGBA1C 5.3 08/15/2015       Patient has hx/o Low Testosterone predating since 2012 with levels of 132, 188 and 250. Finally, patient has history of Vitamin D Deficiency ("32" in 2010) and last vitamin D was still  very low: Lab Results  Component Value Date   VD25OH 37 08/15/2015   Current Outpatient Prescriptions on File Prior to Visit  Medication Sig  . aspirin 81 MG  Take 81 mg by mouth daily.  . ASTELIN nasal spray  2 sprays into nostrils 2 x daily.  Marland Kitchen VITAMIN D Take  5,000 Units by mouth daily.  Marland Kitchen FLAXSEED OIL  Take by mouth. Takes 1 daily  . FLONASE nasal spray Place 2 sprays into both nostrils daily.  . Multiple Vitamins-Minerals  Take 1 tablet by mouth daily.  Marland Kitchen MAXITROL SUSP Place 1 drop into both eyes 4 (four) times daily.  Marland Kitchen testosterone cypio 200 MG INJECT 2 MLS IM EVERY 2 WEEKS    Allergies  Allergen Reactions  . Codeine    Past Medical History:  Diagnosis Date  . Arthritis   . Cataract   . Hearing aid worn    Health Maintenance  Topic Date Due  . COLONOSCOPY  06/07/2015  . PNA vac Low Risk Adult (1 of 2 - PCV13) 09/15/2015  . INFLUENZA VACCINE  12/11/2016  . TETANUS/TDAP  08/27/2018  . Hepatitis C Screening  Completed   Immunization History  Administered Date(s) Administered  . DT 05/13/1996  . Hepatitis A 05/13/2009  . PPD Test 06/03/2013, 07/19/2014  . Tdap 08/26/2008   Past Surgical History:  Procedure Laterality Date  . EYE SURGERY    . PANCREAS SURGERY     Social History   Social History  . Marital status: Married    Spouse name: N/A  . Number of children: N/A  . Years of education: N/A   Occupational History  . Not on file.   Social History Main  Topics  . Smoking status: Former Smoker    Quit date: 05/13/1998  . Smokeless tobacco: Never Used  . Alcohol use No  . Drug use: Unknown  . 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.    ROS Constitutional: Denies fever, chills, weight loss/gain, headaches, insomnia,  night sweats or change in appetite. Does c/o fatigue. Eyes: Denies redness, blurred vision, diplopia, discharge, itchy or watery eyes.  ENT: Denies discharge, congestion, post nasal drip, epistaxis, sore throat, earache, hearing loss, dental pain, Tinnitus, Vertigo, Sinus pain or snoring.  Cardio: Denies chest pain, palpitations, irregular heartbeat, syncope, dyspnea, diaphoresis, orthopnea, PND, claudication or  edema Respiratory: denies cough, dyspnea, DOE, pleurisy, hoarseness, laryngitis or wheezing.  Gastrointestinal: Denies dysphagia, heartburn, reflux, water brash, pain, cramps, nausea, vomiting, bloating, diarrhea, constipation, hematemesis, melena, hematochezia, jaundice or hemorrhoids Genitourinary: Denies dysuria, frequency, urgency, nocturia, hesitancy, discharge, hematuria or flank pain Musculoskeletal: Denies arthralgia, myalgia, stiffness, Jt. Swelling, pain, limp or strain/sprain. Denies Falls. Skin: Denies puritis, rash, hives, warts, acne, eczema or change in skin lesion Neuro: No weakness, tremor, incoordination, spasms, paresthesia or pain Psychiatric: Denies confusion, memory loss or sensory loss. Denies Depression. Endocrine: Denies change in weight, skin, hair change, nocturia, and paresthesia, diabetic polys, visual blurring or hyper / hypo glycemic episodes.  Heme/Lymph: No excessive bleeding, bruising or enlarged lymph nodes.  Physical Exam  BP 132/74   Pulse 76   Temp 97.7 F (36.5 C)   Resp 16   Ht 5\' 11"  (1.803 m)   Wt 198 lb 12.8 oz (90.2 kg)   BMI 27.73 kg/m   General Appearance: Well nourished and well groomed and in no apparent distress.  Eyes: PERRLA, EOMs, conjunctiva no swelling or erythema, normal fundi and vessels. Sinuses: No frontal/maxillary tenderness ENT/Mouth: EACs patent / TMs  nl. Nares clear without erythema, swelling, mucoid exudates. Oral hygiene is good. No erythema, swelling, or exudate. Tongue normal, non-obstructing. Tonsils not swollen or erythematous. Hearing normal.  Neck: Supple, thyroid normal. No bruits, nodes or JVD. Respiratory: Respiratory effort normal.  BS equal and clear bilateral without rales, rhonci, wheezing or stridor. Cardio: Heart sounds are normal with regular rate and rhythm and no murmurs, rubs or gallops. Peripheral pulses are normal and equal bilaterally without edema. No aortic or femoral bruits. Chest: symmetric with  normal excursions and percussion.  Abdomen: Soft, with Nl bowel sounds. Nontender, no guarding, rebound, hernias, masses, or organomegaly.  Lymphatics: Non tender without lymphadenopathy.  Genitourinary: No hernias.Testes nl. DRE - prostate nl for age - smooth & firm w/o nodules. Musculoskeletal: Full ROM all peripheral extremities, joint stability, 5/5 strength, and normal gait. Skin: Warm and dry without rashes, lesions, cyanosis, clubbing or  ecchymosis.  Neuro: Cranial nerves intact, reflexes equal bilaterally. Normal muscle tone, no cerebellar symptoms. Sensation intact.  Pysch: Alert and oriented X 3 with normal affect, insight and judgment appropriate.   Assessment and Plan  1. Annual Preventative/Screening Exam   2. Essential hypertension  - EKG 12-Lead - US, RETROPERITNL ABD,  LTD - Urinalysis, Routine w reflex microscopic - Microalbumin / creatinine urine ratio - CBC with Differential/Platelet - BASIC METABOLIC PANEL WITH GFR - Magnesium - TSH  3. Hyperlipidemia, mixed  - EKG 12-Lead - US, RETROPERITNL ABD,  LTD - Hepatic function panel - Lipid panel - TSH  4. Prediabetes  - EKG 12-Lead - US, RETROPERITNL ABD,  LTD - Hemoglobin A1c - Insulin, random  5.  Vitamin D deficiency  - VITAMIN D 25 Hydroxy   6. Abnormal glucose  - Hemoglobin A1c - Insulin, random  7. Testosterone deficiency  - Testosterone  8. Screening for rectal cancer  - POC Hemoccult Bld/Stl   9. Prostate cancer screening  - PSA  10. Screening for ischemic heart disease  - EKG 12-Lead  11. Screening for AAA (abdominal aortic aneurysm)  - [place], RETROPERITNL ABD,  LTD  12. Fatigue, unspecified type  - Testosterone - CBC with Differential/Platelet - TSH  13. Medication management  - Urinalysis, Routine w reflex microscopic - Microalbumin / creatinine urine ratio - CBC with Differential/Platelet - BASIC METABOLIC PANEL WITH GFR - Hepatic function panel - Magnesium -  Lipid panel - TSH - Hemoglobin A1c - Insulin, random - VITAMIN D 25 Hydroxy        Patient was counseled in prudent diet, weight control to achieve/maintain BMI less than 25, BP monitoring, regular exercise and medications as discussed.  Discussed med effects and SE's. Routine screening labs and tests as requested with regular follow-up as recommended. Over 40 minutes of exam, counseling, chart review and high complex critical decision making was performed

## 2016-09-16 ENCOUNTER — Ambulatory Visit (INDEPENDENT_AMBULATORY_CARE_PROVIDER_SITE_OTHER): Payer: BC Managed Care – PPO | Admitting: Internal Medicine

## 2016-09-16 ENCOUNTER — Encounter: Payer: Self-pay | Admitting: Internal Medicine

## 2016-09-16 VITALS — BP 132/74 | HR 76 | Temp 97.7°F | Resp 16 | Ht 71.0 in | Wt 198.8 lb

## 2016-09-16 DIAGNOSIS — Z79899 Other long term (current) drug therapy: Secondary | ICD-10-CM

## 2016-09-16 DIAGNOSIS — Z125 Encounter for screening for malignant neoplasm of prostate: Secondary | ICD-10-CM | POA: Diagnosis not present

## 2016-09-16 DIAGNOSIS — R5383 Other fatigue: Secondary | ICD-10-CM

## 2016-09-16 DIAGNOSIS — E559 Vitamin D deficiency, unspecified: Secondary | ICD-10-CM

## 2016-09-16 DIAGNOSIS — Z0000 Encounter for general adult medical examination without abnormal findings: Secondary | ICD-10-CM | POA: Diagnosis not present

## 2016-09-16 DIAGNOSIS — Z111 Encounter for screening for respiratory tuberculosis: Secondary | ICD-10-CM

## 2016-09-16 DIAGNOSIS — I10 Essential (primary) hypertension: Secondary | ICD-10-CM | POA: Diagnosis not present

## 2016-09-16 DIAGNOSIS — E782 Mixed hyperlipidemia: Secondary | ICD-10-CM

## 2016-09-16 DIAGNOSIS — R7309 Other abnormal glucose: Secondary | ICD-10-CM

## 2016-09-16 DIAGNOSIS — Z1212 Encounter for screening for malignant neoplasm of rectum: Secondary | ICD-10-CM

## 2016-09-16 DIAGNOSIS — Z0001 Encounter for general adult medical examination with abnormal findings: Secondary | ICD-10-CM

## 2016-09-16 DIAGNOSIS — Z136 Encounter for screening for cardiovascular disorders: Secondary | ICD-10-CM | POA: Diagnosis not present

## 2016-09-16 DIAGNOSIS — E349 Endocrine disorder, unspecified: Secondary | ICD-10-CM

## 2016-09-16 DIAGNOSIS — R7303 Prediabetes: Secondary | ICD-10-CM

## 2016-09-16 LAB — CBC WITH DIFFERENTIAL/PLATELET
BASOS ABS: 0 {cells}/uL (ref 0–200)
Basophils Relative: 0 %
EOS PCT: 0 %
Eosinophils Absolute: 0 cells/uL — ABNORMAL LOW (ref 15–500)
HCT: 53.7 % — ABNORMAL HIGH (ref 38.5–50.0)
Hemoglobin: 18.5 g/dL — ABNORMAL HIGH (ref 13.2–17.1)
LYMPHS ABS: 856 {cells}/uL (ref 850–3900)
Lymphocytes Relative: 8 %
MCH: 31.1 pg (ref 27.0–33.0)
MCHC: 34.5 g/dL (ref 32.0–36.0)
MCV: 90.4 fL (ref 80.0–100.0)
MONO ABS: 107 {cells}/uL — AB (ref 200–950)
MPV: 10.5 fL (ref 7.5–12.5)
Monocytes Relative: 1 %
NEUTROS ABS: 9737 {cells}/uL — AB (ref 1500–7800)
NEUTROS PCT: 91 %
PLATELETS: 221 10*3/uL (ref 140–400)
RBC: 5.94 MIL/uL — AB (ref 4.20–5.80)
RDW: 13.9 % (ref 11.0–15.0)
WBC: 10.7 10*3/uL (ref 3.8–10.8)

## 2016-09-16 LAB — TSH: TSH: 0.65 mIU/L (ref 0.40–4.50)

## 2016-09-16 LAB — PSA: PSA: 0.9 ng/mL (ref <=4.0)

## 2016-09-16 NOTE — Patient Instructions (Signed)

## 2016-09-17 LAB — INSULIN, RANDOM: Insulin: 27 u[IU]/mL — ABNORMAL HIGH (ref 2.0–19.6)

## 2016-09-17 LAB — HEPATIC FUNCTION PANEL
ALT: 27 U/L (ref 9–46)
AST: 21 U/L (ref 10–35)
Albumin: 4.4 g/dL (ref 3.6–5.1)
Alkaline Phosphatase: 65 U/L (ref 40–115)
BILIRUBIN DIRECT: 0.1 mg/dL (ref <=0.2)
BILIRUBIN TOTAL: 0.6 mg/dL (ref 0.2–1.2)
Indirect Bilirubin: 0.5 mg/dL (ref 0.2–1.2)
Total Protein: 6.6 g/dL (ref 6.1–8.1)

## 2016-09-17 LAB — BASIC METABOLIC PANEL WITH GFR
BUN: 22 mg/dL (ref 7–25)
CALCIUM: 10.3 mg/dL (ref 8.6–10.3)
CO2: 22 mmol/L (ref 20–31)
CREATININE: 1.15 mg/dL (ref 0.70–1.25)
Chloride: 102 mmol/L (ref 98–110)
GFR, EST AFRICAN AMERICAN: 76 mL/min (ref >=60)
GFR, Est Non African American: 66 mL/min (ref >=60)
Glucose, Bld: 182 mg/dL — ABNORMAL HIGH (ref 65–99)
Potassium: 4.9 mmol/L (ref 3.5–5.3)
SODIUM: 138 mmol/L (ref 135–146)

## 2016-09-17 LAB — LIPID PANEL
CHOL/HDL RATIO: 3.8 ratio (ref <5.0)
CHOLESTEROL: 169 mg/dL (ref <200)
HDL: 44 mg/dL (ref >40)
LDL Cholesterol: 95 mg/dL (ref <100)
Triglycerides: 149 mg/dL (ref <150)
VLDL: 30 mg/dL (ref <30)

## 2016-09-17 LAB — MAGNESIUM: MAGNESIUM: 2.2 mg/dL (ref 1.5–2.5)

## 2016-09-17 LAB — TESTOSTERONE: TESTOSTERONE: 58 ng/dL — AB (ref 250–827)

## 2016-09-17 LAB — HEMOGLOBIN A1C
HEMOGLOBIN A1C: 5.2 % (ref <5.7)
Mean Plasma Glucose: 103 mg/dL

## 2016-09-17 LAB — VITAMIN D 25 HYDROXY (VIT D DEFICIENCY, FRACTURES): VIT D 25 HYDROXY: 56 ng/mL (ref 30–100)

## 2016-09-18 ENCOUNTER — Ambulatory Visit (INDEPENDENT_AMBULATORY_CARE_PROVIDER_SITE_OTHER): Payer: BC Managed Care – PPO

## 2016-09-18 ENCOUNTER — Encounter: Payer: Self-pay | Admitting: *Deleted

## 2016-09-18 DIAGNOSIS — E291 Testicular hypofunction: Secondary | ICD-10-CM

## 2016-09-18 DIAGNOSIS — Z79899 Other long term (current) drug therapy: Secondary | ICD-10-CM

## 2016-09-18 LAB — TB SKIN TEST
INDURATION: 0 mm
TB Skin Test: NEGATIVE

## 2016-09-18 MED ORDER — TESTOSTERONE CYPIONATE 200 MG/ML IM SOLN
200.0000 mg | INTRAMUSCULAR | Status: DC
  Administered 2016-09-18: 200 mg via INTRAMUSCULAR

## 2016-09-18 NOTE — Progress Notes (Signed)
Patient presents for testosterone injection for hypogonadism Tx. Patient received 2 cc IM RIGHT glut and tolerated well.  

## 2016-09-19 LAB — URINALYSIS, ROUTINE W REFLEX MICROSCOPIC
BILIRUBIN URINE: NEGATIVE
Glucose, UA: NEGATIVE
HGB URINE DIPSTICK: NEGATIVE
KETONES UR: NEGATIVE
Leukocytes, UA: NEGATIVE
Nitrite: NEGATIVE
PROTEIN: NEGATIVE
Specific Gravity, Urine: 1.022 (ref 1.001–1.035)
pH: 5.5 (ref 5.0–8.0)

## 2016-09-25 NOTE — Progress Notes (Signed)
Pt aware of lab results & voiced understanding of those results.

## 2016-10-09 ENCOUNTER — Ambulatory Visit (INDEPENDENT_AMBULATORY_CARE_PROVIDER_SITE_OTHER): Payer: BC Managed Care – PPO | Admitting: *Deleted

## 2016-10-09 DIAGNOSIS — E291 Testicular hypofunction: Secondary | ICD-10-CM

## 2016-10-09 MED ORDER — TESTOSTERONE CYPIONATE 200 MG/ML IM SOLN
400.0000 mg | Freq: Once | INTRAMUSCULAR | Status: AC
  Administered 2016-10-09: 400 mg via INTRAMUSCULAR

## 2016-10-09 NOTE — Progress Notes (Signed)
Patient here for a NV for his Testosterone Cypionate injection 200 mg/ml 2 ml IM left upper outer quadrant.  Tolerated well.

## 2016-10-24 ENCOUNTER — Other Ambulatory Visit: Payer: Self-pay | Admitting: Internal Medicine

## 2016-10-29 ENCOUNTER — Ambulatory Visit (INDEPENDENT_AMBULATORY_CARE_PROVIDER_SITE_OTHER): Payer: BC Managed Care – PPO | Admitting: Internal Medicine

## 2016-10-29 VITALS — BP 132/74 | HR 72 | Temp 97.7°F | Resp 16 | Ht 71.0 in | Wt 203.6 lb

## 2016-10-29 DIAGNOSIS — L570 Actinic keratosis: Secondary | ICD-10-CM | POA: Diagnosis not present

## 2016-10-29 DIAGNOSIS — L918 Other hypertrophic disorders of the skin: Secondary | ICD-10-CM | POA: Diagnosis not present

## 2016-10-30 ENCOUNTER — Other Ambulatory Visit: Payer: Self-pay | Admitting: Internal Medicine

## 2016-10-30 MED ORDER — FINASTERIDE 5 MG PO TABS: ORAL_TABLET | ORAL | 3 refills | Status: DC

## 2016-10-30 NOTE — Progress Notes (Signed)
  Subjective:    Patient ID: Perry Evans, male    DOB: 04/24/1951, 66 y.o.   MRN: [id number]  HPI  Patient present for removal of a very irritating pedunculate 5 mm skin tag from the medial rt lower eye lid and 3 raised shiny pink lesions approx 3 x 5 mm over the mid anterior chest that appeared to be early BCE's.   Review of Systems  10 point systems review negative except as above.     Objective:   Physical Exam  BP 132/74   Pulse 72   Temp 97.7 F (36.5 C)   Resp 16   Ht 5\' 11"  (1.803 m)   Wt 203 lb 9.6 oz (92.4 kg)   BMI 28.40 kg/m     After informed consent and aseptic prep with alcohol the 3 suspected BCE's or the chest were anesthetized with 0.3 ml lidocaine 1% and then sharply excised with a #10 scalpel ant then the wound bases were deeply hyfrecated to destroy any remaining lesion residua and sterile band aid were applied.     Then the 5 mm pedunculated irritated skin tag of the medial lower Rt eye lid was likewise excised and wound base excised and then cauterized with the Hyfrecator.      Assessment & Plan:   1. Multiple actinic keratoses  -  Procedure CPT 17000 x 3   2. Skin tag  -  Procedure 17000  +++++++++++++++++++++++++++++  - Patient was instructed in wound care .

## 2016-11-04 ENCOUNTER — Other Ambulatory Visit: Payer: Self-pay | Admitting: *Deleted

## 2016-11-04 MED ORDER — FINASTERIDE 5 MG PO TABS: ORAL_TABLET | ORAL | 3 refills | Status: DC

## 2016-12-16 ENCOUNTER — Encounter: Payer: Self-pay | Admitting: Physician Assistant

## 2016-12-16 ENCOUNTER — Ambulatory Visit (INDEPENDENT_AMBULATORY_CARE_PROVIDER_SITE_OTHER): Payer: BC Managed Care – PPO | Admitting: Physician Assistant

## 2016-12-16 VITALS — BP 122/80 | HR 74 | Temp 97.9°F | Resp 16 | Ht 71.0 in | Wt 200.0 lb

## 2016-12-16 DIAGNOSIS — R0789 Other chest pain: Secondary | ICD-10-CM

## 2016-12-16 DIAGNOSIS — R0600 Dyspnea, unspecified: Secondary | ICD-10-CM

## 2016-12-16 LAB — BASIC METABOLIC PANEL WITH GFR
BUN: 19 mg/dL (ref 7–25)
CALCIUM: 10.2 mg/dL (ref 8.6–10.3)
CO2: 28 mmol/L (ref 20–32)
Chloride: 99 mmol/L (ref 98–110)
Creat: 0.96 mg/dL (ref 0.70–1.25)
GFR, EST NON AFRICAN AMERICAN: 82 mL/min (ref >=60)
Glucose, Bld: 104 mg/dL — ABNORMAL HIGH (ref 65–99)
POTASSIUM: 4.3 mmol/L (ref 3.5–5.3)
SODIUM: 139 mmol/L (ref 135–146)

## 2016-12-16 LAB — CBC WITH DIFFERENTIAL/PLATELET
BASOS ABS: 80 {cells}/uL (ref 0–200)
BASOS PCT: 1 %
EOS ABS: 240 {cells}/uL (ref 15–500)
EOS PCT: 3 %
HEMATOCRIT: 49.3 % (ref 38.5–50.0)
HEMOGLOBIN: 17.2 g/dL — AB (ref 13.2–17.1)
Lymphocytes Relative: 31 %
Lymphs Abs: 2480 cells/uL (ref 850–3900)
MCH: 31.3 pg (ref 27.0–33.0)
MCHC: 34.9 g/dL (ref 32.0–36.0)
MCV: 89.8 fL (ref 80.0–100.0)
MPV: 10.4 fL (ref 7.5–12.5)
Monocytes Absolute: 720 cells/uL (ref 200–950)
Monocytes Relative: 9 %
NEUTROS ABS: 4480 {cells}/uL (ref 1500–7800)
NEUTROS PCT: 56 %
Platelets: 223 10*3/uL (ref 140–400)
RBC: 5.49 MIL/uL (ref 4.20–5.80)
RDW: 13.8 % (ref 11.0–15.0)
WBC: 8 10*3/uL (ref 3.8–10.8)

## 2016-12-16 LAB — HEPATIC FUNCTION PANEL
ALT: 32 U/L (ref 9–46)
AST: 25 U/L (ref 10–35)
Albumin: 4.4 g/dL (ref 3.6–5.1)
Alkaline Phosphatase: 63 U/L (ref 40–115)
BILIRUBIN DIRECT: 0.1 mg/dL (ref <=0.2)
BILIRUBIN INDIRECT: 0.7 mg/dL (ref 0.2–1.2)
Total Bilirubin: 0.8 mg/dL (ref 0.2–1.2)
Total Protein: 6.6 g/dL (ref 6.1–8.1)

## 2016-12-16 MED ORDER — PANTOPRAZOLE SODIUM 40 MG PO TBEC: 40.0000 mg | DELAYED_RELEASE_TABLET | Freq: Every day | ORAL | 0 refills | Status: DC

## 2016-12-16 NOTE — Progress Notes (Signed)
Subjective:    Patient ID: Perry Evans, male    DOB: July 20, 1950, 66 y.o.   MRN: [id number]  HPI 66 y.o. WM has had recurrent chest pain. Will have nonexertional chest pain, can last for up to 10 mins, has substernal chest pain from breast to breast about 6 inches high, does not radiate, worse with deep breath in, no other symptoms. Had had last night that woke him up, has had again last Wednesday 10 AM fixing chainsaw but nothing strenuous, some SOB, no sweating, no nausea, no dizziness. No burping, beltching, bad taste in his mouth, no GERD but did start drinking cold OJ.   He push mowed Saturday, had diaphoresis and fatigue but no CP, SOB, dizziness.   He was [place] the beach with his wife, Perry Evans, June 23rd, sat in the tide for 2 hours and had left leg swelling, went to Er at [facility], had normal EKG, normal DVT US and everything was negative.   He has HTN, remote history of smoking, but very strong familial history on dad's side of MI's at young age.   Blood pressure 122/80, pulse 74, temperature 97.9 F (36.6 C), resp. rate 16, height 5\' 11"  (1.803 m), weight 200 lb (90.7 kg), SpO2 98 %.  Medications Current Outpatient Prescriptions on File Prior to Visit  Medication Sig  . aspirin 81 MG tablet Take 81 mg by mouth daily.  . Cholecalciferol (VITAMIN D PO) Take 10,000 Units by mouth daily.   . finasteride (PROSCAR) 5 MG tablet Take 1 tablet daily  . Flaxseed, Linseed, (FLAXSEED OIL PO) Take by mouth. Takes 1 daily  . Multiple Vitamins-Minerals (MULTIVITAMIN ADULT PO) Take 1 tablet by mouth daily.   Current Facility-Administered Medications on File Prior to Visit  Medication  . testosterone cypionate (DEPOTESTOSTERONE CYPIONATE) injection 200 mg    Problem list He has Essential hypertension; Mixed hyperlipidemia; Prediabetes; Vitamin D deficiency; and Medication management on his problem list.   Review of Systems  Constitutional: Positive for diaphoresis and fatigue. Negative for  activity change, appetite change, chills, fever and unexpected weight change.  HENT: Negative.   Respiratory: Positive for shortness of breath. Negative for apnea, cough, choking, chest tightness, wheezing and stridor.   Cardiovascular: Positive for chest pain and leg swelling. Negative for palpitations.  Gastrointestinal: Negative.   Genitourinary: Negative.   Musculoskeletal: Negative.   Skin: Negative.   Neurological: Negative.   Psychiatric/Behavioral: Positive for dysphoric mood (adopted son passed). Negative for agitation, behavioral problems, confusion and decreased concentration.       Objective:   Physical Exam  Constitutional: He is oriented to person, place, and time. He appears well-developed and well-nourished.  HENT:  Head: Normocephalic and atraumatic.  Right Ear: External ear normal.  Left Ear: External ear normal.  Mouth/Throat: Oropharynx is clear and moist.  Eyes: Pupils are equal, round, and reactive to light. Conjunctivae and EOM are normal.  Neck: Normal range of motion. Neck supple.  Cardiovascular: Normal rate, regular rhythm and normal heart sounds.   Pulmonary/Chest: Effort normal and breath sounds normal.  Abdominal: Soft. Bowel sounds are normal.  Musculoskeletal: Normal range of motion.  Neurological: He is alert and oriented to person, place, and time. No cranial nerve deficit.  Skin: Skin is warm and dry.  Psychiatric: He has a normal mood and affect. His behavior is normal.          Assessment & Plan:   Dyspnea, unspecified type -     CBC with Differential/Platelet -  BASIC METABOLIC PANEL WITH GFR -     Hepatic function panel -     EKG 12-Lead -     DG Chest 2 View; Future -     D-dimer, quantitative (not at [facility]) -     pantoprazole (PROTONIX) 40 MG tablet; Take 1 tablet (40 mg total) by mouth daily. -     Ambulatory referral to Cardiology  Atypical chest pain -     CBC with Differential/Platelet -     BASIC METABOLIC PANEL WITH  GFR -     Hepatic function panel -     EKG 12-Lead -     DG Chest 2 View; Future -     D[facility]dimer, quantitative ([facility]) -     pantoprazole (PROTONIX) 40 MG tablet; Take 1 tablet (40 mg total) by mouth daily. -     Ambulatory referral to Cardiology   The patient was advised to call immediately if he has any concerning symptoms in the interval. The patient voices understanding of current treatment options and is in agreement with the current care plan.The patient knows to call the clinic with any problems, questions or concerns or go to the ER if any further progression of symptoms.

## 2016-12-16 NOTE — Patient Instructions (Signed)
Stop orange juice Get on protonix x 2 weeks Go to ER if worsening chest pain, shortness of breath Get chest xray  Will send to cardio due to family history   Nonspecific Chest Pain Chest pain can be caused by many different conditions. There is always a chance that your pain could be related to something serious, such as a heart attack or a blood clot in your lungs. Chest pain can also be caused by conditions that are not life-threatening. If you have chest pain, it is very important to follow up with your health care provider. What are the causes? Causes of this condition include:  Heartburn.  Pneumonia or bronchitis.  Anxiety or stress.  Inflammation around your heart (pericarditis) or lung (pleuritis or pleurisy).  A blood clot in your lung.  A collapsed lung (pneumothorax). This can develop suddenly on its own (spontaneous pneumothorax) or from trauma to the chest.  Shingles infection (varicella-zoster virus).  Heart attack.  Damage to the bones, muscles, and cartilage that make up your chest wall. This can include: ? Bruised bones due to injury. ? Strained muscles or cartilage due to frequent or repeated coughing or overwork. ? Fracture to one or more ribs. ? Sore cartilage due to inflammation (costochondritis).  What increases the risk? Risk factors for this condition may include:  Activities that increase your risk for trauma or injury to your chest.  Respiratory infections or conditions that cause frequent coughing.  Medical conditions or overeating that can cause heartburn.  Heart disease or family history of heart disease.  Conditions or health behaviors that increase your risk of developing a blood clot.  Having had chicken pox (varicella zoster).  What are the signs or symptoms? Chest pain can feel like:  Burning or tingling on the surface of your chest or deep in your chest.  Crushing, pressure, aching, or squeezing pain.  Dull or sharp pain that  is worse when you move, cough, or take a deep breath.  Pain that is also felt in your back, neck, shoulder, or arm, or pain that spreads to any of these areas.  Your chest pain may come and go, or it may stay constant. How is this diagnosed? Lab tests or other studies may be needed to find the cause of your pain. Your health care provider may have you take a test called an ECG (electrocardiogram). An ECG records your heartbeat patterns at the time the test is performed. You may also have other tests, such as:  Transthoracic echocardiogram (TTE). In this test, sound waves are used to create a picture of the heart structures and to look at how blood flows through your heart.  Transesophageal echocardiogram (TEE).This is a more advanced imaging test that takes images from inside your body. It allows your health care provider to see your heart in finer detail.  Cardiac monitoring. This allows your health care provider to monitor your heart rate and rhythm in real time.  Holter monitor. This is a portable device that records your heartbeat and can help to diagnose abnormal heartbeats. It allows your health care provider to track your heart activity for several days, if needed.  Stress tests. These can be done through exercise or by taking medicine that makes your heart beat more quickly.  Blood tests.  Other imaging tests.  How is this treated? Treatment depends on what is causing your chest pain. Treatment may include:  Medicines. These may include: ? Acid blockers for heartburn. ? Anti-inflammatory medicine. ?  Pain medicine for inflammatory conditions. ? Antibiotic medicine, if an infection is present. ? Medicines to dissolve blood clots. ? Medicines to treat coronary artery disease (CAD).  Supportive care for conditions that do not require medicines. This may include: ? Resting. ? Applying heat or cold packs to injured areas. ? Limiting activities until pain decreases.  Follow  these instructions at home: Medicines  If you were prescribed an antibiotic, take it as told by your health care provider. Do not stop taking the antibiotic even if you start to feel better.  Take over-the-counter and prescription medicines only as told by your health care provider. Lifestyle  Do not use any products that contain nicotine or tobacco, such as cigarettes and e-cigarettes. If you need help quitting, ask your health care provider.  Do not drink alcohol.  Make lifestyle changes as directed by your health care provider. These may include: ? Getting regular exercise. Ask your health care provider to suggest some activities that are safe for you. ? Eating a heart-healthy diet. A registered dietitian can help you to learn healthy eating options. ? Maintaining a healthy weight. ? Managing diabetes, if necessary. ? Reducing stress, such as with yoga or relaxation techniques. General instructions  Avoid any activities that bring on chest pain.  If heartburn is the cause for your chest pain, raise (elevate) the head of your bed about 6 inches (15 cm) by putting blocks under the legs. Sleeping with more pillows does not effectively relieve heartburn because it only changes the position of your head.  Keep all follow-up visits as told by your health care provider. This is important. This includes any further testing if your chest pain does not go away. Contact a health care provider if:  Your chest pain does not go away.  You have a rash with blisters on your chest.  You have a fever.  You have chills. Get help right away if:  Your chest pain is worse.  You have a cough that gets worse, or you cough up blood.  You have severe pain in your abdomen.  You have severe weakness.  You faint.  You have sudden, unexplained chest discomfort.  You have sudden, unexplained discomfort in your arms, back, neck, or jaw.  You have shortness of breath at any time.  You suddenly  start to sweat, or your skin gets clammy.  You feel nauseous or you vomit.  You suddenly feel light-headed or dizzy.  Your heart begins to beat quickly, or it feels like it is skipping beats. These symptoms may represent a serious problem that is an emergency. Do not wait to see if the symptoms will go away. Get medical help right away. Call your local emergency services (911 in the [place] Do not drive yourself to the hospital. This information is not intended to replace advice given to you by your health care provider. Make sure you discuss any questions you have with your health care provider. Document Released: 02/06/2005 Document Revised: 01/22/2016 Document Reviewed: 01/22/2016 Elsevier Interactive Patient Education  2017 [employer].

## 2016-12-17 LAB — D-DIMER, QUANTITATIVE: D-Dimer, Quant: <0.19 mcg/mL FEU (ref <0.50)

## 2016-12-24 ENCOUNTER — Encounter: Payer: Self-pay | Admitting: Internal Medicine

## 2017-01-24 ENCOUNTER — Ambulatory Visit (INDEPENDENT_AMBULATORY_CARE_PROVIDER_SITE_OTHER): Payer: BC Managed Care – PPO | Admitting: Cardiovascular Disease

## 2017-01-24 ENCOUNTER — Encounter: Payer: Self-pay | Admitting: Cardiovascular Disease

## 2017-01-24 VITALS — BP 128/66 | HR 65 | Ht 72.0 in | Wt 196.0 lb

## 2017-01-24 DIAGNOSIS — R079 Chest pain, unspecified: Secondary | ICD-10-CM | POA: Diagnosis not present

## 2017-01-24 DIAGNOSIS — R0789 Other chest pain: Secondary | ICD-10-CM

## 2017-01-24 NOTE — Patient Instructions (Signed)
Medication Instructions:  Your physician recommends that you continue on your current medications as directed. Please refer to the Current Medication list given to you today.  Labwork: NONE  Testing/Procedures: Your physician has requested that you have an exercise tolerance test. For further information please visit [email or web address] Please also follow instruction sheet, as given.   Follow-Up: AS NEEDED with Dr. Allyson Sabal  Any Other Special Instructions Will Be Listed Below (If Applicable).     If you need a refill on your cardiac medications before your next appointment, please call your pharmacy.

## 2017-01-24 NOTE — Progress Notes (Signed)
01/24/2017 Perry Evans   06-Jul-1950  [id number]  Primary Physician Perry Cowboy, MD Primary Cardiologist: Perry Gess MD Perry Evans, [place]  HPI:  Perry Evans is a 66 y.o. male married Caucasian male father of 4 living children (69 year old year-old son died on 24-Nov-2016 from an IV fentanyl overdose), grandfather to 2 grandchildren who was referred by Perry Mulling PA-C for cardiovascular evaluation because of new onset atypical chest pain. 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 She was factors include 20-pack-years of tobacco abuse having quit on 4/1/1. His father did have bypass surgery at age 26 and died in his 65s. He's never had a heart attack or stroke. Unfortunately, this 11 year old son died on 24-Nov-2016 of IV fentanyl overdose and since that time he's had 3 episodes of atypical chest pain the first one being more severe, awakening awakening him from sleep. The pain is burning, substernal and does not radiate. There are no other associated symptoms. It lasted approximately 10 minutes and resolved spontaneously. He hasn't been on a proton pump inhibitor which has significantly improved his symptoms.   Current Meds  Medication Sig  . aspirin 81 MG tablet Take 81 mg by mouth daily.  . Cholecalciferol (VITAMIN D PO) Take 10,000 Units by mouth daily.   . Flaxseed, Linseed, (FLAXSEED OIL PO) Take by mouth. Takes 1 daily  . Multiple Vitamins-Minerals (MULTIVITAMIN ADULT PO) Take 1 tablet by mouth daily.  . pantoprazole (PROTONIX) 40 MG tablet Take 1 tablet (40 mg total) by mouth daily. (Patient taking differently: Take 40 mg by mouth daily as needed. )   Current Facility-Administered Medications for the 01/24/17 encounter (Office Visit) with Perry Gess, MD  Medication  . testosterone cypionate (DEPOTESTOSTERONE CYPIONATE) injection 200 mg     Allergies  Allergen Reactions  . Codeine     Social History   Social History  . Marital  status: Married    Spouse name: N/A  . Number of children: N/A  . Years of education: N/A   Occupational History  . Not on file.   Social History Main Topics  . Smoking status: Former Smoker    Quit date: 05/13/1998  . Smokeless tobacco: Never Used  . Alcohol use No  . Drug use: No  . Sexual activity: Not on file   Other Topics Concern  . Not on file   Social History Narrative   Employment: works in Teacher, English as a foreign language for [employer].     Review of Systems: General: negative for chills, fever, night sweats or weight changes.  Cardiovascular: negative for chest pain, dyspnea on exertion, edema, orthopnea, palpitations, paroxysmal nocturnal dyspnea or shortness of breath Dermatological: negative for rash Respiratory: negative for cough or wheezing Urologic: negative for hematuria Abdominal: negative for nausea, vomiting, diarrhea, bright red blood per rectum, melena, or hematemesis Neurologic: negative for visual changes, syncope, or dizziness All other systems reviewed and are otherwise negative except as noted above.    Blood pressure 128/66, pulse 65, height 6' (1.829 m), weight 196 lb (88.9 kg).  General appearance: alert, no distress and Alert and oriented Neck: no adenopathy, no carotid bruit, no JVD, supple, symmetrical, trachea midline and thyroid not enlarged, symmetric, no tenderness/mass/nodules Lungs: clear to auscultation bilaterally Heart: regular rate and rhythm, S1, S2 normal, no murmur, click, rub or gallop Extremities: extremities normal, atraumatic, no cyanosis or edema  EKG sinus rhythm at 65 without ST or T-wave changes. I personally reviewed this EKG  ASSESSMENT  AND PLAN:   Atypical chest pain Perry Evans has atypical chest pain which began soon after the loss of his son in June of this year. He has essentially no cardiac risk factors. He said approximately 3 episodes since that time the first one was the most severe and awakened him from  sleep. It was burning, substernal without radiation. He did have some pleuritic chest pain. 2 subsequent episodes were not as severe. He did get evaluated at [facility] and a lower extremity venous Doppler study ruled out DVT. Since beginning on a proton pump inhibitor his symptoms have remarkably improved. I'm going to order a routine GXT to rule out an ischemic etiology.      Perry Gess MD FACP,FACC,FAHA, [facility] 01/24/2017 8:41 AM

## 2017-01-24 NOTE — Assessment & Plan Note (Signed)
Perry Evans has atypical chest pain which began soon after the loss of his son in June of this year. He has essentially no cardiac risk factors. He said approximately 3 episodes since that time the first one was the most severe and awakened him from sleep. It was burning, substernal without radiation. He did have some pleuritic chest pain. 2 subsequent episodes were not as severe. He did get evaluated at [facility] and a lower extremity venous Doppler study ruled out DVT. Since beginning on a proton pump inhibitor his symptoms have remarkably improved. I'm going to order a routine GXT to rule out an ischemic etiology.

## 2017-02-04 ENCOUNTER — Telehealth (HOSPITAL_COMMUNITY): Payer: Self-pay

## 2017-02-04 NOTE — Telephone Encounter (Signed)
Encounter complete. 

## 2017-02-05 ENCOUNTER — Ambulatory Visit (HOSPITAL_COMMUNITY)
Admission: RE | Admit: 2017-02-05 | Discharge: 2017-02-05 | Disposition: A | Discharge status: Home / Self Care | Payer: BC Managed Care – PPO | Source: Ambulatory Visit | Attending: Cardiology | Admitting: Cardiology

## 2017-02-05 DIAGNOSIS — R030 Elevated blood-pressure reading, without diagnosis of hypertension: Secondary | ICD-10-CM | POA: Diagnosis not present

## 2017-02-05 DIAGNOSIS — R079 Chest pain, unspecified: Secondary | ICD-10-CM | POA: Diagnosis not present

## 2017-02-05 LAB — EXERCISE TOLERANCE TEST
CHL CUP MPHR: 154 {beats}/min
CHL CUP RESTING HR STRESS: 66 {beats}/min
CHL RATE OF PERCEIVED EXERTION: 17
CSEPEDS: 46 s
CSEPEW: 11.3 METS
CSEPHR: 99 %
CSEPPHR: 153 {beats}/min
Exercise duration (min): 9 min

## 2017-02-28 ENCOUNTER — Telehealth: Payer: Self-pay | Admitting: Physician Assistant

## 2017-02-28 MED ORDER — PROMETHAZINE-DM 6.25-15 MG/5ML PO SYRP: 5.0000 mL | ORAL_SOLUTION | Freq: Four times a day (QID) | ORAL | 1 refills | Status: DC | PRN

## 2017-02-28 MED ORDER — PREDNISONE 20 MG PO TABS
ORAL_TABLET | ORAL | 0 refills | Status: DC
Start: 2017-02-28 — End: 2017-03-21

## 2017-02-28 NOTE — Telephone Encounter (Signed)
Patient also complains  of possible sinusitis. Symptoms include congestion, no  fever, post nasal drip, productive cough with  green colored sputum, sore throat and headache. Onset of symptoms was 3 days ago, and has been gradually worsening since that time. Treatment to date: antibiotics and advil.  Will send in prednisone, get on allegra, nasal spray like flonase, cough syrup, need to wait 7-10 days before need antibiotic. If not better schedule OV

## 2017-03-21 ENCOUNTER — Encounter: Payer: Self-pay | Admitting: Physician Assistant

## 2017-03-21 ENCOUNTER — Ambulatory Visit: Payer: BC Managed Care – PPO | Admitting: Physician Assistant

## 2017-03-21 VITALS — BP 120/80 | HR 88 | Temp 97.7°F | Resp 16 | Ht 72.0 in | Wt 191.0 lb

## 2017-03-21 DIAGNOSIS — R0789 Other chest pain: Secondary | ICD-10-CM

## 2017-03-21 DIAGNOSIS — Z79899 Other long term (current) drug therapy: Secondary | ICD-10-CM

## 2017-03-21 DIAGNOSIS — E782 Mixed hyperlipidemia: Secondary | ICD-10-CM

## 2017-03-21 DIAGNOSIS — I10 Essential (primary) hypertension: Secondary | ICD-10-CM

## 2017-03-21 DIAGNOSIS — R0600 Dyspnea, unspecified: Secondary | ICD-10-CM

## 2017-03-21 DIAGNOSIS — R7303 Prediabetes: Secondary | ICD-10-CM | POA: Diagnosis not present

## 2017-03-21 MED ORDER — PANTOPRAZOLE SODIUM 20 MG PO TBEC: 20.0000 mg | DELAYED_RELEASE_TABLET | Freq: Every day | ORAL | 3 refills | Status: DC

## 2017-03-21 NOTE — Patient Instructions (Signed)
GETTING OFF OF PPI's    Nexium/protonix/prilosec/Omeprazole/Dexilant/Aciphex are called PPI's, they are great at healing your stomach but should only be taken for a short period of time.     Recent studies have shown that taken for a long time they  can increase the risk of osteoporosis (weakening of your bones), pneumonia, low magnesium, restless legs, Cdiff (infection that causes diarrhea), DEMENTIA and most recently kidney damage / disease / insufficiency.     Due to this information we want to try to stop the PPI but if you try to stop it abruptly this can cause rebound acid and worsening symptoms.   So this is how we want you to get off the PPI: Generic is always fine!!  - Start taking the nexium/protonix/prilosec/PPI  every other day with  zantac (ranitidine) OR pepcid famotadine 2 x a day for 2-4 weeks - some people stay on this dosage and can not taper off further. Our main goal is to limit the dosage and amount you are taking so if you need to stay on this dose.   - then decrease the PPI to every 3 days while taking the zantac or pepcid 300mg  twice a day the other  days for 2-4  Weeks  CAN ALTERNATE PROTONIX AND ZANTAC/PEPCID EVERYOTHER  - then you can try the zantac or pepcid 300mg  once at night or up to 2 x day as needed.  - you can continue on this once at night or stop all together  - Avoid alcohol, spicy foods, NSAIDS (aleve, ibuprofen) at this time. See foods below.   +++++++++++++++++++++++++++++++++++++++++++  Food Choices for Gastroesophageal Reflux Disease  When you have gastroesophageal reflux disease (GERD), the foods you eat and your eating habits are very important. Choosing the right foods can help ease the discomfort of GERD. WHAT GENERAL GUIDELINES DO I NEED TO FOLLOW?  Choose fruits, vegetables, whole grains, low-fat dairy products, and low-fat meat, fish, and poultry.  Limit fats such as oils, salad dressings, butter, nuts, and avocado.  Keep a food  diary to identify foods that cause symptoms.  Avoid foods that cause reflux. These may be different for different people.  Eat frequent small meals instead of three large meals each day.  Eat your meals slowly, in a relaxed setting.  Limit fried foods.  Cook foods using methods other than frying.  Avoid drinking alcohol.  Avoid drinking large amounts of liquids with your meals.  Avoid bending over or lying down until 2-3 hours after eating.   WHAT FOODS ARE NOT RECOMMENDED? The following are some foods and drinks that may worsen your symptoms:  Vegetables Tomatoes. Tomato juice. Tomato and spaghetti sauce. Chili peppers. Onion and garlic. Horseradish. Fruits Oranges, grapefruit, and lemon (fruit and juice). Meats High-fat meats, fish, and poultry. This includes hot dogs, ribs, ham, sausage, salami, and bacon. Dairy Whole milk and chocolate milk. Sour cream. Cream. Butter. Ice cream. Cream cheese.  Beverages Coffee and tea, with or without caffeine. Carbonated beverages or energy drinks. Condiments Hot sauce. Barbecue sauce.  Sweets/Desserts Chocolate and cocoa. Donuts. Peppermint and spearmint. Fats and Oils High-fat foods, including JamaicaFrench fries and potato chips. Other Vinegar. Strong spices, such as black pepper, white pepper, red pepper, cayenne, curry powder, cloves, ginger, and chili powder.

## 2017-03-21 NOTE — Progress Notes (Signed)
FOLLOW UP  Assessment and Plan:   Hypertension -Continue medication, monitor blood pressure at home. Continue DASH diet.  Reminder to go to the ER if any CP, SOB, nausea, dizziness, severe HA, changes vision/speech, left arm numbness and tingling and jaw pain.  GERD Continue PPI/H2 blocker, diet discussed - CAN DO PROTONIX PRN 20 MG SENT IN, OTHERWISE DO ZANTAC  Vitamin D Def  continue medications.   Grief Seeing hospice counselor, no need for meds at this time Will call if any issues  Continue diet and meds as discussed. Further disposition pending results of labs. Over 30 minutes of exam, counseling, chart review, and critical decision making was performed  Future Appointments  Date Time Provider Department Center  10/14/2017  2:00 PM Lucky CowboyMcKeown, William, MD [facility] None     HPI 66 y.o. male  presents for 3 month follow up on hypertension, cholesterol, prediabetes, and vitamin D deficiency.   Son died , Joselyn Glassmanyler, [place], right now Chip BoerVicki is not handling well, they are crying a lot, seeing hospice counselors. States he is sleeping well, eating okay, more concern for Chip BoerVicki.   His blood pressure has been controlled at home, today their BP is BP: 120/80  He does not workout. He denies chest pain, shortness of breath, dizziness. Had normal stress test, , continues to have GERD, off protonix.   He  is not  on cholesterol medication and denies myalgias. His cholesterol is at goal. The cholesterol last visit was:   Lab Results  Component Value Date   CHOL 169 09/16/2016   HDL 44 09/16/2016   LDLCALC 95 09/16/2016   TRIG 149 09/16/2016   CHOLHDL 3.8 09/16/2016    Last A1C in the office was:  Lab Results  Component Value Date   HGBA1C 5.2 09/16/2016   Patient is on Vitamin D supplement.   Lab Results  Component Value Date   VD25OH 56 09/16/2016     BMI is Body mass index is 25.9 kg/m., he is working on diet and exercise. Wt Readings from Last 3 Encounters:   03/21/17 191 lb (86.6 kg)  01/24/17 196 lb (88.9 kg)  12/16/16 200 lb (90.7 kg)     Current Medications:  Current Outpatient Medications on File Prior to Visit  Medication Sig  . aspirin 81 MG tablet Take 81 mg by mouth daily.  . Cholecalciferol (VITAMIN D PO) Take 10,000 Units by mouth daily.   . Flaxseed, Linseed, (FLAXSEED OIL PO) Take by mouth. Takes 1 daily  . Multiple Vitamins-Minerals (MULTIVITAMIN ADULT PO) Take 1 tablet by mouth daily.   Current Facility-Administered Medications on File Prior to Visit  Medication  . testosterone cypionate (DEPOTESTOSTERONE CYPIONATE) injection 200 mg    Medical History:  Past Medical History:  Diagnosis Date  . Arthritis   . Cataract   . Hearing aid worn    Allergies:  Allergies  Allergen Reactions  . Codeine      Review of Systems:  ROS  Family history- Review and unchanged Social history- Review and unchanged Physical Exam: BP 120/80   Pulse 88   Temp 97.7 F (36.5 C)   Resp 16   Ht 6' (1.829 m)   Wt 191 lb (86.6 kg)   SpO2 96%   BMI 25.90 kg/m  Wt Readings from Last 3 Encounters:  03/21/17 191 lb (86.6 kg)  01/24/17 196 lb (88.9 kg)  12/16/16 200 lb (90.7 kg)   General Appearance: Well nourished, in no apparent  distress. Eyes: PERRLA, EOMs, conjunctiva no swelling or erythema Sinuses: No Frontal/maxillary tenderness ENT/Mouth: Ext aud canals clear, TMs without erythema, bulging. No erythema, swelling, or exudate on post pharynx.  Tonsils not swollen or erythematous. Hearing normal.  Neck: Supple, thyroid normal.  Respiratory: Respiratory effort normal, BS equal bilaterally without rales, rhonchi, wheezing or stridor.  Cardio: RRR with no MRGs. Brisk peripheral pulses without edema.  Abdomen: Soft, + BS,  Non tender, no guarding, rebound, hernias, masses. Lymphatics: Non tender without lymphadenopathy.  Musculoskeletal: Full ROM, 5/5 strength, Normal gait Skin: Warm, dry without rashes, lesions, ecchymosis.   Neuro: Cranial nerves intact. Normal muscle tone, no cerebellar symptoms. Psych: Awake and oriented X 3, normal affect, Insight and Judgment appropriate.    Quentin Mulling, PA-C 8:51 AM [facility]

## 2017-05-29 ENCOUNTER — Encounter: Payer: Self-pay | Admitting: Physician Assistant

## 2017-05-29 ENCOUNTER — Ambulatory Visit: Payer: BC Managed Care – PPO | Admitting: Physician Assistant

## 2017-05-29 VITALS — BP 120/68 | HR 76 | Temp 97.5°F | Resp 16 | Ht 72.0 in | Wt 204.6 lb

## 2017-05-29 DIAGNOSIS — J309 Allergic rhinitis, unspecified: Secondary | ICD-10-CM | POA: Diagnosis not present

## 2017-05-29 MED ORDER — PREDNISONE 20 MG PO TABS: ORAL_TABLET | ORAL | 0 refills | Status: DC

## 2017-05-29 MED ORDER — AZELASTINE HCL 0.1 % NA SOLN: 2.0000 | Freq: Two times a day (BID) | NASAL | 2 refills | Status: DC

## 2017-05-29 NOTE — Progress Notes (Signed)
   Subjective:    Patient ID: Perry Evans, male    DOB: 10/03/1950, 67 y.o.   MRN: [id number]  HPI 67 y.o. former smoking WM presents with watery eyes, rhinorrhea and HA yesterday at lunch. He is using flonase. Mild cough, non productive. No fever, chills, SOB, wheezing, sinus pressure, sore throat.  CXR 2006   Blood pressure 120/68, pulse 76, temperature (!) 97.5 F (36.4 C), resp. rate 16, height 6' (1.829 m), weight 204 lb 9.6 oz (92.8 kg), SpO2 96 %.  Medications Current Outpatient Medications on File Prior to Visit  Medication Sig  . aspirin 81 MG tablet Take 81 mg by mouth daily.  . Cholecalciferol (VITAMIN D PO) Take 10,000 Units by mouth daily.   . Flaxseed, Linseed, (FLAXSEED OIL PO) Take by mouth. Takes 1 daily  . Multiple Vitamins-Minerals (MULTIVITAMIN ADULT PO) Take 1 tablet by mouth daily.  . pantoprazole (PROTONIX) 20 MG tablet Take 1 tablet (20 mg total) daily by mouth.   Current Facility-Administered Medications on File Prior to Visit  Medication  . testosterone cypionate (DEPOTESTOSTERONE CYPIONATE) injection 200 mg    Problem list He has Essential hypertension; Mixed hyperlipidemia; Prediabetes; Vitamin D deficiency; Medication management; and Atypical chest pain on their problem list.  Review of Systems  Constitutional: Negative.  Negative for chills and fever.  HENT: Positive for sneezing. Negative for congestion, dental problem, drooling, ear discharge, ear pain, facial swelling, hearing loss, mouth sores, nosebleeds, postnasal drip, rhinorrhea, sinus pressure, sore throat, tinnitus, trouble swallowing and voice change.   Eyes: Positive for discharge, redness and itching. Negative for photophobia, pain and visual disturbance.  Respiratory: Negative.   Cardiovascular: Negative.        Objective:   Physical Exam  Constitutional: He is oriented to person, place, and time. He appears well-developed and well-nourished.  HENT:  Head: Normocephalic and  atraumatic.  Right Ear: Hearing and tympanic membrane normal.  Left Ear: Hearing and tympanic membrane normal.  Nose: Right sinus exhibits maxillary sinus tenderness. Left sinus exhibits maxillary sinus tenderness.  Mouth/Throat: Uvula is midline and mucous membranes are normal. Posterior oropharyngeal erythema present. No oropharyngeal exudate, posterior oropharyngeal edema or tonsillar abscesses.  Eyes: Conjunctivae are normal. Pupils are equal, round, and reactive to light.  Neck: Normal range of motion. Neck supple.  Cardiovascular: Normal rate and regular rhythm.  Pulmonary/Chest: Effort normal and breath sounds normal.  Abdominal: Soft. Bowel sounds are normal.  Musculoskeletal: Normal range of motion.  Lymphadenopathy:    He has no cervical adenopathy.  Neurological: He is alert and oriented to person, place, and time.  Skin: Skin is warm and dry. No rash noted.       Assessment & Plan:    Allergic rhinitis, unspecified seasonality, unspecified trigger -     predniSONE (DELTASONE) 20 MG tablet; 2 tablets daily for 3 days, 1 tablet daily for 4 days. - Astelin nasal spray - no fever, chills, likely allergies, get on allergy pill, astelin, and prednisone, if not better 7 days will send in ABX - good hand washing  Future Appointments  Date Time Provider Department Center  10/14/2017  2:00 PM Lucky CowboyMcKeown, William, MD [facility] None

## 2017-05-29 NOTE — Patient Instructions (Addendum)
Try astelin nasal spray  Stay on allergy pill Please take the prednisone as directed below, this is NOT an antibiotic so you do NOT have to finish it. You can take it for a few days and stop it if you are doing better.   Please take the prednisone to help decrease inflammation and therefore decrease symptoms. Take it it with food to avoid GI upset. It can cause increased energy but on the other hand it can make it hard to sleep at night so please take it AT NIGHT WITH DINNER, it takes 8-12 hours to start working so it will NOT affect your sleeping if you take it at night with your food!!  If you are diabetic it will increase your sugars so decrease carbs and monitor your sugars closely.     HOW TO TREAT VIRAL COUGH AND COLD SYMPTOMS:  -Symptoms usually last at least 1 week with the worst symptoms being around day 4.  - colds usually start with a sore throat and end with a cough, and the cough can take 2 weeks to get better.  -No antibiotics are needed for colds, flu, sore throats, cough, bronchitis UNLESS symptoms are longer than 7 days OR if you are getting better then get drastically worse.  -There are a lot of combination medications (Dayquil, Nyquil, Vicks 44, tyelnol cold and sinus, ETC). Please look at the ingredients on the back so that you are treating the correct symptoms and not doubling up on medications/ingredients.    Medicines you can use  Nasal congestion  - pseudoephedrine (Sudafed)- behind the counter, do not use if you have high blood pressure, medicine that have -D in them.  - phenylephrine (Sudafed PE) -Dextormethorphan + chlorpheniramine (Coridcidin HBP)- okay if you have high blood pressure -Oxymetazoline (Afrin) nasal spray- LIMIT to 3 days -Saline nasal spray -Neti pot (used distilled or bottled water)  Ear pain/congestion  -pseudoephedrine (sudafed) - Nasonex/flonase nasal spray  Fever  -Acetaminophen (Tyelnol) -Ibuprofen (Advil, motrin, aleve)  Sore  Throat  -Acetaminophen (Tyelnol) -Ibuprofen (Advil, motrin, aleve) -Drink a lot of water -Gargle with salt water - Rest your voice (don't talk) -Throat sprays -Cough drops  Body Aches  -Acetaminophen (Tyelnol) -Ibuprofen (Advil, motrin, aleve)  Headache  -Acetaminophen (Tyelnol) -Ibuprofen (Advil, motrin, aleve) - Exedrin, Exedrin Migraine  Allergy symptoms (cough, sneeze, runny nose, itchy eyes) -Claritin or loratadine cheapest but likely the weakest  -Zyrtec or certizine at night because it can make you sleepy -The strongest is allegra or fexafinadine  Cheapest at [employer], [employer], [employer]  Cough  -Dextromethorphan (Delsym)- medicine that has DM in it -Guafenesin (Mucinex/Robitussin) - cough drops - drink lots of water  Chest Congestion  -Guafenesin (Mucinex/Robitussin)  Red Itchy Eyes  - Naphcon-A  Upset Stomach  - Bland diet (nothing spicy, greasy, fried, and high acid foods like tomatoes, oranges, berries) -OKAY- cereal, bread, soup, crackers, rice -Eat smaller more frequent meals -reduce caffeine, no alcohol -Loperamide (Imodium-AD) if diarrhea -Prevacid for heart burn  General health when sick  -Hydration -wash your hands frequently -keep surfaces clean -change pillow cases and sheets often -Get fresh air but do not exercise strenuously -Vitamin D, double up on it - Vitamin C -Zinc

## 2017-06-18 ENCOUNTER — Encounter: Payer: Self-pay | Admitting: Adult Health

## 2017-06-18 ENCOUNTER — Ambulatory Visit: Payer: BC Managed Care – PPO | Admitting: Adult Health

## 2017-06-18 VITALS — BP 104/80 | HR 96 | Temp 99.3°F | Ht 72.0 in | Wt 189.0 lb

## 2017-06-18 DIAGNOSIS — J00 Acute nasopharyngitis [common cold]: Secondary | ICD-10-CM | POA: Diagnosis not present

## 2017-06-18 LAB — POCT INFLUENZA A/B
INFLUENZA B, POC: NEGATIVE
Influenza A, POC: NEGATIVE

## 2017-06-18 MED ORDER — PREDNISONE 20 MG PO TABS: ORAL_TABLET | ORAL | 0 refills | Status: DC

## 2017-06-18 MED ORDER — AZITHROMYCIN 250 MG PO TABS: ORAL_TABLET | ORAL | 1 refills | Status: AC

## 2017-06-18 MED ORDER — AZELASTINE HCL 0.1 % NA SOLN: 2.0000 | Freq: Two times a day (BID) | NASAL | 2 refills | Status: DC

## 2017-06-18 NOTE — Progress Notes (Signed)
Assessment and Plan:  [facility] was seen today for uri.  Diagnoses and all orders for this visit:  Nasopharyngitis Will treat with abx due to ?complication from similar episode 3 weeks ago Flu negative- Suggested symptomatic OTC remedies. Nasal saline spray for congestion. Nasal steroids, allergy pill, oral steroids Follow up as needed, present to ER for dyspnea or fever above 101 F not controlled by tylenol/ibuprofen -     azithromycin (ZITHROMAX) 250 MG tablet; Take 2 tablets (500 mg) on  Day 1,  followed by 1 tablet (250 mg) once daily on Days 2 through 5. -     predniSONE (DELTASONE) 20 MG tablet; 2 tablets daily for 3 days, 1 tablet daily for 4 days.   Further disposition pending results of labs. Discussed med's effects and SE's.   Over 15 minutes of exam, counseling, chart review, and critical decision making was performed.   Future Appointments  Date Time Provider Department Center  10/14/2017  2:00 PM Lucky CowboyMcKeown, William, MD [facility] None    ------------------------------------------------------------------------------------------------------------------   HPI BP 104/80   Pulse 96   Temp 99.3 F (37.4 C)   Ht 6' (1.829 m)   Wt 189 lb (85.7 kg)   SpO2 95%   BMI 25.63 kg/m    67 y.o.male presents for 24 hours of sore throat, watery eyes, headache, nasal congestion/sinus pressure. He denies sense of fever/chills at home, but he has woken up "wet" at night. He denies myalgias/arthalgias, but endorses fatigue and states "I feel like I was hit by a bus."  He has been taking sudafed with some relief, has been gargling with listerine.   He was seen for a allergic rhinitis 3 weeks ago; was prescribed prednisone, astalin nasal spray and was feeling much better.   Flu swab negative today.   Past Medical History:  Diagnosis Date  . Arthritis   . Cataract   . Hearing aid worn      Allergies  Allergen Reactions  . Codeine     Current Outpatient Medications on File Prior  to Visit  Medication Sig  . aspirin 81 MG tablet Take 81 mg by mouth daily.  . Cholecalciferol (VITAMIN D PO) Take 10,000 Units by mouth daily.   . Flaxseed, Linseed, (FLAXSEED OIL PO) Take by mouth. Takes 1 daily  . Multiple Vitamins-Minerals (MULTIVITAMIN ADULT PO) Take 1 tablet by mouth daily.  . pantoprazole (PROTONIX) 20 MG tablet Take 1 tablet (20 mg total) daily by mouth.  . predniSONE (DELTASONE) 20 MG tablet 2 tablets daily for 3 days, 1 tablet daily for 4 days. (Patient not taking: Reported on 06/18/2017)   Current Facility-Administered Medications on File Prior to Visit  Medication  . testosterone cypionate (DEPOTESTOSTERONE CYPIONATE) injection 200 mg    ROS: Review of Systems  Constitutional: Positive for malaise/fatigue. Negative for chills, diaphoresis and fever.  HENT: Positive for congestion and sore throat. Negative for ear discharge, ear pain, hearing loss, sinus pain and tinnitus.   Eyes: Negative for blurred vision, pain, discharge and redness.  Respiratory: Negative for cough, hemoptysis, sputum production, shortness of breath, wheezing and stridor.   Cardiovascular: Negative for chest pain, palpitations and orthopnea.  Gastrointestinal: Negative for abdominal pain, diarrhea, nausea and vomiting.  Genitourinary: Negative.   Musculoskeletal: Negative for joint pain and myalgias.  Skin: Negative for rash.  Neurological: Positive for headaches. Negative for dizziness, sensory change and weakness.  Endo/Heme/Allergies: Negative for environmental allergies.  Psychiatric/Behavioral: Negative.   All other systems reviewed and are negative.  Physical Exam:  BP 104/80   Pulse 96   Temp 99.3 F (37.4 C)   Ht 6' (1.829 m)   Wt 189 lb (85.7 kg)   SpO2 95%   BMI 25.63 kg/m   General Appearance: Well nourished, appears to be feeling unwell, in no acute distress. Eyes: PERRLA, EOMs, conjunctiva no swelling or erythema Sinuses: No Frontal/maxillary  tenderness ENT/Mouth: Ext aud canals clear, TMs without erythema, bulging. No erythema, swelling, or exudate on post pharynx.  Tonsils not swollen or erythematous. Hearing normal.  Neck: Supple.  Respiratory: Respiratory effort normal, BS equal bilaterally without rales, rhonchi, wheezing or stridor.  Cardio: RRR with no MRGs. Brisk peripheral pulses without edema.  Abdomen: Soft, + BS.  Non tender, no guarding, rebound, hernias, masses. Lymphatics: Non tender without lymphadenopathy.  Musculoskeletal: Full ROM, 5/5 strength, normal gait.  Skin: Warm, dry without rashes, lesions, ecchymosis.  Neuro: Cranial nerves intact. Normal muscle tone, no cerebellar symptoms. Sensation intact.  Psych: Awake and oriented X 3, normal affect, Insight and Judgment appropriate.     Perry Maker, NP 2:16 PM [facility]

## 2017-06-18 NOTE — Patient Instructions (Signed)
Prednisone - zpak  Flonase, mucinex   Saline nasal irrigation    HOW TO TREAT VIRAL COUGH AND COLD SYMPTOMS:  -Symptoms usually last at least 1 week with the worst symptoms being around day 4.  - colds usually start with a sore throat and end with a cough, and the cough can take 2 weeks to get better.  -No antibiotics are needed for colds, flu, sore throats, cough, bronchitis UNLESS symptoms are longer than 7 days OR if you are getting better then get drastically worse.  -There are a lot of combination medications (Dayquil, Nyquil, Vicks 44, tyelnol cold and sinus, ETC). Please look at the ingredients on the back so that you are treating the correct symptoms and not doubling up on medications/ingredients.    Medicines you can use  Nasal congestion  Little Remedies saline spray (aerosol/mist)- can try this, it is in the kids section - pseudoephedrine (Sudafed)- behind the counter, do not use if you have high blood pressure, medicine that have -D in them.  - phenylephrine (Sudafed PE) -Dextormethorphan + chlorpheniramine (Coridcidin HBP)- okay if you have high blood pressure -Oxymetazoline (Afrin) nasal spray- LIMIT to 3 days -Saline nasal spray -Neti pot (used distilled or bottled water)  Ear pain/congestion  -pseudoephedrine (sudafed) - Nasonex/flonase nasal spray  Fever  -Acetaminophen (Tyelnol) -Ibuprofen (Advil, motrin, aleve)  Sore Throat  -Acetaminophen (Tyelnol) -Ibuprofen (Advil, motrin, aleve) -Drink a lot of water -Gargle with salt water - Rest your voice (don't talk) -Throat sprays -Cough drops  Body Aches  -Acetaminophen (Tyelnol) -Ibuprofen (Advil, motrin, aleve)  Headache  -Acetaminophen (Tyelnol) -Ibuprofen (Advil, motrin, aleve) - Exedrin, Exedrin Migraine  Allergy symptoms (cough, sneeze, runny nose, itchy eyes) -Claritin or loratadine cheapest but likely the weakest  -Zyrtec or certizine at night because it can make you sleepy -The strongest is  allegra or fexafinadine  Cheapest at [employer], [employer], [employer]  Cough  -Dextromethorphan (Delsym)- medicine that has DM in it -Guafenesin (Mucinex/Robitussin) - cough drops - drink lots of water  Chest Congestion  -Guafenesin (Mucinex/Robitussin)  Red Itchy Eyes  - Naphcon-A  Upset Stomach  - Bland diet (nothing spicy, greasy, fried, and high acid foods like tomatoes, oranges, berries) -OKAY- cereal, bread, soup, crackers, rice -Eat smaller more frequent meals -reduce caffeine, no alcohol -Loperamide (Imodium-AD) if diarrhea -Prevacid for heart burn  General health when sick  -Hydration -wash your hands frequently -keep surfaces clean -change pillow cases and sheets often -Get fresh air but do not exercise strenuously -Vitamin D, double up on it - Vitamin C -Zinc

## 2017-06-25 ENCOUNTER — Other Ambulatory Visit: Payer: Self-pay

## 2017-09-05 ENCOUNTER — Other Ambulatory Visit: Payer: Self-pay | Admitting: Physician Assistant

## 2017-09-05 DIAGNOSIS — R0789 Other chest pain: Secondary | ICD-10-CM

## 2017-09-05 DIAGNOSIS — R0600 Dyspnea, unspecified: Secondary | ICD-10-CM

## 2017-09-10 ENCOUNTER — Encounter (HOSPITAL_COMMUNITY): Payer: Self-pay | Admitting: Emergency Medicine

## 2017-09-10 ENCOUNTER — Emergency Department (HOSPITAL_COMMUNITY)
Admission: EM | Admit: 2017-09-10 | Discharge: 2017-09-10 | Disposition: A | Discharge status: Home / Self Care | Payer: Worker's Compensation | Attending: Emergency Medicine | Admitting: Emergency Medicine

## 2017-09-10 ENCOUNTER — Emergency Department (HOSPITAL_COMMUNITY): Payer: Worker's Compensation

## 2017-09-10 DIAGNOSIS — M898X1 Other specified disorders of bone, shoulder: Secondary | ICD-10-CM

## 2017-09-10 DIAGNOSIS — I10 Essential (primary) hypertension: Secondary | ICD-10-CM | POA: Insufficient documentation

## 2017-09-10 DIAGNOSIS — Z79899 Other long term (current) drug therapy: Secondary | ICD-10-CM | POA: Insufficient documentation

## 2017-09-10 DIAGNOSIS — S20411A Abrasion of right back wall of thorax, initial encounter: Secondary | ICD-10-CM

## 2017-09-10 DIAGNOSIS — S0990XA Unspecified injury of head, initial encounter: Secondary | ICD-10-CM | POA: Diagnosis present

## 2017-09-10 DIAGNOSIS — Z87891 Personal history of nicotine dependence: Secondary | ICD-10-CM | POA: Diagnosis not present

## 2017-09-10 DIAGNOSIS — S0101XA Laceration without foreign body of scalp, initial encounter: Secondary | ICD-10-CM | POA: Diagnosis not present

## 2017-09-10 DIAGNOSIS — Z7982 Long term (current) use of aspirin: Secondary | ICD-10-CM | POA: Diagnosis not present

## 2017-09-10 DIAGNOSIS — S50311A Abrasion of right elbow, initial encounter: Secondary | ICD-10-CM | POA: Insufficient documentation

## 2017-09-10 DIAGNOSIS — Y939 Activity, unspecified: Secondary | ICD-10-CM | POA: Insufficient documentation

## 2017-09-10 DIAGNOSIS — Y990 Civilian activity done for income or pay: Secondary | ICD-10-CM | POA: Diagnosis not present

## 2017-09-10 DIAGNOSIS — Y929 Unspecified place or not applicable: Secondary | ICD-10-CM | POA: Diagnosis not present

## 2017-09-10 DIAGNOSIS — W208XXA Other cause of strike by thrown, projected or falling object, initial encounter: Secondary | ICD-10-CM | POA: Diagnosis not present

## 2017-09-10 MED ORDER — ACETAMINOPHEN 325 MG PO TABS: 650.0000 mg | ORAL_TABLET | Freq: Four times a day (QID) | ORAL | 0 refills | Status: DC | PRN

## 2017-09-10 MED ORDER — LIDOCAINE-EPINEPHRINE (PF) 2 %-1:200000 IJ SOLN
10.0000 mL | Freq: Once | INTRAMUSCULAR | Status: AC
  Administered 2017-09-10: 10 mL via INTRADERMAL
  Filled 2017-09-10: qty 20

## 2017-09-10 NOTE — ED Provider Notes (Signed)
[facility] Provider Note   CSN: [id number] Arrival date & time: 09/10/17  1209     History   Chief Complaint Chief Complaint  Patient presents with  . Head Laceration    HPI Perry Evans is a 67 y.o. male.  HPI   Patient is a 67 year old male who presents the ED today to be evaluated for a head injury and head laceration that occurred just prior to arrival he was at work.  Patient states that he was kneeling on his knees working underneath a very large lawnmower when one of the blades of the lawnmower which weighed approximately 150 pounds fell off and hit him in the right elbow and then hit him on the superior aspect of his head on the right side.  Denies any loss of consciousness.  States he has had a headache 3/10 which is constant in nature.  States he did not fall onto the ground after that however he was trapped underneath the blade and it took two men to get the blade off of him.  States the blade also hit the right posterior aspect of his shoulder and scapula and he is having pain to the area.  Denies any numbness or weakness to his arms or legs.  Denies any other injuries from this accident.  No vision changes, lightheadedness, dizziness.  He is not on blood thinners.  No chest pain or shortness of breath.  No abdominal pain, nausea, vomiting, diarrhea.  No neck or back pain.  No amnesia to the event. Tdap UTD.   Past Medical History:  Diagnosis Date  . Arthritis   . Cataract   . Hearing aid worn     Patient Active Problem List   Diagnosis Date Noted  . Atypical chest pain 01/24/2017  . Medication management 07/19/2014  . Essential hypertension 04/28/2013  . Mixed hyperlipidemia 04/28/2013  . Prediabetes 04/28/2013  . Vitamin D deficiency 04/28/2013    Past Surgical History:  Procedure Laterality Date  . EYE SURGERY    . PANCREAS SURGERY          Home Medications    Prior to Admission medications   Medication Sig  Start Date End Date Taking? Authorizing Provider  acetaminophen (TYLENOL) 325 MG tablet Take 2 tablets (650 mg total) by mouth every 6 (six) hours as needed. Do not take more than  of tylenol per day 09/10/17   Kajal Scalici S, PA-C  aspirin 81 MG tablet Take 81 mg by mouth daily.    [provider]  azelastine (ASTELIN) 0.1 % nasal spray Place 2 sprays into both nostrils 2 (two) times daily. Use in each nostril as directed 06/18/17   Judd Gaudier, NP  Cholecalciferol (VITAMIN D PO) Take 10,000 Units by mouth daily.     [provider]  Flaxseed, Linseed, (FLAXSEED OIL PO) Take by mouth. Takes 1 daily    [provider]  Multiple Vitamins-Minerals (MULTIVITAMIN ADULT PO) Take 1 tablet by mouth daily.    [provider]  pantoprazole (PROTONIX) 20 MG tablet TAKE 1 TABLET BY MOUTH ONCE DAILY 09/05/17   Lucky Cowboy, MD  predniSONE (DELTASONE) 20 MG tablet 2 tablets daily for 3 days, 1 tablet daily for 4 days. 06/18/17   Judd Gaudier, NP    Family History Family History  Problem Relation Age of Onset  . Heart disease Father 71       killed  . Heart disease Paternal Aunt 23  . Heart  disease Paternal Uncle        3 uncles all died 44-70  . Heart disease Paternal Grandfather 91       MI died    Social History Social History   Tobacco Use  . Smoking status: Former Smoker    Last attempt to quit: 05/13/1998    Years since quitting: 19.3  . Smokeless tobacco: Never Used  Substance Use Topics  . Alcohol use: No  . Drug use: No     Allergies   Codeine   Review of Systems Review of Systems  Constitutional: Negative for fever.  HENT: Negative for sinus pain.   Eyes: Negative for visual disturbance.  Respiratory: Negative for shortness of breath.   Cardiovascular: Negative for chest pain.  Gastrointestinal: Negative for abdominal pain, constipation, diarrhea, nausea and vomiting.  Genitourinary: Negative for flank pain.    Musculoskeletal: Negative for back pain and neck pain.  Skin: Positive for wound.  Neurological: Positive for headaches. Negative for dizziness, weakness, light-headedness and numbness.       No loc, head injury     Physical Exam Updated Vital Signs BP 130/76   Pulse 76   Temp 99 F (37.2 C)   Resp 18   SpO2 99%   Physical Exam  Constitutional: He appears well-developed and well-nourished.  HENT:  Mouth/Throat: Oropharynx is clear and moist.  3cm linear laceration to right parietal scalp, no FB noted, no active bleeding  Eyes: Pupils are equal, round, and reactive to light. Conjunctivae and EOM are normal.  No nystagmus  Neck: Normal range of motion. Neck supple.  Cardiovascular: Normal rate and regular rhythm.  No murmur heard. Pulmonary/Chest: Effort normal and breath sounds normal. No respiratory distress.  No chest tenderness to the anterior chest and no tenderness along the posterior ribs.  Abdominal: Soft. Bowel sounds are normal. He exhibits no distension. There is no tenderness. There is no guarding.  No CVA tenderness bilaterally.  Musculoskeletal: He exhibits no edema.  No TTP to the cervical, thoracic, or lumbar spine. No pain to the paraspinous muscles.  Tenderness along the right scapula diffusely with no obvious step-off.  Overlying abrasions and erythema are present with no active bleeding.  No lacerations noted.  No ecchymosis.  Neurological: He is alert.  Mental Status:  Alert, thought content appropriate, able to give a coherent history. Speech fluent without evidence of aphasia. Able to follow 2 step commands without difficulty.  Cranial Nerves:  II:  pupils equal, round, reactive to light III,IV, VI: ptosis not present, extra-ocular motions intact bilaterally  V,VII: smile symmetric, facial light touch sensation equal VIII: hearing grossly normal to voice  X: uvula elevates symmetrically  XI: bilateral shoulder shrug symmetric and strong XII: midline  tongue extension without fassiculations Motor:  Normal tone. 5/5 strength of BUE and BLE major muscle groups including strong and equal grip strength and dorsiflexion/plantar flexion Sensory: light touch normal in all extremities. Cerebellar: normal finger-to-nose with bilateral upper extremities Gait: normal gait and balance CV: 2+ radial and DP/PT pulses  Skin: Skin is warm and dry. Capillary refill takes less than 2 seconds.  Psychiatric: He has a normal mood and affect.  Nursing note and vitals reviewed.    ED Treatments / Results  Labs (all labs ordered are listed, but only abnormal results are displayed) Labs Reviewed - No data to display  EKG None  Radiology Dg Scapula Right  Result Date: 09/10/2017 CLINICAL DATA:  Right scapular injury after lawnmower accident. EXAM:  RIGHT SCAPULA - 2+ VIEWS COMPARISON:  None. FINDINGS: There is no evidence of fracture or other focal bone lesions. Soft tissues are unremarkable. IMPRESSION: Normal right scapula. Electronically Signed   By: Lupita Raider, M.D.   On: 09/10/2017 13:42   Ct Head Wo Contrast  Result Date: 09/10/2017 CLINICAL DATA:  Head trauma, struck on head and back by a falling commercial mower, laceration, headache, history smoking EXAM: CT HEAD WITHOUT CONTRAST TECHNIQUE: Contiguous axial images were obtained from the base of the skull through the vertex without intravenous contrast. Sagittal and coronal MPR images reconstructed from axial data set. COMPARISON:  None FINDINGS: Brain: Mild generalized atrophy. Normal ventricular morphology. No midline shift or mass effect. Otherwise normal appearance of brain parenchyma. No intracranial hemorrhage, mass lesion or evidence of acute infarction. No extra-axial fluid collections. Vascular: No hyperdense vessels Skull: Calvaria intact Sinuses/Orbits: Clear Other: N/A IMPRESSION: No acute intracranial abnormalities. Electronically Signed   By: Ulyses Southward M.D.   On: 09/10/2017 14:05     Procedures .Marland KitchenLaceration Repair Date/Time: 09/10/2017 2:48 PM Performed by: Karrie Meres, PA-C Authorized by: Karrie Meres, PA-C   Consent:    Consent obtained:  Verbal   Consent given by:  Patient   Risks discussed:  Infection and pain   Alternatives discussed:  No treatment Anesthesia (see MAR for exact dosages):    Anesthesia method:  Local infiltration   Local anesthetic:  Lidocaine 2% WITH epi Laceration details:    Location:  Scalp   Scalp location:  R parietal   Length (cm):  3 Repair type:    Repair type:  Simple Pre-procedure details:    Preparation:  Patient was prepped and draped in usual sterile fashion and imaging obtained to evaluate for foreign bodies Exploration:    Hemostasis achieved with:  Epinephrine   Wound exploration: wound explored through full range of motion and entire depth of wound probed and visualized   Treatment:    Area cleansed with:  Shur-Clens and saline   Amount of cleaning:  Standard   Irrigation solution:  Sterile saline   Irrigation volume:  500   Irrigation method:  Pressure wash   Visualized foreign bodies/material removed: no   Skin repair:    Repair method:  Staples   Number of staples:  5 Approximation:    Approximation:  Close Post-procedure details:    Dressing:  Open (no dressing)   Patient tolerance of procedure:  Tolerated well, no immediate complications   (including critical care time)  Medications Ordered in ED Medications  lidocaine-EPINEPHrine (XYLOCAINE W/EPI) 2 %-1:200000 (PF) injection 10 mL (has no administration in time range)     Initial Impression / Assessment and Plan / ED Course  I have reviewed the triage vital signs and the nursing notes.  Pertinent labs & imaging results that were available during my care of the patient were reviewed by me and considered in my medical decision making (see chart for details).   Final Clinical Impressions(s) / ED Diagnoses   Final diagnoses:  Injury  of head, initial encounter  Laceration of scalp without foreign body, initial encounter  Abrasion of right elbow, initial encounter  Abrasion of right side of back, initial encounter  Pain of right scapula   Presenting after head injury with laceration.  Head CT negative for any acute abnormality or skull fracture.  No intra-cranial hemorrhage noted.  X-ray of right scapula negative for fracture.  Normal neurologic exam.  Strength normal to bilateral upper  extremities sensation is also intact.  Laceration noted to right parietal scalp.  Pressure irrigation performed. Wound explored and base of wound visualized in a bloodless field without evidence of foreign body.  Laceration occurred < 8 hours prior to repair which was well tolerated.  Tdap is up-to-date.  Pt has no comorbidities to effect normal wound healing. Pt discharged without antibiotics.  Discussed suture home care with patient and answered questions. Pt to follow-up for wound check and suture removal in 7 days; they are to return to the ED sooner for signs of infection. Pt is hemodynamically stable with no complaints prior to dc.  All questions were answered and patient is a for discharge.   ED Discharge Orders        Ordered    acetaminophen (TYLENOL) 325 MG tablet  Every 6 hours PRN     09/10/17 1451       Crestina Strike S, PA-C 09/10/17 1451    Little, Ambrose Finland, MD 09/10/17 1551

## 2017-09-10 NOTE — ED Notes (Signed)
Patient transported to X-ray 

## 2017-09-10 NOTE — Discharge Instructions (Addendum)
Please return for suture removal in 7 to 10 days.  You may return to either your primary doctor, urgent care, or the emergency department to have the sutures removed. Please follow up with your primary care provider within 5-7 days for re-evaluation of your symptoms. If you do not have a primary care provider, information for a healthcare clinic has been provided for you to make arrangements for follow up care.    If any of the following occur notify your physician or go to the Hospital Emergency Department:  Increased drowsiness, stupor or loss of consciousness  Restlessness or convulsions (fits)  Paralysis in arms or legs  Temperature above 100 F  Vomiting  Severe headache  Blood or clear fluid dripping from the nose or ears  Stiffness of the neck  Dizziness or blurred vision  Pulsating pain in the eye  Unequal pupils of eye  Personality changes  Any other unusual symptoms PRECAUTIONS  Do not take tranquilizers, sedatives, narcotics or alcohol  Avoid aspirin. Use only acetaminophen (e.g. Tylenol) or ibuprofen (e.g. Advil) for relief of pain. Follow directions on the bottle for dosage.  Use ice packs for comfort  Getting plenty of rest and sleep helps the brain to heal. Do not try to do too much too fast. As you start to feel better, you can slowly and gradually return to your usual routine.  Avoid activities that are physically demanding (e.g., sports, heavy housecleaning, exercising) or require a lot of thinking or concentration (e.g., working on the computer, playing video games).  Ask your health care professional when you can safely drive a car, ride a bike, or operate heavy equipment.

## 2017-09-10 NOTE — ED Triage Notes (Addendum)
Pt states he was under a mower and a piece fell onto his right head. Denies LOC. Not on blood thinners. Laceration to right forehead noted. No active bleeding. Last tetanus <5 years. Pt does need work Occupational hygienist.

## 2017-09-17 ENCOUNTER — Other Ambulatory Visit: Payer: Self-pay

## 2017-09-17 ENCOUNTER — Emergency Department (HOSPITAL_COMMUNITY)
Admission: EM | Admit: 2017-09-17 | Discharge: 2017-09-18 | Disposition: A | Discharge status: Home / Self Care | Payer: Worker's Compensation | Attending: Emergency Medicine | Admitting: Emergency Medicine

## 2017-09-17 ENCOUNTER — Encounter (HOSPITAL_COMMUNITY): Payer: Self-pay

## 2017-09-17 DIAGNOSIS — Z4802 Encounter for removal of sutures: Secondary | ICD-10-CM

## 2017-09-17 DIAGNOSIS — Z7982 Long term (current) use of aspirin: Secondary | ICD-10-CM | POA: Diagnosis not present

## 2017-09-17 DIAGNOSIS — Z87891 Personal history of nicotine dependence: Secondary | ICD-10-CM | POA: Diagnosis not present

## 2017-09-17 DIAGNOSIS — I10 Essential (primary) hypertension: Secondary | ICD-10-CM | POA: Diagnosis not present

## 2017-09-17 DIAGNOSIS — S0101XD Laceration without foreign body of scalp, subsequent encounter: Secondary | ICD-10-CM | POA: Insufficient documentation

## 2017-09-17 DIAGNOSIS — X58XXXA Exposure to other specified factors, initial encounter: Secondary | ICD-10-CM | POA: Insufficient documentation

## 2017-09-17 NOTE — ED Provider Notes (Signed)
[name] EMERGENCY DEPARTMENT Provider Note   CSN: [id number] Arrival date & time: 09/17/17  0755     History   Chief Complaint Chief Complaint  Patient presents with  . Suture / Staple Removal    HPI Perry Evans is a 67 y.o. male resents for evaluation of staple removal.  Patient reports he suffered a head laceration approximately 1 week ago and had staples placed.  Patient reports that he was told to follow-up in 1 week to get the staples taken out.  He states he has not noticed any surrounding warmth, redness.  He has not noticed any purulent drainage from site.  Patient denies any fevers.  The history is provided by the patient.    Past Medical History:  Diagnosis Date  . Arthritis   . Cataract   . Hearing aid worn     Patient Active Problem List   Diagnosis Date Noted  . Atypical chest pain 01/24/2017  . Medication management 07/19/2014  . Essential hypertension 04/28/2013  . Mixed hyperlipidemia 04/28/2013  . Prediabetes 04/28/2013  . Vitamin D deficiency 04/28/2013    Past Surgical History:  Procedure Laterality Date  . EYE SURGERY    . PANCREAS SURGERY          Home Medications    Prior to Admission medications   Medication Sig Start Date End Date Taking? Authorizing Provider  acetaminophen (TYLENOL) 325 MG tablet Take 2 tablets (650 mg total) by mouth every 6 (six) hours as needed. Do not take more than  of tylenol per day 09/10/17   Couture, Cortni S, PA-C  aspirin 81 MG tablet Take 81 mg by mouth daily.    [provider]  azelastine (ASTELIN) 0.1 % nasal spray Place 2 sprays into both nostrils 2 (two) times daily. Use in each nostril as directed 06/18/17   Judd Gaudier, NP  Cholecalciferol (VITAMIN D PO) Take 10,000 Units by mouth daily.     [provider]  Flaxseed, Linseed, (FLAXSEED OIL PO) Take by mouth. Takes 1 daily    [provider]  Multiple Vitamins-Minerals (MULTIVITAMIN ADULT PO)  Take 1 tablet by mouth daily.    [provider]  pantoprazole (PROTONIX) 20 MG tablet TAKE 1 TABLET BY MOUTH ONCE DAILY 09/05/17   Lucky Cowboy, MD  predniSONE (DELTASONE) 20 MG tablet 2 tablets daily for 3 days, 1 tablet daily for 4 days. 06/18/17   Judd Gaudier, NP    Family History Family History  Problem Relation Age of Onset  . Heart disease Father 51       killed  . Heart disease Paternal Aunt 47  . Heart disease Paternal Uncle        3 uncles all died 68-70  . Heart disease Paternal Grandfather 88       MI died    Social History Social History   Tobacco Use  . Smoking status: Former Smoker    Last attempt to quit: 05/13/1998    Years since quitting: 19.3  . Smokeless tobacco: Never Used  Substance Use Topics  . Alcohol use: No  . Drug use: No     Allergies   Codeine   Review of Systems Review of Systems  Constitutional: Negative for fever.  Skin: Positive for wound. Negative for color change.     Physical Exam Updated Vital Signs BP 124/77   Pulse (!) 59   Temp 98.2 F (36.8 C) (Oral)   Resp 18   SpO2  100%   Physical Exam  Constitutional: He appears well-developed and well-nourished.  HENT:  Head: Normocephalic and atraumatic.  Eyes: Conjunctivae and EOM are normal. Right eye exhibits no discharge. Left eye exhibits no discharge. No scleral icterus.  Pulmonary/Chest: Effort normal.  Neurological: He is alert.  Skin: Skin is warm and dry.  3 cm well-healed laceration scar with staples in place.  No surrounding warmth, erythema.  No purulent drainage noted.  Psychiatric: He has a normal mood and affect. His speech is normal and behavior is normal.  Nursing note and vitals reviewed.    ED Treatments / Results  Labs (all labs ordered are listed, but only abnormal results are displayed) Labs Reviewed - No data to display  EKG None  Radiology No results found.  Procedures .Suture Removal Date/Time: 09/17/2017 8:26 AM Performed  by: Maxwell Caul, PA-C Authorized by: Maxwell Caul, PA-C   Consent:    Consent obtained:  Verbal   Consent given by:  Patient   Risks discussed:  Bleeding, pain and wound separation Location:    Location:  Head/neck   Head/neck location:  Scalp Procedure details:    Wound appearance:  No signs of infection and good wound healing   Number of staples removed:  5 Post-procedure details:    Patient tolerance of procedure:  Tolerated well, no immediate complications   (including critical care time)  Medications Ordered in ED Medications - No data to display   Initial Impression / Assessment and Plan / ED Course  I have reviewed the triage vital signs and the nursing notes.  Pertinent labs & imaging results that were available during my care of the patient were reviewed by me and considered in my medical decision making (see chart for details).     67 year old male who presents for evaluation of staple removal.  Had a left head laceration that occurred approximate 1 week ago. Told  To follow-up in 1 week to get the sutures removed.  Reports no surrounding warmth, erythema.  No purulent drainage noted. Patient is afebrile, non-toxic appearing, sitting comfortably on examination table. Vital signs reviewed and stable.  On exam, patient has a 3 cm linear laceration that is well-healed with staples in place.  No surrounding warmth, erythema.  No purulent drainage noted.  Staples removed as documented above.  Patient tolerated procedure well.  Wound care precautions discussed with patient. Patient had ample opportunity for questions and discussion. All patient's questions were answered with full understanding. Strict return precautions discussed. Patient expresses understanding and agreement to plan.   Final Clinical Impressions(s) / ED Diagnoses   Final diagnoses:  Encounter for staple removal    ED Discharge Orders    None       Rosana Hoes 09/17/17 0827      Raeford Razor, MD 09/17/17 [phone]

## 2017-09-17 NOTE — ED Triage Notes (Signed)
Patient here to have staples removed from forehead, in place x 1 week

## 2017-09-17 NOTE — Discharge Instructions (Signed)
Keep the wound clean and dry.  Follow-up with your primary care doctor as needed.  Return to the ED for any worsening redness/swelling, fever, or any other concerns.

## 2017-10-13 NOTE — Patient Instructions (Signed)

## 2017-10-13 NOTE — Progress Notes (Signed)
[facility]   Perry Evans, M.D.     Perry Evans. Steffanie Dunn, P.A.-C Judd Gaudier, DNP [facility]                [address]                [place], [place]. [address] Telephone [phone] Telefax [phone] Annual  Screening/Preventative Visit  & Comprehensive Evaluation & Examination     This very nice [name] presents for a Screening/Preventative Visit & comprehensive evaluation and management of multiple medical co-morbidities.  Patient has been followed for HTN, HLD, Prediabetes and Vitamin D Deficiency. Patient's GERD is not controlled and desires to try increasing his meds.     Patient has been followed expectantly for Labile HTN since 2007. Patient's BP has been controlled at home.  Today's BP is at goal -  126/78. Patient denies any cardiac symptoms as chest pain, palpitations, shortness of breath, dizziness or ankle swelling.     Patient's hyperlipidemia is controlled with diet and medications. Patient denies myalgias or other medication SE's. Last lipids were at goal: Lab Results  Component Value Date   CHOL 169 09/16/2016   HDL 44 09/16/2016   LDLCALC 95 09/16/2016   TRIG 149 09/16/2016   CHOLHDL 3.8 09/16/2016      Patient has hx/o abnormal glucose and is followed expectantly for prediabetes and patient denies reactive hypoglycemic symptoms, visual blurring, diabetic polys or paresthesias. Last A1c was Normal and at goal: Lab Results  Component Value Date   HGBA1C 5.2 09/16/2016       Finally, patient has history of Vitamin D Deficiency ("32"/2010)  and last vitamin D was improved (goal 70-100): Lab Results  Component Value Date   VD25OH 56 09/16/2016   Current Outpatient Medications on File Prior to Visit  Medication Sig  . aspirin 81 MG tablet Take 81 mg by mouth daily.  Marland Kitchen azelastine (ASTELIN) 0.1 % nasal spray Place 2 sprays into both nostrils 2 (two) times daily. Use in each nostril as  directed  . Cholecalciferol (VITAMIN D PO) Take 10,000 Units by mouth daily.   . Flaxseed, Linseed, (FLAXSEED OIL PO) Take by mouth. Takes 1 daily  . Multiple Vitamins-Minerals (MULTIVITAMIN ADULT PO) Take 1 tablet by mouth daily.  . pantoprazole (PROTONIX) 20 MG tablet TAKE 1 TABLET BY MOUTH ONCE DAILY  . zinc gluconate 50 MG tablet Take 50 mg by mouth daily.   No current facility-administered medications on file prior to visit.    Allergies  Allergen Reactions  . Codeine    Past Medical History:  Diagnosis Date  . Arthritis   . Cataract   . Hearing aid worn    Health Maintenance  Topic Date Due  . COLONOSCOPY  06/07/2015  . PNA vac Low Risk Adult (1 of 2 - PCV13) 09/15/2015  . INFLUENZA VACCINE  12/11/2017  . TETANUS/TDAP  08/27/2018  . Hepatitis C Screening  Completed   Immunization History  Administered Date(s) Administered  . DT 05/13/1996  . Hepatitis A 05/13/2009  . PPD Test 06/03/2013, 07/19/2014, 09/16/2016  . Tdap 08/26/2008   Last Colon - 06/06/2005 - Dr Arlyce Dice and over due 10 yr recall. He desires to try Cologard 1st if his insurance will cover  Past Surgical History:  Procedure Laterality Date  . EYE SURGERY    . PANCREAS SURGERY     Family History  Problem Relation Age of Onset  . Heart  disease Father [id number]       killed  . Heart disease Paternal Aunt 4570  . Heart disease Paternal Uncle        3 uncles all died 6850-70  . Heart disease Paternal Grandfather 1650       MI died   Social History   Socioeconomic History  . Marital status: Married    Spouse name: Not on file  . Number of children: Not on file  . Years of education: Not on file  . Highest education level: Not on file  Occupational History  . 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 Smoking status: Former Smoker    Last attempt to quit: 05/13/1998    Years since quitting: 19.4  . Smokeless tobacco: Never Used  Substance and Sexual Activity  . Alcohol use:  No  . Drug use: No  . Sexual activity: Not on file    ROS Constitutional: Denies fever, chills, weight loss/gain, headaches, insomnia,  night sweats or change in appetite. Does c/o fatigue. Eyes: Denies redness, blurred vision, diplopia, discharge, itchy or watery eyes.  ENT: Denies discharge, congestion, post nasal drip, epistaxis, sore throat, earache, hearing loss, dental pain, Tinnitus, Vertigo, Sinus pain or snoring.  Cardio: Denies chest pain, palpitations, irregular heartbeat, syncope, dyspnea, diaphoresis, orthopnea, PND, claudication or edema Respiratory: denies cough, dyspnea, DOE, pleurisy, hoarseness, laryngitis or wheezing.  Gastrointestinal: Denies dysphagia, heartburn, reflux, water brash, pain, cramps, nausea, vomiting, bloating, diarrhea, constipation, hematemesis, melena, hematochezia, jaundice or hemorrhoids Genitourinary: Denies dysuria, frequency, urgency, nocturia, hesitancy, discharge, hematuria or flank pain Musculoskeletal: Denies arthralgia, myalgia, stiffness, Jt. Swelling, pain, limp or strain/sprain. Denies Falls. Skin: Denies puritis, rash, hives, warts, acne, eczema or change in skin lesion Neuro: No weakness, tremor, incoordination, spasms, paresthesia or pain Psychiatric: Denies confusion, memory loss or sensory loss. Denies Depression. Endocrine: Denies change in weight, skin, hair change, nocturia, and paresthesia, diabetic polys, visual blurring or hyper / hypo glycemic episodes.  Heme/Lymph: No excessive bleeding, bruising or enlarged lymph nodes.  Physical Exam  BP 126/78   Pulse 80   Temp (!) 97.4 F (36.3 C)   Resp 18   Ht 5' 10.5" (1.791 m)   Wt 199 lb 12.8 oz (90.6 kg)   BMI 28.26 kg/m   General Appearance: Well nourished and well groomed and in no apparent distress.  Eyes: PERRLA, EOMs, conjunctiva no swelling or erythema, normal fundi and vessels. Sinuses: No frontal/maxillary tenderness ENT/Mouth: EACs patent / TMs  nl. Nares clear  without erythema, swelling, mucoid exudates. Oral hygiene is good. No erythema, swelling, or exudate. Tongue normal, non-obstructing. Tonsils not swollen or erythematous. Hearing normal.  Neck: Supple, thyroid not palpable. No bruits, nodes or JVD. Respiratory: Respiratory effort normal.  BS equal and clear bilateral without rales, rhonci, wheezing or stridor. Cardio: Heart sounds are normal with regular rate and rhythm and no murmurs, rubs or gallops. Peripheral pulses are normal and equal bilaterally without edema. No aortic or femoral bruits. Chest: symmetric with normal excursions and percussion.  Abdomen: Soft, with Nl bowel sounds. Nontender, no guarding, rebound, hernias, masses, or organomegaly.  Lymphatics: Non tender without lymphadenopathy.  Genitourinary: No hernias.Testes nl. DRE - prostate nl for age - smooth & firm w/o nodules. Musculoskeletal: Full ROM all peripheral extremities, joint stability, 5/5 strength, and normal gait. Skin: Warm and dry without rashes, lesions, cyanosis, clubbing or  ecchymosis.  Neuro: Cranial nerves intact, reflexes equal bilaterally. Normal muscle tone, no cerebellar symptoms. Sensation  intact.  Pysch: Alert and oriented X 3 with normal affect, insight and judgment appropriate.   Assessment and Plan  1. Annual Preventative/Screening Exam   2. Essential hypertension  - EKG 12-Lead - [place], RETROPERITNL ABD,  LTD - Urinalysis, Routine w reflex microscopic - Microalbumin / creatinine urine ratio - CBC with Differential/Platelet - COMPLETE METABOLIC PANEL WITH GFR - Magnesium - TSH  3. Hyperlipidemia, mixed  - EKG 12-Lead - [place], RETROPERITNL ABD,  LTD - Lipid panel - TSH  4. Abnormal glucose  - EKG 12-Lead - [place], RETROPERITNL ABD,  LTD - Hemoglobin A1c - Insulin, random  5. Vitamin D deficiency  - VITAMIN D 25 Hydroxyl  6. Prediabetes  - Hemoglobin A1c - Insulin, random  7. Gastroesophageal reflux disease  - CBC with  Differential/Platelet  - pantoprazole (PROTONIX) 40 MG tablet; Take 1 tablet daily for Acid Indigestion & Reflux  Dispense: 90 tablet; Refill: 1 - ranitidine (ZANTAC) 300 MG tablet; Take 1 tablet daily for Acid Indigestion & Reflux  Dispense: 90 tablet; Refill: 1 - Anti-dyspeptic diet discussed  8. Testosterone deficiency  - Testosterone  9. Screening for colorectal cancer   10. Prostate cancer screening  - PSA  11. Screening for ischemic heart disease  - EKG 12-Lead - Lipid panel  12. FHx: heart disease  - EKG 12-Lead - [place], RETROPERITNL ABD,  LTD  13. Former smoker  - EKG 12-Lead - [place], RETROPERITNL ABD,  LTD  14. Screening for AAA (abdominal aortic aneurysm)  - [place], RETROPERITNL ABD,  LTD  15. Fatigue  - Iron,Total/Total Iron Binding Cap - Vitamin B12 - Testosterone - CBC with Differential/Platelet - TSH  16. Medication management  - Urinalysis, Routine w reflex microscopic - Microalbumin / creatinine urine ratio - CBC with Differential/Platelet - COMPLETE METABOLIC PANEL WITH GFR - Magnesium - Lipid panel - TSH - Hemoglobin A1c - Insulin, random - VITAMIN D 25 Hydroxyl           Patient was counseled in prudent diet, weight control to achieve/maintain BMI less than 25, BP monitoring, regular exercise and medications as discussed.  Discussed med effects and SE's. Routine screening labs and tests as requested with regular follow-up as recommended. Over 40 minutes of exam, counseling, chart review and high complex critical decision making was performed

## 2017-10-14 ENCOUNTER — Ambulatory Visit: Payer: BC Managed Care – PPO | Admitting: Internal Medicine

## 2017-10-14 ENCOUNTER — Encounter: Payer: Self-pay | Admitting: Internal Medicine

## 2017-10-14 VITALS — BP 126/78 | HR 80 | Temp 97.4°F | Resp 18 | Ht 70.5 in | Wt 199.8 lb

## 2017-10-14 DIAGNOSIS — Z1329 Encounter for screening for other suspected endocrine disorder: Secondary | ICD-10-CM

## 2017-10-14 DIAGNOSIS — Z131 Encounter for screening for diabetes mellitus: Secondary | ICD-10-CM | POA: Diagnosis not present

## 2017-10-14 DIAGNOSIS — N401 Enlarged prostate with lower urinary tract symptoms: Secondary | ICD-10-CM

## 2017-10-14 DIAGNOSIS — Z79899 Other long term (current) drug therapy: Secondary | ICD-10-CM | POA: Diagnosis not present

## 2017-10-14 DIAGNOSIS — E349 Endocrine disorder, unspecified: Secondary | ICD-10-CM

## 2017-10-14 DIAGNOSIS — Z13220 Encounter for screening for lipoid disorders: Secondary | ICD-10-CM | POA: Diagnosis not present

## 2017-10-14 DIAGNOSIS — R7309 Other abnormal glucose: Secondary | ICD-10-CM

## 2017-10-14 DIAGNOSIS — Z1389 Encounter for screening for other disorder: Secondary | ICD-10-CM | POA: Diagnosis not present

## 2017-10-14 DIAGNOSIS — Z130 Encounter for screening for diseases of the blood and blood-forming organs and certain disorders involving the immune mechanism: Secondary | ICD-10-CM

## 2017-10-14 DIAGNOSIS — Z0001 Encounter for general adult medical examination with abnormal findings: Secondary | ICD-10-CM

## 2017-10-14 DIAGNOSIS — Z87891 Personal history of nicotine dependence: Secondary | ICD-10-CM

## 2017-10-14 DIAGNOSIS — Z125 Encounter for screening for malignant neoplasm of prostate: Secondary | ICD-10-CM | POA: Diagnosis not present

## 2017-10-14 DIAGNOSIS — I10 Essential (primary) hypertension: Secondary | ICD-10-CM

## 2017-10-14 DIAGNOSIS — K219 Gastro-esophageal reflux disease without esophagitis: Secondary | ICD-10-CM

## 2017-10-14 DIAGNOSIS — Z1212 Encounter for screening for malignant neoplasm of rectum: Secondary | ICD-10-CM

## 2017-10-14 DIAGNOSIS — R7303 Prediabetes: Secondary | ICD-10-CM

## 2017-10-14 DIAGNOSIS — E559 Vitamin D deficiency, unspecified: Secondary | ICD-10-CM | POA: Diagnosis not present

## 2017-10-14 DIAGNOSIS — R5383 Other fatigue: Secondary | ICD-10-CM

## 2017-10-14 DIAGNOSIS — Z0000 Encounter for general adult medical examination without abnormal findings: Secondary | ICD-10-CM

## 2017-10-14 DIAGNOSIS — R350 Frequency of micturition: Secondary | ICD-10-CM | POA: Diagnosis not present

## 2017-10-14 DIAGNOSIS — Z136 Encounter for screening for cardiovascular disorders: Secondary | ICD-10-CM

## 2017-10-14 DIAGNOSIS — Z1211 Encounter for screening for malignant neoplasm of colon: Secondary | ICD-10-CM

## 2017-10-14 DIAGNOSIS — E782 Mixed hyperlipidemia: Secondary | ICD-10-CM

## 2017-10-14 DIAGNOSIS — Z8249 Family history of ischemic heart disease and other diseases of the circulatory system: Secondary | ICD-10-CM

## 2017-10-14 MED ORDER — RANITIDINE HCL 300 MG PO TABS: ORAL_TABLET | ORAL | 1 refills | Status: DC

## 2017-10-14 MED ORDER — PANTOPRAZOLE SODIUM 40 MG PO TBEC: DELAYED_RELEASE_TABLET | ORAL | 1 refills | Status: DC

## 2017-10-15 LAB — LIPID PANEL
Cholesterol: 147 mg/dL (ref <200)
HDL: 40 mg/dL — AB (ref >40)
LDL Cholesterol (Calc): 84 mg/dL (calc)
NON-HDL CHOLESTEROL (CALC): 107 mg/dL (ref <130)
TRIGLYCERIDES: 124 mg/dL (ref <150)
Total CHOL/HDL Ratio: 3.7 (calc) (ref <5.0)

## 2017-10-15 LAB — CBC WITH DIFFERENTIAL/PLATELET
BASOS ABS: 58 {cells}/uL (ref 0–200)
Basophils Relative: 0.8 %
EOS ABS: 209 {cells}/uL (ref 15–500)
Eosinophils Relative: 2.9 %
HEMATOCRIT: 44.7 % (ref 38.5–50.0)
Hemoglobin: 16 g/dL (ref 13.2–17.1)
Lymphs Abs: 2146 cells/uL (ref 850–3900)
MCH: 31.2 pg (ref 27.0–33.0)
MCHC: 35.8 g/dL (ref 32.0–36.0)
MCV: 87.1 fL (ref 80.0–100.0)
MPV: 10.8 fL (ref 7.5–12.5)
Monocytes Relative: 9.5 %
NEUTROS PCT: 57 %
Neutro Abs: 4104 cells/uL (ref 1500–7800)
Platelets: 226 10*3/uL (ref 140–400)
RBC: 5.13 10*6/uL (ref 4.20–5.80)
RDW: 12.7 % (ref 11.0–15.0)
Total Lymphocyte: 29.8 %
WBC: 7.2 10*3/uL (ref 3.8–10.8)
WBCMIX: 684 {cells}/uL (ref 200–950)

## 2017-10-15 LAB — URINALYSIS, ROUTINE W REFLEX MICROSCOPIC
Bilirubin Urine: NEGATIVE
Glucose, UA: NEGATIVE
Hgb urine dipstick: NEGATIVE
KETONES UR: NEGATIVE
LEUKOCYTES UA: NEGATIVE
NITRITE: NEGATIVE
Protein, ur: NEGATIVE
SPECIFIC GRAVITY, URINE: 1.021 (ref 1.001–1.03)
pH: <=5 (ref 5.0–8.0)

## 2017-10-15 LAB — COMPLETE METABOLIC PANEL WITH GFR
AG RATIO: 2.6 (calc) — AB (ref 1.0–2.5)
ALT: 25 U/L (ref 9–46)
AST: 24 U/L (ref 10–35)
Albumin: 4.7 g/dL (ref 3.6–5.1)
Alkaline phosphatase (APISO): 65 U/L (ref 40–115)
BUN: 18 mg/dL (ref 7–25)
CALCIUM: 10 mg/dL (ref 8.6–10.3)
CO2: 30 mmol/L (ref 20–32)
Chloride: 104 mmol/L (ref 98–110)
Creat: 1.05 mg/dL (ref 0.70–1.25)
GFR, EST AFRICAN AMERICAN: 85 mL/min/{1.73_m2} (ref >=60)
GFR, EST NON AFRICAN AMERICAN: 73 mL/min/{1.73_m2} (ref >=60)
Globulin: 1.8 g/dL (calc) — ABNORMAL LOW (ref 1.9–3.7)
Glucose, Bld: 113 mg/dL — ABNORMAL HIGH (ref 65–99)
Potassium: 4.1 mmol/L (ref 3.5–5.3)
Sodium: 141 mmol/L (ref 135–146)
TOTAL PROTEIN: 6.5 g/dL (ref 6.1–8.1)
Total Bilirubin: 0.6 mg/dL (ref 0.2–1.2)

## 2017-10-15 LAB — MICROALBUMIN / CREATININE URINE RATIO
CREATININE, URINE: 107 mg/dL (ref 20–320)
MICROALB UR: 0.4 mg/dL
MICROALB/CREAT RATIO: 4 ug/mg{creat} (ref <30)

## 2017-10-15 LAB — MAGNESIUM: Magnesium: 2.1 mg/dL (ref 1.5–2.5)

## 2017-10-15 LAB — TSH: TSH: 2.26 mIU/L (ref 0.40–4.50)

## 2017-10-15 LAB — INSULIN, RANDOM: Insulin: 10.4 u[IU]/mL (ref 2.0–19.6)

## 2017-10-15 LAB — PSA: PSA: 0.5 ng/mL (ref <=4.0)

## 2017-10-15 LAB — HEMOGLOBIN A1C
Hgb A1c MFr Bld: 5.1 % of total Hgb (ref <5.7)
Mean Plasma Glucose: 100 (calc)
eAG (mmol/L): 5.5 (calc)

## 2017-10-15 LAB — IRON, TOTAL/TOTAL IRON BINDING CAP
%SAT: 25 % (calc) (ref 15–60)
Iron: 65 ug/dL (ref 50–180)
TIBC: 265 ug/dL (ref 250–425)

## 2017-10-15 LAB — TESTOSTERONE: TESTOSTERONE: 201 ng/dL — AB (ref 250–827)

## 2017-10-15 LAB — VITAMIN D 25 HYDROXY (VIT D DEFICIENCY, FRACTURES): Vit D, 25-Hydroxy: 72 ng/mL (ref 30–100)

## 2017-10-15 LAB — VITAMIN B12: Vitamin B-12: 490 pg/mL (ref 200–1100)

## 2017-10-15 NOTE — Progress Notes (Signed)
-   PSA Very low - Great   - Testosterone low, so continue  Zinc 50 mg daily & can consider taking a Testosterone shot every 2 weeks - costs about $20-25 /month or rub on Testosterone Gel - costs about $400 /month  - Chol 147 and LDL 84 - Both Excellent - Very low risk for Heart Attack  / Stroke  - A1c Normal - Great - No Diabetes  - Vit D 72 - Excellent   - All Else - CBC - Kidneys - Electrolytes -  Liver - Magnesium & Thyroid  - all  Normal / OK

## 2017-12-05 ENCOUNTER — Encounter: Payer: Self-pay | Admitting: Adult Health

## 2017-12-05 ENCOUNTER — Ambulatory Visit: Payer: BC Managed Care – PPO | Admitting: Adult Health

## 2017-12-05 VITALS — BP 124/76 | HR 86 | Temp 98.1°F | Ht 70.5 in | Wt 196.0 lb

## 2017-12-05 DIAGNOSIS — H6123 Impacted cerumen, bilateral: Secondary | ICD-10-CM | POA: Diagnosis not present

## 2017-12-05 NOTE — Patient Instructions (Signed)
Use a dropper or use a cap to put peroxide, olive oil,mineral oil or canola oil in the effected ear- 2-3 times a week. Let it soak for 20-30 min then you can take a shower or use a baby bulb with warm water to wash out the ear wax. Continue to prevent recurrence.  Do not use Qtips   Earwax Buildup, Adult The ears produce a substance called earwax that helps keep bacteria out of the ear and protects the skin in the ear canal. Occasionally, earwax can build up in the ear and cause discomfort or hearing loss. What increases the risk? This condition is more likely to develop in people who:  Are male.  Are elderly.  Naturally produce more earwax.  Clean their ears often with cotton swabs.  Use earplugs often.  Use in-ear headphones often.  Wear hearing aids.  Have narrow ear canals.  Have earwax that is overly thick or sticky.  Have eczema.  Are dehydrated.  Have excess hair in the ear canal.  What are the signs or symptoms? Symptoms of this condition include:  Reduced or muffled hearing.  A feeling of fullness in the ear or feeling that the ear is plugged.  Fluid coming from the ear.  Ear pain.  Ear itch.  Ringing in the ear.  Coughing.  An obvious piece of earwax that can be seen inside the ear canal.  How is this diagnosed? This condition may be diagnosed based on:  Your symptoms.  Your medical history.  An ear exam. During the exam, your health care provider will look into your ear with an instrument called an otoscope.  You may have tests, including a hearing test. How is this treated? This condition may be treated by:  Using ear drops to soften the earwax.  Having the earwax removed by a health care provider. The health care provider may: ? Flush the ear with water. ? Use an instrument that has a loop on the end (curette). ? Use a suction device.  Surgery to remove the wax buildup. This may be done in severe cases.  Follow these instructions  at home:  Take over-the-counter and prescription medicines only as told by your health care provider.  Do not put any objects, including cotton swabs, into your ear. You can clean the opening of your ear canal with a washcloth or facial tissue.  Follow instructions from your health care provider about cleaning your ears. Do not over-clean your ears.  Drink enough fluid to keep your urine clear or pale yellow. This will help to thin the earwax.  Keep all follow-up visits as told by your health care provider. If earwax builds up in your ears often or if you use hearing aids, consider seeing your health care provider for routine, preventive ear cleanings. Ask your health care provider how often you should schedule your cleanings.  If you have hearing aids, clean them according to instructions from the manufacturer and your health care provider. Contact a health care provider if:  You have ear pain.  You develop a fever.  You have blood, pus, or other fluid coming from your ear.  You have hearing loss.  You have ringing in your ears that does not go away.  Your symptoms do not improve with treatment.  You feel like the room is spinning (vertigo). Summary  Earwax can build up in the ear and cause discomfort or hearing loss.  The most common symptoms of this condition include reduced or  muffled hearing and a feeling of fullness in the ear or feeling that the ear is plugged.  This condition may be diagnosed based on your symptoms, your medical history, and an ear exam.  This condition may be treated by using ear drops to soften the earwax or by having the earwax removed by a health care provider.  Do not put any objects, including cotton swabs, into your ear. You can clean the opening of your ear canal with a washcloth or facial tissue. This information is not intended to replace advice given to you by your health care provider. Make sure you discuss any questions you have with your  health care provider. Document Released: 06/06/2004 Document Revised: 07/10/2016 Document Reviewed: 07/10/2016 Elsevier Interactive Patient Education  [employer].

## 2017-12-05 NOTE — Progress Notes (Signed)
Bi-lateral ear lavage performed with use of Elephant and a curette. No complications.

## 2017-12-05 NOTE — Progress Notes (Signed)
Assessment and Plan:  Bilateral cerumen impaction with hearing loss - stop using Qtips, irrigation used in the office without complications, with immediate improvement in hearing   use OTC drops/oil at home to prevent reoccurrence Check ears at each office visit, irrigate if indicated, may schedule NV during the summer for regular ear cleaning if needed  Further disposition pending results of labs. Discussed med's effects and SE's.   Over 15 minutes of exam, counseling, chart review, and critical decision making was performed.   Future Appointments  Date Time Provider Department Center  01/26/2018  4:00 PM Quentin Mulling, PA-C [facility] None  04/28/2018  3:30 PM Lucky Cowboy, MD [facility] None  11/03/2018  2:00 PM Lucky Cowboy, MD [facility] None    ------------------------------------------------------------------------------------------------------------------   HPI BP 124/76   Pulse 86   Temp 98.1 F (36.7 C)   Ht 5' 10.5" (1.791 m)   Wt 196 lb (88.9 kg)   SpO2 98%   BMI 27.73 kg/m   67 y.o.male wears bilateral hearing aids, presents for sensation of bilateral ear fullness and decreased hearing x 2-3 weeks despite hearing aids. He reports this is a recurrent issue every summer about this time and typically resolves with manual irrigation of ears. He does attempt to clean ears using OTC products but has been unsuccessful. Denies headaches, dizziness, pain, ear discharge, cough, nausea, fever/chills.   Past Medical History:  Diagnosis Date  . Arthritis   . Cataract   . Hearing aid worn      Allergies  Allergen Reactions  . Codeine     Current Outpatient Medications on File Prior to Visit  Medication Sig  . aspirin 81 MG tablet Take 81 mg by mouth daily.  Marland Kitchen azelastine (ASTELIN) 0.1 % nasal spray Place 2 sprays into both nostrils 2 (two) times daily. Use in each nostril as directed (Patient taking differently: Place 2 sprays into both nostrils 2 (two) times  daily. Use in each nostril as needed)  . Cholecalciferol (VITAMIN D PO) Take 10,000 Units by mouth daily.   . Flaxseed, Linseed, (FLAXSEED OIL PO) Take by mouth. Takes 1 daily  . Multiple Vitamins-Minerals (MULTIVITAMIN ADULT PO) Take 1 tablet by mouth daily.  . pantoprazole (PROTONIX) 40 MG tablet Take 1 tablet daily for Acid Indigestion & Reflux (Patient taking differently: Take 1 tablet every other day for Acid Indigestion & Reflux)  . ranitidine (ZANTAC) 300 MG tablet Take 1 tablet daily for Acid Indigestion & Reflux (Patient taking differently: Take 1 tablet every other day, alternating with Protonix for Acid Indigestion & Reflux)  . zinc gluconate 50 MG tablet Take 50 mg by mouth daily.   No current facility-administered medications on file prior to visit.     ROS: all negative except above.   Physical Exam:  BP 124/76   Pulse 86   Temp 98.1 F (36.7 C)   Ht 5' 10.5" (1.791 m)   Wt 196 lb (88.9 kg)   SpO2 98%   BMI 27.73 kg/m   General Appearance: Well nourished, in no apparent distress. Eyes: PERRLA, EOMs, conjunctiva no swelling or erythema Sinuses: No Frontal/maxillary tenderness ENT/Mouth: Ext aud canals obstructed bilaterally by hard/dry wax. Auricles and tragus non-tender, no mastoid tenderness, injection or bogginess. No erythema, swelling, or exudate on post pharynx.  Tonsils not swollen or erythematous. Hearing decreased bilaterally.  Neck: Supple. Respiratory: Respiratory effort normal Cardio: RRR with no MRGs.  Lymphatics: Non tender without lymphadenopathy.  Musculoskeletal:  symmetrical strength, normal gait.  Skin: Warm,  dry without rashes, lesions, ecchymosis.  Neuro: Cranial nerves intact. Normal muscle tone, no cerebellar symptoms. Sensation intact.  Psych: Awake and oriented X 3, normal affect, Insight and Judgment appropriate.    Perry MakerAshley C Johanna Stafford, NP 11:50 AM [facility]

## 2018-01-22 NOTE — Progress Notes (Signed)
FOLLOW UP  Assessment and Plan:  Perry Evans was seen today for follow-up and other.  Diagnoses and all orders for this visit:  Essential hypertension -     CBC with Differential/Platelet -     COMPLETE METABOLIC PANEL WITH GFR -     TSH  Mixed hyperlipidemia -     Lipid panel  Vitamin D deficiency -     VITAMIN D 25 Hydroxy (Vit-D Deficiency, Fractures)  Medication management  Gastroesophageal reflux disease without esophagitis -     pantoprazole (PROTONIX) 20 MG tablet; Take 1 tablet (20 mg total) by mouth daily. Take 1 tablet daily for Acid Indigestion & Reflux  Testosterone deficiency -     Testosterone     Continue diet and meds as discussed. Further disposition pending results of labs. Over 30 minutes of exam, counseling, chart review, and critical decision making was performed  Future Appointments  Date Time Provider Department Center  04/28/2018  3:30 PM Lucky CowboyMcKeown, William, MD [facility] None  11/03/2018  2:00 PM Lucky CowboyMcKeown, William, MD [facility] None     HPI 67 y.o. right handed male  presents for 3 month follow up on hypertension, cholesterol, prediabetes, and vitamin D deficiency.   Right carpal tunnel, he is [employer] at work and uses his hands a lot, was wearing a night brace that was helping but no longer helping, some weakness in that hand and pain at night.   His blood pressure has been controlled at home, today their BP is BP: 120/66  He does not workout. He denies chest pain, shortness of breath, dizziness. Had normal stress test, , continues to have GERD, off protonix.   He  is not  on cholesterol medication and denies myalgias. His cholesterol is at goal. The cholesterol last visit was:   Lab Results  Component Value Date   CHOL 147 10/14/2017   HDL 40 (L) 10/14/2017   LDLCALC 84 10/14/2017   TRIG 124 10/14/2017   CHOLHDL 3.7 10/14/2017    Last A1C in the office was:  Lab Results  Component Value Date   HGBA1C 5.1 10/14/2017   Patient is on  Vitamin D supplement.   Lab Results  Component Value Date   VD25OH 72 10/14/2017     BMI is Body mass index is 28.57 kg/m., he is working on diet and exercise. Wt Readings from Last 3 Encounters:  01/26/18 202 lb (91.6 kg)  12/05/17 196 lb (88.9 kg)  10/14/17 199 lb 12.8 oz (90.6 kg)   He has a history of testosterone deficiency and is on testosterone replacement. He states that the testosterone helps with his energy, libido, muscle mass. Lab Results  Component Value Date   TESTOSTERONE 201 (L) 10/14/2017     Current Medications:  Current Outpatient Medications on File Prior to Visit  Medication Sig  . aspirin 81 MG tablet Take 81 mg by mouth daily.  . Cholecalciferol (VITAMIN D PO) Take 10,000 Units by mouth daily.   . Flaxseed, Linseed, (FLAXSEED OIL PO) Take by mouth. Takes 1 daily  . Multiple Vitamins-Minerals (MULTIVITAMIN ADULT PO) Take 1 tablet by mouth daily.  Marland Kitchen. zinc gluconate 50 MG tablet Take 50 mg by mouth daily.  Marland Kitchen. azelastine (ASTELIN) 0.1 % nasal spray Place 2 sprays into both nostrils 2 (two) times daily. Use in each nostril as directed (Patient not taking: Reported on 01/26/2018)   No current facility-administered medications on file prior to visit.     Medical History:  Past Medical History:  Diagnosis Date  . Arthritis   . Cataract   . Hearing aid worn    Allergies:  Allergies  Allergen Reactions  . Codeine      Review of Systems:  ROS  Family history- Review and unchanged Social history- Review and unchanged Physical Exam: BP 120/66   Pulse 78   Temp 98.3 F (36.8 C)   Resp 16   Ht 5' 10.5" (1.791 m)   Wt 202 lb (91.6 kg)   SpO2 98%   BMI 28.57 kg/m  Wt Readings from Last 3 Encounters:  01/26/18 202 lb (91.6 kg)  12/05/17 196 lb (88.9 kg)  10/14/17 199 lb 12.8 oz (90.6 kg)   General Appearance: Well nourished, in no apparent distress. Eyes: PERRLA, EOMs, conjunctiva no swelling or erythema Sinuses: No Frontal/maxillary  tenderness ENT/Mouth: Ext aud canals clear, TMs without erythema, bulging. No erythema, swelling, or exudate on post pharynx.  Tonsils not swollen or erythematous. Hearing normal.  Neck: Supple, thyroid normal.  Respiratory: Respiratory effort normal, BS equal bilaterally without rales, rhonchi, wheezing or stridor.  Cardio: RRR with no MRGs. Brisk peripheral pulses without edema.  Abdomen: Soft, + BS,  Non tender, no guarding, rebound, hernias, masses. Lymphatics: Non tender without lymphadenopathy.  Musculoskeletal: Full ROM, 5/5 strength, Normal gait Skin: Warm, dry without rashes, lesions, ecchymosis.  Neuro: Cranial nerves intact. Normal muscle tone, no cerebellar symptoms. Psych: Awake and oriented X 3, normal affect, Insight and Judgment appropriate.    Quentin Mulling, PA-C 4:07 PM [facility]

## 2018-01-26 ENCOUNTER — Ambulatory Visit (INDEPENDENT_AMBULATORY_CARE_PROVIDER_SITE_OTHER): Payer: BC Managed Care – PPO | Admitting: Physician Assistant

## 2018-01-26 ENCOUNTER — Encounter: Payer: Self-pay | Admitting: Physician Assistant

## 2018-01-26 VITALS — BP 120/66 | HR 78 | Temp 98.3°F | Resp 16 | Ht 70.5 in | Wt 202.0 lb

## 2018-01-26 DIAGNOSIS — E349 Endocrine disorder, unspecified: Secondary | ICD-10-CM

## 2018-01-26 DIAGNOSIS — E782 Mixed hyperlipidemia: Secondary | ICD-10-CM | POA: Diagnosis not present

## 2018-01-26 DIAGNOSIS — Z79899 Other long term (current) drug therapy: Secondary | ICD-10-CM | POA: Diagnosis not present

## 2018-01-26 DIAGNOSIS — I10 Essential (primary) hypertension: Secondary | ICD-10-CM

## 2018-01-26 DIAGNOSIS — E559 Vitamin D deficiency, unspecified: Secondary | ICD-10-CM | POA: Diagnosis not present

## 2018-01-26 DIAGNOSIS — K219 Gastro-esophageal reflux disease without esophagitis: Secondary | ICD-10-CM

## 2018-01-26 MED ORDER — PANTOPRAZOLE SODIUM 20 MG PO TBEC: 20.0000 mg | DELAYED_RELEASE_TABLET | Freq: Every day | ORAL | 1 refills | Status: DC

## 2018-01-26 NOTE — Patient Instructions (Signed)
Can do zinc 40-50 mg a day Can try shake that is whey protein, almond milk, avocado oil, and spinach and strawberries in the morning  9 Ways to Naturally Increase Testosterone Levels  1.   Lose Weight If you're overweight, shedding the excess pounds may increase your testosterone levels, according to research presented at the Endocrine Society's 2012 meeting. Overweight men are more likely to have low testosterone levels to begin with, so this is an important trick to increase your body's testosterone production when you need it most.  2.   High-Intensity Exercise like Peak Fitness  Short intense exercise has a proven positive effect on increasing testosterone levels and preventing its decline. That's unlike aerobics or prolonged moderate exercise, which have shown to have negative or no effect on testosterone levels. Having a whey protein meal after exercise can further enhance the satiety/testosterone-boosting impact (hunger hormones cause the opposite effect on your testosterone and libido). Here's a summary of what a typical high-intensity Peak Fitness routine might look like: " Warm up for three minutes  " Exercise as hard and fast as you can for 30 seconds. You should feel like you couldn't possibly go on another few seconds  " Recover at a slow to moderate pace for 90 seconds  " Repeat the high intensity exercise and recovery 7 more times .  3.   Consume Plenty of Zinc The mineral zinc is important for testosterone production, and supplementing your diet for as little as six weeks has been shown to cause a marked improvement in testosterone among men with low levels.1 Likewise, research has shown that restricting dietary sources of zinc leads to a significant decrease in testosterone, while zinc supplementation increases it2 -- and even protects men from exercised-induced reductions in testosterone levels.3 It's estimated that up to 77 percent of adults over the age of 60 may have lower than  recommended zinc intakes; even when dietary supplements were added in, an estimated 20-25 percent of older adults still had inadequate zinc intakes, according to a [employer] and Nutrition Examination Survey.4 Your diet is the best source of zinc; along with protein-rich foods like meats and fish, other good dietary sources of zinc include raw milk, raw cheese, beans, and yogurt or kefir made from raw milk. It can be difficult to obtain enough dietary zinc if you're a vegetarian, and also for meat-eaters as well, largely because of conventional farming methods that rely heavily on chemical fertilizers and pesticides. These chemicals deplete the soil of nutrients ... nutrients like zinc that must be absorbed by plants in order to be passed on to you. In many cases, you may further deplete the nutrients in your food by the way you prepare it. For most food, cooking it will drastically reduce its levels of nutrients like zinc ... particularly over-cooking, which many people do. If you decide to use a zinc supplement, stick to a dosage of less than 40 mg a day, as this is the recommended adult upper limit. Taking too much zinc can interfere with your body's ability to absorb other minerals, especially copper, and may cause nausea as a side effect.  4.   Strength Training In addition to Peak Fitness, strength training is also known to boost testosterone levels, provided you are doing so intensely enough. When strength training to boost testosterone, you'll want to increase the weight and lower your number of reps, and then focus on exercises that work a large number of muscles, such as dead lifts or  squats.  You can "turbo-charge" your weight training by going slower. By slowing down your movement, you're actually turning it into a high-intensity exercise. Super Slow movement allows your muscle, at the microscopic level, to access the maximum number of cross-bridges between the protein filaments that produce  movement in the muscle.   5.   Optimize Your Vitamin D Levels Vitamin D, a steroid hormone, is essential for the healthy development of the nucleus of the sperm cell, and helps maintain semen quality and sperm count. Vitamin D also increases levels of testosterone, which may boost libido. In one study, overweight men who were given vitamin D supplements had a significant increase in testosterone levels after one year.5   6.   Reduce Stress When you're under a lot of stress, your body releases high levels of the stress hormone cortisol. This hormone actually blocks the effects of testosterone,6 presumably because, from a biological standpoint, testosterone-associated behaviors (mating, competing, aggression) may have lowered your chances of survival in an emergency (hence, the "fight or flight" response is dominant, courtesy of cortisol).  7.   Limit or Eliminate Sugar from Your Diet Testosterone levels decrease after you eat sugar, which is likely because the sugar leads to a high insulin level, another factor leading to low testosterone.7 Based on USDA estimates, the average American consumes 12 teaspoons of sugar a day, which equates to about TWO TONS of sugar during a lifetime.  8.   Eat Healthy Fats By healthy, this means not only mon- and polyunsaturated fats, like that found in avocadoes and nuts, but also saturated, as these are essential for building testosterone. Research shows that a diet with less than 40 percent of energy as fat (and that mainly from animal sources, i.e. saturated) lead to a decrease in testosterone levels.8 My personal diet is about 60-70 percent healthy fat, and other experts agree that the ideal diet includes somewhere between 50-70 percent fat.  It's important to understand that your body requires saturated fats from animal and vegetable sources (such as meat, dairy, certain oils, and tropical plants like coconut) for optimal functioning, and if you neglect this  important food group in favor of sugar, grains and other starchy carbs, your health and weight are almost guaranteed to suffer. Examples of healthy fats you can eat more of to give your testosterone levels a boost include: Olives and Olive oil  Coconuts and coconut oil Butter made from raw grass-fed organic milk Raw nuts, such as, almonds or pecans Organic pastured egg yolks Avocados Grass-fed meats Palm oil Unheated organic nut oils   9.   Boost Your Intake of Branch Chain Amino Acids (BCAA) from Foods Like Whey Protein Research suggests that BCAAs result in higher testosterone levels, particularly when taken along with resistance training.9 While BCAAs are available in supplement form, you'll find the highest concentrations of BCAAs like leucine in dairy products - especially quality cheeses and whey protein. Even when getting leucine from your natural food supply, it's often wasted or used as a building block instead of an anabolic agent. So to create the correct anabolic environment, you need to boost leucine consumption way beyond mere maintenance levels. That said, keep in mind that using leucine as a free form amino acid can be highly counterproductive as when free form amino acids are artificially administrated, they rapidly enter your circulation while disrupting insulin function, and impairing your body's glycemic control. Food-based leucine is really the ideal form that can benefit your muscles without side effects.  Carpal Tunnel Syndrome Carpal tunnel syndrome is a condition that causes pain in your hand and arm. The carpal tunnel is a narrow area located on the palm side of your wrist. Repeated wrist motion or certain diseases may cause swelling within the tunnel. This swelling pinches the main nerve in the wrist (median nerve). What are the causes? This condition may be caused by:  Repeated wrist motions.  Wrist injuries.  Arthritis.  A cyst or tumor in the carpal  tunnel.  Fluid buildup during pregnancy.  Sometimes the cause of this condition is not known. What increases the risk? This condition is more likely to develop in:  People who have jobs that cause them to repeatedly move their wrists in the same motion, such as Health visitorbutchers and cashiers.  Women.  People with certain conditions, such as: ? Diabetes. ? Obesity. ? An underactive thyroid (hypothyroidism). ? Kidney failure.  What are the signs or symptoms? Symptoms of this condition include:  A tingling feeling in your fingers, especially in your thumb, index, and middle fingers.  Tingling or numbness in your hand.  An aching feeling in your entire arm, especially when your wrist and elbow are bent for long periods of time.  Wrist pain that goes up your arm to your shoulder.  Pain that goes down into your palm or fingers.  A weak feeling in your hands. You may have trouble grabbing and holding items.  Your symptoms may feel worse during the night. How is this diagnosed? This condition is diagnosed with a medical history and physical exam. You may also have tests, including:  An electromyogram (EMG). This test measures electrical signals sent by your nerves into the muscles.  X-rays.  How is this treated? Treatment for this condition includes:  Lifestyle changes. It is important to stop doing or modify the activity that caused your condition.  Physical or occupational therapy.  Medicines for pain and inflammation. This may include medicine that is injected into your wrist.  A wrist splint.  Surgery.  Follow these instructions at home: If you have a splint:  Wear it as told by your health care provider. Remove it only as told by your health care provider.  Loosen the splint if your fingers become numb and tingle, or if they turn cold and blue.  Keep the splint clean and dry. General instructions  Take over-the-counter and prescription medicines only as told by  your health care provider.  Rest your wrist from any activity that may be causing your pain. If your condition is work related, talk to your employer about changes that can be made, such as getting a wrist pad to use while typing.  If directed, apply ice to the painful area: ? Put ice in a plastic bag. ? Place a towel between your skin and the bag. ? Leave the ice on for 20 minutes, 2-3 times per day.  Keep all follow-up visits as told by your health care provider. This is important.  Do any exercises as told by your health care provider, physical therapist, or occupational therapist. Contact a health care provider if:  You have new symptoms.  Your pain is not controlled with medicines.  Your symptoms get worse. This information is not intended to replace advice given to you by your health care provider. Make sure you discuss any questions you have with your health care provider. Document Released: 04/26/2000 Document Revised: 09/07/2015 Document Reviewed: 09/14/2014 Elsevier Interactive Patient Education  [employer]2018 [employer]

## 2018-01-27 LAB — COMPLETE METABOLIC PANEL WITH GFR
AG RATIO: 2.4 (calc) (ref 1.0–2.5)
ALKALINE PHOSPHATASE (APISO): 71 U/L (ref 40–115)
ALT: 23 U/L (ref 9–46)
AST: 21 U/L (ref 10–35)
Albumin: 4.7 g/dL (ref 3.6–5.1)
BILIRUBIN TOTAL: 0.5 mg/dL (ref 0.2–1.2)
BUN: 24 mg/dL (ref 7–25)
CALCIUM: 10.3 mg/dL (ref 8.6–10.3)
CO2: 29 mmol/L (ref 20–32)
Chloride: 106 mmol/L (ref 98–110)
Creat: 1.09 mg/dL (ref 0.70–1.25)
GFR, EST NON AFRICAN AMERICAN: 70 mL/min/{1.73_m2} (ref >=60)
GFR, Est African American: 81 mL/min/{1.73_m2} (ref >=60)
GLOBULIN: 2 g/dL (ref 1.9–3.7)
Glucose, Bld: 108 mg/dL — ABNORMAL HIGH (ref 65–99)
POTASSIUM: 4.4 mmol/L (ref 3.5–5.3)
SODIUM: 143 mmol/L (ref 135–146)
Total Protein: 6.7 g/dL (ref 6.1–8.1)

## 2018-01-27 LAB — CBC WITH DIFFERENTIAL/PLATELET
BASOS ABS: 59 {cells}/uL (ref 0–200)
Basophils Relative: 0.8 %
EOS ABS: 192 {cells}/uL (ref 15–500)
EOS PCT: 2.6 %
HCT: 46.6 % (ref 38.5–50.0)
HEMOGLOBIN: 16.1 g/dL (ref 13.2–17.1)
Lymphs Abs: 2597 cells/uL (ref 850–3900)
MCH: 30.4 pg (ref 27.0–33.0)
MCHC: 34.5 g/dL (ref 32.0–36.0)
MCV: 88.1 fL (ref 80.0–100.0)
MONOS PCT: 9.3 %
MPV: 11.2 fL (ref 7.5–12.5)
Neutro Abs: 3863 cells/uL (ref 1500–7800)
Neutrophils Relative %: 52.2 %
PLATELETS: 231 10*3/uL (ref 140–400)
RBC: 5.29 10*6/uL (ref 4.20–5.80)
RDW: 12.8 % (ref 11.0–15.0)
Total Lymphocyte: 35.1 %
WBC mixed population: 688 cells/uL (ref 200–950)
WBC: 7.4 10*3/uL (ref 3.8–10.8)

## 2018-01-27 LAB — VITAMIN D 25 HYDROXY (VIT D DEFICIENCY, FRACTURES): Vit D, 25-Hydroxy: 73 ng/mL (ref 30–100)

## 2018-01-27 LAB — LIPID PANEL
CHOL/HDL RATIO: 3.8 (calc) (ref <5.0)
CHOLESTEROL: 143 mg/dL (ref <200)
HDL: 38 mg/dL — ABNORMAL LOW (ref >40)
LDL Cholesterol (Calc): 74 mg/dL (calc)
Non-HDL Cholesterol (Calc): 105 mg/dL (calc) (ref <130)
Triglycerides: 217 mg/dL — ABNORMAL HIGH (ref <150)

## 2018-01-27 LAB — TESTOSTERONE: Testosterone: 227 ng/dL — ABNORMAL LOW (ref 250–827)

## 2018-01-27 LAB — TSH: TSH: 2.21 mIU/L (ref 0.40–4.50)

## 2018-02-18 ENCOUNTER — Encounter: Payer: Self-pay | Admitting: Adult Health

## 2018-02-18 ENCOUNTER — Other Ambulatory Visit: Payer: Self-pay | Admitting: Adult Health

## 2018-02-18 ENCOUNTER — Other Ambulatory Visit: Payer: Self-pay | Admitting: Internal Medicine

## 2018-02-18 ENCOUNTER — Ambulatory Visit (INDEPENDENT_AMBULATORY_CARE_PROVIDER_SITE_OTHER): Payer: BC Managed Care – PPO | Admitting: Adult Health

## 2018-02-18 VITALS — BP 116/74 | HR 86 | Temp 97.9°F | Ht 70.5 in | Wt 202.0 lb

## 2018-02-18 DIAGNOSIS — J00 Acute nasopharyngitis [common cold]: Secondary | ICD-10-CM

## 2018-02-18 MED ORDER — PROMETHAZINE-DM 6.25-15 MG/5ML PO SYRP: ORAL_SOLUTION | ORAL | 1 refills | Status: DC

## 2018-02-18 MED ORDER — PREDNISONE 20 MG PO TABS: ORAL_TABLET | ORAL | 0 refills | Status: DC

## 2018-02-18 MED ORDER — PROMETHAZINE-DM 6.25-15 MG/5ML PO SYRP: 5.0000 mL | ORAL_SOLUTION | Freq: Four times a day (QID) | ORAL | 1 refills | Status: DC | PRN

## 2018-02-18 NOTE — Patient Instructions (Signed)

## 2018-02-18 NOTE — Progress Notes (Signed)
Assessment and Plan:  Perry Evans was seen today for sore throat, sinusitis and cough.  Diagnoses and all orders for this visit:  Acute nasopharyngitis Discussed the importance of avoiding unnecessary antibiotic therapy. Suggested symptomatic OTC remedies. Nasal saline spray for congestion. Nasal steroids, allergy pill, oral steroids offered Follow up as needed, call if develop fever, progressive productive cough, or worsening symptoms -     predniSONE (DELTASONE) 20 MG tablet; 2 tablets daily for 3 days, 1 tablet daily for 4 days. -     promethazine-dextromethorphan (PROMETHAZINE-DM) 6.25-15 MG/5ML syrup; Take 5 mLs by mouth 4 (four) times daily as needed for cough.  Further disposition pending results of labs. Discussed med's effects and SE's.   Over 15 minutes of exam, counseling, chart review, and critical decision making was performed.   Future Appointments  Date Time Provider Department Center  04/28/2018  3:30 PM Lucky Cowboy, MD [facility] None  11/03/2018  2:00 PM Lucky Cowboy, MD [facility] None    ------------------------------------------------------------------------------------------------------------------   HPI BP 116/74   Pulse 86   Temp 97.9 F (36.6 C)   Ht 5' 10.5" (1.791 m)   Wt 202 lb (91.6 kg)   SpO2 96%   BMI 28.57 kg/m   67 y.o.male presents for evaluation of facial pressure, nasal congestion/running, watery eyes, sinus headache, sore throat that began yesterday, non-productive cough. Took sudafed trying to get better which helped some but quickly resumed. Denies fever/chills.   He reports hx of allergies, takes loratadine daily, has astalin and flonase but hasn't been taking   Denies dizziness, vision changes, weakness  He is leaving for vacation [place] beach with young grandchildren tomorrow and wants to feel better quickly. Admits he has similar symptoms at changes of season.   Past Medical History:  Diagnosis Date  . Arthritis   . Cataract    . Hearing aid worn      Allergies  Allergen Reactions  . Codeine     Current Outpatient Medications on File Prior to Visit  Medication Sig  . aspirin 81 MG tablet Take 81 mg by mouth daily.  Marland Kitchen azelastine (ASTELIN) 0.1 % nasal spray Place 2 sprays into both nostrils 2 (two) times daily. Use in each nostril as directed  . Cholecalciferol (VITAMIN D PO) Take 10,000 Units by mouth daily.   . Flaxseed, Linseed, (FLAXSEED OIL PO) Take by mouth. Takes 1 daily  . Multiple Vitamins-Minerals (MULTIVITAMIN ADULT PO) Take 1 tablet by mouth daily.  . pantoprazole (PROTONIX) 20 MG tablet Take 1 tablet (20 mg total) by mouth daily. Take 1 tablet daily for Acid Indigestion & Reflux (Patient taking differently: Take 20 mg by mouth daily. Take 1 tablet every other day for Acid Indigestion & Reflux)  . zinc gluconate 50 MG tablet Take 50 mg by mouth daily.   No current facility-administered medications on file prior to visit.     ROS: all negative except above.   Physical Exam:  BP 116/74   Pulse 86   Temp 97.9 F (36.6 C)   Ht 5' 10.5" (1.791 m)   Wt 202 lb (91.6 kg)   SpO2 96%   BMI 28.57 kg/m   General Appearance: Well nourished, in no apparent distress. Eyes: PERRLA, EOMs, conjunctiva mildly erythematous, no swelling or discharge Sinuses: No significant Frontal/maxillary tenderness ENT/Mouth: Ext aud canals clear, TMs without erythema, bulging. No erythema, swelling, or exudate on post pharynx.  Tonsils not swollen or erythematous. Hearing normal.  Neck: Supple.  Respiratory: Respiratory effort normal, BS  equal bilaterally without rales, rhonchi, wheezing or stridor.  Cardio: RRR with no MRGs. Brisk peripheral pulses without edema.  Abdomen: Soft, + BS.  Non tender. Lymphatics: Non tender without lymphadenopathy.  Musculoskeletal: normal gait.  Skin: Warm, dry without rashes, lesions, ecchymosis.  Neuro: Cranial nerves intact. Normal muscle tone Psych: Awake and oriented X 3, normal  affect, Insight and Judgment appropriate.     Dan Maker, NP 4:27 PM [facility]

## 2018-03-30 ENCOUNTER — Encounter: Payer: Self-pay | Admitting: Adult Health

## 2018-03-30 ENCOUNTER — Ambulatory Visit: Payer: BC Managed Care – PPO | Admitting: Adult Health

## 2018-03-30 VITALS — BP 128/82 | HR 60 | Temp 98.1°F | Ht 70.5 in | Wt 207.0 lb

## 2018-03-30 DIAGNOSIS — H6122 Impacted cerumen, left ear: Secondary | ICD-10-CM

## 2018-03-30 DIAGNOSIS — J0110 Acute frontal sinusitis, unspecified: Secondary | ICD-10-CM | POA: Diagnosis not present

## 2018-03-30 MED ORDER — AZITHROMYCIN 250 MG PO TABS: ORAL_TABLET | ORAL | 1 refills | Status: AC

## 2018-03-30 MED ORDER — PREDNISONE 20 MG PO TABS: ORAL_TABLET | ORAL | 0 refills | Status: DC

## 2018-03-30 NOTE — Progress Notes (Signed)
Assessment and Plan:  Reuel BoomDaniel was seen today for uri.  Diagnoses and all orders for this visit:  Acute non-recurrent frontal sinusitis Suggested symptomatic OTC remedies, add flonase regularly Nasal saline spray for congestion. Nasal steroids, allergy pill, oral steroids offered Follow up as needed. -     predniSONE (DELTASONE) 20 MG tablet; 2 tablets daily for 3 days, 1 tablet daily for 4 days. -     azithromycin (ZITHROMAX) 250 MG tablet; Take 2 tablets (500 mg) on  Day 1,  followed by 1 tablet (250 mg) once daily on Days 2 through 5.  Left ear impacted cerumen - stop using Qtips, irrigation used in the office without complications, use OTC drops/oil at home to prevent reoccurence  Further disposition pending results of labs. Discussed med's effects and SE's.   Over 15 minutes of exam, counseling, chart review, and critical decision making was performed.   Future Appointments  Date Time Provider Department Center  04/28/2018  3:30 PM Lucky CowboyMcKeown, William, MD [facility] None  11/03/2018  2:00 PM Lucky CowboyMcKeown, William, MD [facility] None    ------------------------------------------------------------------------------------------------------------------   HPI BP 128/82   Pulse 60   Temp 98.1 F (36.7 C)   Ht 5' 10.5" (1.791 m)   Wt 207 lb (93.9 kg)   SpO2 93%   BMI 29.28 kg/m   67 y.o.male former smoker, presents for evaluation of URI symptoms that began 4 days ago; began with sinus headache, watery eyes, runny nose, progressively getting worse. He also endorses sense of mild chills but no notable fever. He has been taking sudafed which helps for a few hours but gets stopped up quickly after that. He is coughing, but reports this well controlled by promethazine-DM. Denies dyspnea, chest pains,dizziness, vision changes. He does endorse bilateral ear pressure with some mild left ear pain.   He is prescribed azelastine but hasn't used. He has flonase but also hasn't used this due to  leaving at work over the weekend. He is on generic allergy medication, switched recently and feels this is working better than previous agent. Can't say what exactly he is taking.    He has had the flu vaccine.   Past Medical History:  Diagnosis Date  . Arthritis   . Cataract   . Hearing aid worn      Allergies  Allergen Reactions  . Codeine     Current Outpatient Medications on File Prior to Visit  Medication Sig  . aspirin 81 MG tablet Take 81 mg by mouth daily.  Marland Kitchen. azelastine (ASTELIN) 0.1 % nasal spray Place 2 sprays into both nostrils 2 (two) times daily. Use in each nostril as directed (Patient taking differently: Place 2 sprays into both nostrils as needed. Use in each nostril as directed)  . Cholecalciferol (VITAMIN D PO) Take 10,000 Units by mouth daily.   . Flaxseed, Linseed, (FLAXSEED OIL PO) Take by mouth. Takes 1 daily  . Multiple Vitamins-Minerals (MULTIVITAMIN ADULT PO) Take 1 tablet by mouth daily.  . pantoprazole (PROTONIX) 20 MG tablet Take 1 tablet (20 mg total) by mouth daily. Take 1 tablet daily for Acid Indigestion & Reflux (Patient taking differently: Take 20 mg by mouth daily. Take 1 tablet every other day for Acid Indigestion & Reflux)  . predniSONE (DELTASONE) 20 MG tablet 2 tablets daily for 3 days, 1 tablet daily for 4 days.  . promethazine-dextromethorphan (PROMETHAZINE-DM) 6.25-15 MG/5ML syrup Take 1 to 2 tsp enery 4 hours if needed for cough  . zinc gluconate 50 MG tablet  Take 50 mg by mouth daily.   No current facility-administered medications on file prior to visit.     ROS: all negative except above.   Physical Exam:  BP 128/82   Pulse 60   Temp 98.1 F (36.7 C)   Ht 5' 10.5" (1.791 m)   Wt 207 lb (93.9 kg)   SpO2 93%   BMI 29.28 kg/m   General Appearance: Well nourished, in no apparent distress. Eyes: PERRLA, EOMs, conjunctiva no swelling or erythema Sinuses: He has generalized frontal sinus tenderness, no maxillary tenderness, no  overlying erythema ENT/Mouth: R Ext aud canal clear, TM without erythema, bulging. L ext aud canal obstructed by hard wax. No erythema, swelling, or exudate on post pharynx.  Tonsils not visualized. Hearing normal.  Neck: Supple, thyroid normal.  Respiratory: Respiratory effort normal, BS equal bilaterally without rales, rhonchi, wheezing or stridor.  Cardio: RRR with no MRGs. Brisk peripheral pulses without edema.  Abdomen: Soft, + BS.  Non tender. Lymphatics: Non tender without lymphadenopathy.  Musculoskeletal: Symemtrical strength, normal gait.  Skin: Warm, dry without rashes, lesions, ecchymosis.  Neuro: Cranial nerves intact. Normal muscle tone, no cerebellar symptoms.  Psych: Awake and oriented X 3, normal affect, Insight and Judgment appropriate.     Dan Maker, NP 10:17 AM [facility]

## 2018-03-30 NOTE — Patient Instructions (Signed)

## 2018-04-28 ENCOUNTER — Ambulatory Visit: Payer: BC Managed Care – PPO | Admitting: Internal Medicine

## 2018-04-28 ENCOUNTER — Encounter: Payer: Self-pay | Admitting: Internal Medicine

## 2018-04-28 VITALS — BP 122/66 | HR 68 | Temp 97.5°F | Resp 16 | Ht 70.5 in | Wt 207.4 lb

## 2018-04-28 DIAGNOSIS — Z1212 Encounter for screening for malignant neoplasm of rectum: Secondary | ICD-10-CM

## 2018-04-28 DIAGNOSIS — R7303 Prediabetes: Secondary | ICD-10-CM

## 2018-04-28 DIAGNOSIS — R0989 Other specified symptoms and signs involving the circulatory and respiratory systems: Secondary | ICD-10-CM

## 2018-04-28 DIAGNOSIS — K219 Gastro-esophageal reflux disease without esophagitis: Secondary | ICD-10-CM

## 2018-04-28 DIAGNOSIS — Z79899 Other long term (current) drug therapy: Secondary | ICD-10-CM

## 2018-04-28 DIAGNOSIS — R7309 Other abnormal glucose: Secondary | ICD-10-CM | POA: Diagnosis not present

## 2018-04-28 DIAGNOSIS — E559 Vitamin D deficiency, unspecified: Secondary | ICD-10-CM | POA: Diagnosis not present

## 2018-04-28 DIAGNOSIS — E782 Mixed hyperlipidemia: Secondary | ICD-10-CM

## 2018-04-28 DIAGNOSIS — Z1211 Encounter for screening for malignant neoplasm of colon: Secondary | ICD-10-CM

## 2018-04-28 NOTE — Patient Instructions (Signed)

## 2018-04-28 NOTE — Progress Notes (Signed)
This very nice 67 y.o.  MWM presents for 6 month follow up with HTN, HLD, Pre-Diabetes and Vitamin D Deficiency.  Patient has hx/o GERD      Patient is followed expectantly for labile  HTN (2007) & BP has been controlled at home. Today's BP is at goal - 122/66. Patient has had no complaints of any cardiac type chest pain, palpitations, dyspnea / orthopnea / PND, dizziness, claudication, or dependent edema.     Hyperlipidemia is controlled with diet & meds. Patient denies myalgias or other med SE's. Last Lipids were at goal albeit elevated Trig's: Lab Results  Component Value Date   CHOL 143 01/26/2018   HDL 38 (L) 01/26/2018   LDLCALC 74 01/26/2018   TRIG 217 (H) 01/26/2018   CHOLHDL 3.8 01/26/2018      Also, the patient has history of abnormal glucose elevations and has had no symptoms of reactive hypoglycemia, diabetic polys, paresthesias or visual blurring.  Last A1c was Normal & at goal: Lab Results  Component Value Date   HGBA1C 5.1 10/14/2017      Further, the patient also has history of Vitamin D Deficiency ("32" / 2010) and supplements vitamin D without any suspected side-effects. Last vitamin D was at goal: Lab Results  Component Value Date   VD25OH 73 01/26/2018   Current Outpatient Medications on File Prior to Visit  Medication Sig  . aspirin 81 MG tablet Take  daily.  . ASTELIN 0.1 % nasal spray Place 2 sprays into nostrils 2  times daily.  Marland Kitchen. VITAMIN D Take 10,000 Units daily.   . Flaxseed oil cap Takes 1 daily  . Multiple Vitamins-Minerals Take 1 tablet  daily.  . pantoprazole  20 MG  Take 1 tablet daily  . zinc gluconate 50 MG tablet Take  daily.   Allergies  Allergen Reactions  . Codeine    PMHx:   Past Medical History:  Diagnosis Date  . Arthritis   . Cataract   . Hearing aid worn    Immunization History  Administered Date(s) Administered  . DT 05/13/1996  . Hepatitis A 05/13/2009  . Influenza-Unspecified 02/10/2018  . PPD Test 06/03/2013,  07/19/2014, 09/16/2016  . Tdap 08/26/2008   Past Surgical History:  Procedure Laterality Date  . EYE SURGERY    . PANCREAS SURGERY     FHx:    Reviewed / unchanged  SHx:    Reviewed / unchanged   Systems Review:  Constitutional: Denies fever, chills, wt changes, headaches, insomnia, fatigue, night sweats, change in appetite. Eyes: Denies redness, blurred vision, diplopia, discharge, itchy, watery eyes.  ENT: Denies discharge, congestion, post nasal drip, epistaxis, sore throat, earache, hearing loss, dental pain, tinnitus, vertigo, sinus pain, snoring.  CV: Denies chest pain, palpitations, irregular heartbeat, syncope, dyspnea, diaphoresis, orthopnea, PND, claudication or edema. Respiratory: denies cough, dyspnea, DOE, pleurisy, hoarseness, laryngitis, wheezing.  Gastrointestinal: Denies dysphagia, odynophagia, heartburn, reflux, water brash, abdominal pain or cramps, nausea, vomiting, bloating, diarrhea, constipation, hematemesis, melena, hematochezia  or hemorrhoids. Genitourinary: Denies dysuria, frequency, urgency, nocturia, hesitancy, discharge, hematuria or flank pain. Musculoskeletal: Denies arthralgias, myalgias, stiffness, jt. swelling, pain, limping or strain/sprain.  Skin: Denies pruritus, rash, hives, warts, acne, eczema or change in skin lesion(s). Neuro: No weakness, tremor, incoordination, spasms, paresthesia or pain. Psychiatric: Denies confusion, memory loss or sensory loss. Endo: Denies change in weight, skin or hair change.  Heme/Lymph: No excessive bleeding, bruising or enlarged lymph nodes.  Physical Exam  BP 122/66  Pulse 68   Temp (!) 97.5 F (36.4 C)   Resp 16   Ht 5' 10.5" (1.791 m)   Wt 207 lb 6.4 oz (94.1 kg)   BMI 29.34 kg/m   Appears  well nourished, well groomed  and in no distress.  Eyes: PERRLA, EOMs, conjunctiva no swelling or erythema. Sinuses: No frontal/maxillary tenderness ENT/Mouth: EAC's clear, TM's nl w/o erythema, bulging. Nares  clear w/o erythema, swelling, exudates. Oropharynx clear without erythema or exudates. Oral hygiene is good. Tongue normal, non obstructing. Hearing intact.  Neck: Supple. Thyroid not palpable. Car 2+/2+ without bruits, nodes or JVD. Chest: Respirations nl with BS clear & equal w/o rales, rhonchi, wheezing or stridor.  Cor: Heart sounds normal w/ regular rate and rhythm without sig. murmurs, gallops, clicks or rubs. Peripheral pulses normal and equal  without edema.  Abdomen: Soft & bowel sounds normal. Non-tender w/o guarding, rebound, hernias, masses or organomegaly.  Lymphatics: Unremarkable.  Musculoskeletal: Full ROM all peripheral extremities, joint stability, 5/5 strength and normal gait.  Skin: Warm, dry without exposed rashes, lesions or ecchymosis apparent.  Neuro: Cranial nerves intact, reflexes equal bilaterally. Sensory-motor testing grossly intact. Tendon reflexes grossly intact.  Pysch: Alert & oriented x 3.  Insight and judgement nl & appropriate. No ideations.  Assessment and Plan:  - Continue medication, monitor blood pressure at home.  - Continue DASH diet.  Reminder to go to the ER if any CP,  SOB, nausea, dizziness, severe HA, changes vision/speech.  - Continue diet/meds, exercise,& lifestyle modifications.  - Continue monitor periodic cholesterol/liver & renal functions   1. Labile hypertension  - CBC with Differential/Platelet - COMPLETE METABOLIC PANEL WITH GFR - Magnesium - TSH  2. Hyperlipidemia, mixed  - Lipid panel - TSH  3. Abnormal glucose  - Hemoglobin A1c - Insulin, random  4. Vitamin D deficiency  - VITAMIN D 25 Hydroxyl  5. Prediabetes  - Hemoglobin A1c - Insulin, random  6. Gastroesophageal reflux disease  - CBC with Differential/Platelet  7. Medication management  - CBC with Differential/Platelet - COMPLETE METABOLIC PANEL WITH GFR - Magnesium - Lipid panel - TSH - Hemoglobin A1c - Insulin, random - VITAMIN D 25  Hydroxyl   - Continue diet, exercise, lifestyle modifications.  - Monitor appropriate labs. - Continue supplementation.      Discussed  regular exercise, BP monitoring, weight control to achieve/maintain BMI less than 25 and discussed med and SE's. Recommended labs to assess and monitor clinical status with further disposition pending results of labs. Over 30 minutes of exam, counseling, chart review was performed.

## 2018-04-29 LAB — LIPID PANEL
Cholesterol: 152 mg/dL (ref <200)
HDL: 38 mg/dL — ABNORMAL LOW (ref >40)
LDL Cholesterol (Calc): 84 mg/dL (calc)
NON-HDL CHOLESTEROL (CALC): 114 mg/dL (ref <130)
TRIGLYCERIDES: 207 mg/dL — AB (ref <150)
Total CHOL/HDL Ratio: 4 (calc) (ref <5.0)

## 2018-04-29 LAB — COMPLETE METABOLIC PANEL WITH GFR
AG RATIO: 2.4 (calc) (ref 1.0–2.5)
ALBUMIN MSPROF: 4.5 g/dL (ref 3.6–5.1)
ALKALINE PHOSPHATASE (APISO): 71 U/L (ref 40–115)
ALT: 24 U/L (ref 9–46)
AST: 23 U/L (ref 10–35)
BILIRUBIN TOTAL: 0.5 mg/dL (ref 0.2–1.2)
BUN: 23 mg/dL (ref 7–25)
CHLORIDE: 104 mmol/L (ref 98–110)
CO2: 27 mmol/L (ref 20–32)
Calcium: 10.4 mg/dL — ABNORMAL HIGH (ref 8.6–10.3)
Creat: 1.15 mg/dL (ref 0.70–1.25)
GFR, EST AFRICAN AMERICAN: 76 mL/min/{1.73_m2} (ref >=60)
GFR, Est Non African American: 65 mL/min/{1.73_m2} (ref >=60)
GLUCOSE: 117 mg/dL — AB (ref 65–99)
Globulin: 1.9 g/dL (calc) (ref 1.9–3.7)
POTASSIUM: 4.1 mmol/L (ref 3.5–5.3)
Sodium: 139 mmol/L (ref 135–146)
Total Protein: 6.4 g/dL (ref 6.1–8.1)

## 2018-04-29 LAB — HEMOGLOBIN A1C
HEMOGLOBIN A1C: 5.1 %{Hb} (ref <5.7)
Mean Plasma Glucose: 100 (calc)
eAG (mmol/L): 5.5 (calc)

## 2018-04-29 LAB — CBC WITH DIFFERENTIAL/PLATELET
Absolute Monocytes: 686 cells/uL (ref 200–950)
BASOS ABS: 51 {cells}/uL (ref 0–200)
Basophils Relative: 0.7 %
EOS ABS: 248 {cells}/uL (ref 15–500)
EOS PCT: 3.4 %
HCT: 45.2 % (ref 38.5–50.0)
HEMOGLOBIN: 15.9 g/dL (ref 13.2–17.1)
Lymphs Abs: 2825 cells/uL (ref 850–3900)
MCH: 31.5 pg (ref 27.0–33.0)
MCHC: 35.2 g/dL (ref 32.0–36.0)
MCV: 89.5 fL (ref 80.0–100.0)
MONOS PCT: 9.4 %
MPV: 10.8 fL (ref 7.5–12.5)
NEUTROS ABS: 3489 {cells}/uL (ref 1500–7800)
NEUTROS PCT: 47.8 %
Platelets: 250 10*3/uL (ref 140–400)
RBC: 5.05 10*6/uL (ref 4.20–5.80)
RDW: 12.6 % (ref 11.0–15.0)
Total Lymphocyte: 38.7 %
WBC: 7.3 10*3/uL (ref 3.8–10.8)

## 2018-04-29 LAB — MAGNESIUM: MAGNESIUM: 2.1 mg/dL (ref 1.5–2.5)

## 2018-04-29 LAB — INSULIN, RANDOM: INSULIN: 9.9 u[IU]/mL (ref 2.0–19.6)

## 2018-04-29 LAB — TSH: TSH: 2.38 m[IU]/L (ref 0.40–4.50)

## 2018-04-29 LAB — VITAMIN D 25 HYDROXY (VIT D DEFICIENCY, FRACTURES): VIT D 25 HYDROXY: 62 ng/mL (ref 30–100)

## 2018-05-03 ENCOUNTER — Encounter: Payer: Self-pay | Admitting: Internal Medicine

## 2018-05-21 ENCOUNTER — Encounter: Payer: Self-pay | Admitting: Adult Health

## 2018-05-21 ENCOUNTER — Ambulatory Visit: Payer: BC Managed Care – PPO | Admitting: Adult Health

## 2018-05-21 VITALS — BP 102/64 | HR 92 | Temp 99.5°F | Ht 70.5 in | Wt 207.0 lb

## 2018-05-21 DIAGNOSIS — J00 Acute nasopharyngitis [common cold]: Secondary | ICD-10-CM

## 2018-05-21 MED ORDER — BENZONATATE 200 MG PO CAPS: ORAL_CAPSULE | ORAL | 1 refills | Status: DC

## 2018-05-21 MED ORDER — PREDNISONE 20 MG PO TABS: ORAL_TABLET | ORAL | 0 refills | Status: DC

## 2018-05-21 MED ORDER — AZITHROMYCIN 250 MG PO TABS: ORAL_TABLET | ORAL | 1 refills | Status: AC

## 2018-05-21 MED ORDER — MONTELUKAST SODIUM 10 MG PO TABS: 10.0000 mg | ORAL_TABLET | Freq: Every day | ORAL | 2 refills | Status: DC

## 2018-05-21 NOTE — Progress Notes (Signed)
Assessment and Plan:  Bb was seen today for acute visit.  Diagnoses and all orders for this visit:  Acute nasopharyngitis Discussed the importance of avoiding unnecessary antibiotic therapy- print and held antibiotic to initiate if not improving in another 2 days. Suggested symptomatic OTC remedies. Nasal saline spray for congestion. Nasal steroids, allergy pill, oral steroids offered Follow up as needed. -     predniSONE (DELTASONE) 20 MG tablet; 2 tablets daily for 3 days, 1 tablet daily for 4 days. -     benzonatate (TESSALON) 200 MG capsule; Take 1 tab up to 3 times a day as needed for cough. -     montelukast (SINGULAIR) 10 MG tablet; Take 1 tablet (10 mg total) by mouth at bedtime.  Further disposition pending results of labs. Discussed med's effects and SE's.   Over 15 minutes of exam, counseling, chart review, and critical decision making was performed.   Future Appointments  Date Time Provider Department Center  11/03/2018  2:00 PM Lucky Cowboy, MD [facility] None    ------------------------------------------------------------------------------------------------------------------   HPI BP 102/64   Pulse 92   Temp 99.5 F (37.5 C)   Ht 5' 10.5" (1.791 m)   Wt 207 lb (93.9 kg)   SpO2 96%   BMI 29.28 kg/m   68 y.o.male presents for evaluation of URI symptoms, began 5 days ago with a sore throat, started having sinus congestion, runny nose, watery/itchy eyes, can't breathe through his nose, non-productive cough. He reports progressively worsening, today is the worst, has mild fever, feels fatigued, starting to have body aches. Denies dyspnea, chest pain.   He endorses hx of seasonal allergies, has been on fexofenadine daily, has flonase but hasn't helped.   He has been using promethazine-DM   He has had the flu vaccine this season, his whole family is sick with similar symptoms  Past Medical History:  Diagnosis Date  . Arthritis   . Cataract   . Hearing  aid worn      Allergies  Allergen Reactions  . Codeine     Current Outpatient Medications on File Prior to Visit  Medication Sig  . aspirin 81 MG tablet Take 81 mg by mouth daily.  . Cholecalciferol (VITAMIN D PO) Take 10,000 Units by mouth daily.   . Flaxseed, Linseed, (FLAXSEED OIL PO) Take by mouth. Takes 1 daily  . Multiple Vitamins-Minerals (MULTIVITAMIN ADULT PO) Take 1 tablet by mouth daily.  . pantoprazole (PROTONIX) 20 MG tablet Take 1 tablet (20 mg total) by mouth daily. Take 1 tablet daily for Acid Indigestion & Reflux (Patient taking differently: Take 20 mg by mouth daily. Takes one tablet by mouth on M, W, F)  . zinc gluconate 50 MG tablet Take 50 mg by mouth daily.  Marland Kitchen azelastine (ASTELIN) 0.1 % nasal spray Place 2 sprays into both nostrils 2 (two) times daily. Use in each nostril as directed (Patient not taking: Reported on 05/21/2018)   No current facility-administered medications on file prior to visit.     ROS: all negative except above.   Physical Exam:  BP 102/64   Pulse 92   Temp 99.5 F (37.5 C)   Ht 5' 10.5" (1.791 m)   Wt 207 lb (93.9 kg)   SpO2 96%   BMI 29.28 kg/m   General Appearance: Well nourished, in no apparent distress. Eyes: PERRLA, EOMs, conjunctiva no swelling or erythema Sinuses: No Frontal/maxillary tenderness ENT/Mouth: Ext aud canals clear, TMs without erythema, bulging. No erythema, swelling, or exudate on  post pharynx.  Tonsils not swollen or erythematous. Hearing normal. Nasal congestion, nasal vocal quality Neck: Supple, thyroid normal.  Respiratory: Respiratory effort normal, BS equal bilaterally without rales, rhonchi, wheezing or stridor.  Cardio: RRR with no MRGs. Brisk peripheral pulses without edema.  Abdomen: Soft, + BS.  Non tender Lymphatics: Non tender without lymphadenopathy.  Musculoskeletal: normal gait.  Skin: Warm, dry without rashes, lesions, ecchymosis.  Neuro: Cranial nerves intact. Normal muscle tone Psych:  Awake and oriented X 3, normal affect, Insight and Judgment appropriate.     Dan MakerAshley C Mihran Lebarron, NP 5:11 PM [facility]

## 2018-05-21 NOTE — Patient Instructions (Addendum)
Continue allergy med, sudafed  Start prednisone  Call tomorrow for new cough med if not   Start zpak if not getting better in 2 days  HOW TO TREAT VIRAL COUGH AND COLD SYMPTOMS:  -Symptoms usually last at least 1 week with the worst symptoms being around day 4.  - colds usually start with a sore throat and end with a cough, and the cough can take 2 weeks to get better.  -No antibiotics are needed for colds, flu, sore throats, cough, bronchitis UNLESS symptoms are longer than 7 days OR if you are getting better then get drastically worse.  -There are a lot of combination medications (Dayquil, Nyquil, Vicks 44, tyelnol cold and sinus, ETC). Please look at the ingredients on the back so that you are treating the correct symptoms and not doubling up on medications/ingredients.    Medicines you can use  Nasal congestion  Little Remedies saline spray (aerosol/mist)- can try this, it is in the kids section - pseudoephedrine (Sudafed)- behind the counter, do not use if you have high blood pressure, medicine that have -D in them.  - phenylephrine (Sudafed PE) -Dextormethorphan + chlorpheniramine (Coridcidin HBP)- okay if you have high blood pressure -Oxymetazoline (Afrin) nasal spray- LIMIT to 3 days -Saline nasal spray -Neti pot (used distilled or bottled water)  Ear pain/congestion  -pseudoephedrine (sudafed) - Nasonex/flonase nasal spray  Fever  -Acetaminophen (Tyelnol) -Ibuprofen (Advil, motrin, aleve)  Sore Throat  -Acetaminophen (Tyelnol) -Ibuprofen (Advil, motrin, aleve) -Drink a lot of water -Gargle with salt water - Rest your voice (don't talk) -Throat sprays -Cough drops  Body Aches  -Acetaminophen (Tyelnol) -Ibuprofen (Advil, motrin, aleve)  Headache  -Acetaminophen (Tyelnol) -Ibuprofen (Advil, motrin, aleve) - Exedrin, Exedrin Migraine  Allergy symptoms (cough, sneeze, runny nose, itchy eyes) -Claritin or loratadine cheapest but likely the weakest  -Zyrtec or  certizine at night because it can make you sleepy -The strongest is allegra or fexafinadine  Cheapest at [employer], [employer], [employer]  Cough  -Dextromethorphan (Delsym)- medicine that has DM in it -Guafenesin (Mucinex/Robitussin) - cough drops - drink lots of water  Chest Congestion  -Guafenesin (Mucinex/Robitussin)  Red Itchy Eyes  - Naphcon-A  Upset Stomach  - Bland diet (nothing spicy, greasy, fried, and high acid foods like tomatoes, oranges, berries) -OKAY- cereal, bread, soup, crackers, rice -Eat smaller more frequent meals -reduce caffeine, no alcohol -Loperamide (Imodium-AD) if diarrhea -Prevacid for heart burn  General health when sick  -Hydration -wash your hands frequently -keep surfaces clean -change pillow cases and sheets often -Get fresh air but do not exercise strenuously -Vitamin D, double up on it - Vitamin C -Zinc

## 2018-06-10 ENCOUNTER — Other Ambulatory Visit: Payer: Self-pay | Admitting: Internal Medicine

## 2018-06-10 NOTE — Addendum Note (Signed)
Addended by: Lucky Cowboy on: 06/10/2018 09:23 PM   Modules accepted: Orders

## 2018-11-01 ENCOUNTER — Encounter: Payer: Self-pay | Admitting: Internal Medicine

## 2018-11-01 NOTE — Progress Notes (Signed)
[place] [facility]  Lucky CowboyWilliam Rochelle Nephew, M.D.        Dyanne CarrelAmanda R. Steffanie Dunnollier, P.A.-C         Judd GaudierAshley Corbett, DNP [facility]                [address]                [place], [place] [id number] Telephone [phone] Telefax [phone] Annual  Screening/Preventative Visit  & Comprehensive Evaluation & Examination     This very nice 68 y.o. MWM presents for a Screening /Preventative Visit & comprehensive evaluation and management of multiple medical co-morbidities.  Patient has been followed for HTN, HLD, Prediabetes and Vitamin D Deficiency. Patient has GERD controlled on low dose alternate day PPI.      Labile HTN predates since 2007. Patient's BP has been controlled at home.  Today's BP is at goal -  120/76. Patient denies any cardiac symptoms as chest pain, palpitations, shortness of breath, dizziness or ankle swelling.     Patient's hyperlipidemia is controlled with diet and medications. Patient denies myalgias or other medication SE's. Last lipids were at goal albeit elevated Trig's: Lab Results  Component Value Date   CHOL 152 04/28/2018   HDL 38 (L) 04/28/2018   LDLCALC 84 04/28/2018   TRIG 207 (H) 04/28/2018   CHOLHDL 4.0 04/28/2018      Patient has hx/o abnormal glucoses & is monitored expectantly for glucose intolerance since    and patient denies reactive hypoglycemic symptoms, visual blurring, diabetic polys or paresthesias. Last A1c was Normal & at goal: Lab Results  Component Value Date   HGBA1C 5.1 04/28/2018       Finally, patient has history of Vitamin D Deficiency ("32" / 2010) and last vitamin D was at goal: Lab Results  Component Value Date   VD25OH 62 04/28/2018   Current Outpatient Medications on File Prior to Visit  Medication Sig  . aspirin 81 MG tablet Take 81 mg by mouth daily.  . Cholecalciferol (VITAMIN D PO) Take 10,000 Units by mouth daily.   . Flaxseed, Linseed, (FLAXSEED OIL PO) Take by mouth.  Takes 1 daily  . Multiple Vitamins-Minerals (MULTIVITAMIN ADULT PO) Take 1 tablet by mouth daily.  Marland Kitchen. OVER THE COUNTER MEDICATION Uses OTC Flonase NS PRN  . pantoprazole (PROTONIX) 20 MG tablet Take 1 tablet (20 mg total) by mouth daily. Take 1 tablet daily for Acid Indigestion & Reflux (Patient taking differently: Take 20 mg by mouth daily. Takes one tablet by mouth on M, W, F)  . zinc gluconate 50 MG tablet Take 50 mg by mouth daily.   No current facility-administered medications on file prior to visit.    Allergies  Allergen Reactions  . Codeine    Past Medical History:  Diagnosis Date  . Arthritis   . Cataract   . Hearing aid worn    Health Maintenance  Topic Date Due  . COLONOSCOPY  06/07/2015  . PNA vac Low Risk Adult (1 of 2 - PCV13) 09/15/2015  . TETANUS/TDAP  08/27/2018  . INFLUENZA VACCINE  12/12/2018  . Hepatitis C Screening  Completed   Immunization History  Administered Date(s) Administered  . DT 05/13/1996  . Hepatitis A 05/13/2009  . Influenza-Unspecified 02/10/2018  . PPD Test 06/03/2013, 07/19/2014, 09/16/2016  . Tdap 08/26/2008   Last Colon - 06/06/2005 - Dr Arlyce DiceKaplan and over due 10 yr recall.  Past Surgical History:  Procedure Laterality Date  .  EYE SURGERY    . PANCREAS SURGERY     Family History  Problem Relation Age of Onset  . Heart disease Father [id number]       killed  . Heart disease Paternal Aunt [id number]  . Heart disease Paternal Uncle        3 uncles all died [id number]  . Heart disease Paternal Grandfather [id number]       MI died   Social History   Socioeconomic History  . Marital status: Married    Spouse name: Not on file  . Number of children: Not on file  Occupational History  . Employment: works in Teacher, English as a foreign languagemaintenance/mechanic for [employer]  Tobacco Use  . Smoking status: Former Smoker    Quit date: 05/13/1998    Years since quitting: 20.4  . Smokeless tobacco: Never Used  Substance and Sexual Activity  . Alcohol use: No  . Drug use: No   . Sexual activity: Active   ROS Constitutional: Denies fever, chills, weight loss/gain, headaches, insomnia,  night sweats or change in appetite. Does c/o fatigue. Eyes: Denies redness, blurred vision, diplopia, discharge, itchy or watery eyes.  ENT: Denies discharge, congestion, post nasal drip, epistaxis, sore throat, earache, hearing loss, dental pain, Tinnitus, Vertigo, Sinus pain or snoring.  Cardio: Denies chest pain, palpitations, irregular heartbeat, syncope, dyspnea, diaphoresis, orthopnea, PND, claudication or edema Respiratory: denies cough, dyspnea, DOE, pleurisy, hoarseness, laryngitis or wheezing.  Gastrointestinal: Denies dysphagia, heartburn, reflux, water brash, pain, cramps, nausea, vomiting, bloating, diarrhea, constipation, hematemesis, melena, hematochezia, jaundice or hemorrhoids Genitourinary: Denies dysuria, frequency, urgency, nocturia, hesitancy, discharge, hematuria or flank pain Musculoskeletal: Denies arthralgia, myalgia, stiffness, Jt. Swelling, pain, limp or strain/sprain. Denies Falls. Skin: Denies puritis, rash, hives, warts, acne, eczema or change in skin lesion Neuro: No weakness, tremor, incoordination, spasms, paresthesia or pain Psychiatric: Denies confusion, memory loss or sensory loss. Denies Depression. Endocrine: Denies change in weight, skin, hair change, nocturia, and paresthesia, diabetic polys, visual blurring or hyper / hypo glycemic episodes.  Heme/Lymph: No excessive bleeding, bruising or enlarged lymph nodes.  Physical Exam  BP 120/76   Pulse 80   Temp (!) 97.3 F (36.3 C)   Resp 16   Ht 5\' 11"  (1.803 m)   Wt 197 lb 9.6 oz (89.6 kg)   BMI 27.56 kg/m   General Appearance: Well nourished and well groomed and in no apparent distress.  Eyes: PERRLA, EOMs, conjunctiva no swelling or erythema, normal fundi and vessels. Sinuses: No frontal/maxillary tenderness ENT/Mouth: EACs patent / TMs  nl. Nares clear without erythema, swelling, mucoid  exudates. Oral hygiene is good. No erythema, swelling, or exudate. Tongue normal, non-obstructing. Tonsils not swollen or erythematous. Hearing normal.  Neck: Supple, thyroid not palpable. No bruits, nodes or JVD. Respiratory: Respiratory effort normal.  BS equal and clear bilateral without rales, rhonci, wheezing or stridor. Cardio: Heart sounds are normal with regular rate and rhythm and no murmurs, rubs or gallops. Peripheral pulses are normal and equal bilaterally without edema. No aortic or femoral bruits. Chest: symmetric with normal excursions and percussion.  Abdomen: Soft, with Nl bowel sounds. Nontender, no guarding, rebound, hernias, masses, or organomegaly.  Lymphatics: Non tender without lymphadenopathy.  Musculoskeletal: Full ROM all peripheral extremities, joint stability, 5/5 strength, and normal gait. Skin: Warm and dry without rashes, lesions, cyanosis, clubbing or  ecchymosis.  Neuro: Cranial nerves intact, reflexes equal bilaterally. Normal muscle tone, no cerebellar symptoms. Sensation intact.  Pysch: Alert and oriented x 3 with normal affect, insight and  judgment appropriate.   Assessment and Plan  1. Annual Preventative/Screening Exam   2. Labile hypertension  - EKG 12-Lead - [place], RETROPERITNL ABD,  LTD - COMPLETE METABOLIC PANEL WITH GFR - CBC with Differential/Platelet - Magnesium - TSH  3. Hyperlipidemia, mixed  - EKG 12-Lead - [place], RETROPERITNL ABD,  LTD - Lipid panel - TSH  4. Abnormal glucose  - EKG 12-Lead - [place], RETROPERITNL ABD,  LTD - Hemoglobin A1c - Insulin, random  5. Vitamin D deficiency  - VITAMIN D 25 Hydroxyl  6. Prediabetes  - EKG 12-Lead - [place], RETROPERITNL ABD,  LTD - CBC with Differential/Platelet - Hemoglobin A1c - Insulin, random  7. Gastroesophageal reflux disease   8. Screening for colorectal cancer  - POC Hemoccult Bld/Stl   9. BPH with obstruction/lower urinary tract symptoms  - PSA  10. Prostate cancer  screening  - PSA  11. Screening for ischemic heart disease  - EKG 12-Lead  12. FHx: heart disease  - EKG 12-Lead - [place], RETROPERITNL ABD,  LTD  13. Former smoker  - EKG 12-Lead - [place], RETROPERITNL ABD,  LTD  14. Screening for AAA (abdominal aortic aneurysm)  - [place], RETROPERITNL ABD,  LTD  15. Fatigue, unspecified type  - Vitamin B12 - Iron,Total/Total Iron Binding Cap - Testosterone - CBC with Differential/Platelet - TSH  16. Medication management  - Urinalysis, Routine w reflex microscopic - Microalbumin / creatinine urine ratio - COMPLETE METABOLIC PANEL WITH GFR - CBC with Differential/Platelet - Magnesium - Lipid panel - TSH - Hemoglobin A1c - Insulin, random - VITAMIN D 25 Hydroxyl  17. Need for prophylactic vaccination against Streptococcus pneumoniae (pneumococcus)  - Pneumococcal conjugate vaccine 13-valent  18. Need for prophylactic vaccination with tetanus-diphtheria (Td)  - Td : Tetanus/diphtheria >7yo Preservative  free     Patient was counseled in prudent diet, weight control to achieve/maintain BMI less than 25, BP monitoring, regular exercise and medications as discussed.  Discussed med effects and SE's. Routine screening labs and tests as requested with regular follow-up as recommended. Over 40 minutes of exam, counseling, chart review and high complex critical decision making was performed   Kirtland Bouchard, MD

## 2018-11-01 NOTE — Patient Instructions (Signed)

## 2018-11-02 ENCOUNTER — Other Ambulatory Visit: Payer: Self-pay

## 2018-11-02 ENCOUNTER — Ambulatory Visit: Payer: BC Managed Care – PPO | Admitting: Internal Medicine

## 2018-11-02 VITALS — BP 120/76 | HR 80 | Temp 97.3°F | Resp 16 | Ht 71.0 in | Wt 197.6 lb

## 2018-11-02 DIAGNOSIS — N138 Other obstructive and reflux uropathy: Secondary | ICD-10-CM

## 2018-11-02 DIAGNOSIS — R0989 Other specified symptoms and signs involving the circulatory and respiratory systems: Secondary | ICD-10-CM

## 2018-11-02 DIAGNOSIS — I10 Essential (primary) hypertension: Secondary | ICD-10-CM

## 2018-11-02 DIAGNOSIS — N401 Enlarged prostate with lower urinary tract symptoms: Secondary | ICD-10-CM | POA: Diagnosis not present

## 2018-11-02 DIAGNOSIS — Z136 Encounter for screening for cardiovascular disorders: Secondary | ICD-10-CM | POA: Diagnosis not present

## 2018-11-02 DIAGNOSIS — Z1211 Encounter for screening for malignant neoplasm of colon: Secondary | ICD-10-CM

## 2018-11-02 DIAGNOSIS — E782 Mixed hyperlipidemia: Secondary | ICD-10-CM

## 2018-11-02 DIAGNOSIS — Z131 Encounter for screening for diabetes mellitus: Secondary | ICD-10-CM

## 2018-11-02 DIAGNOSIS — Z0000 Encounter for general adult medical examination without abnormal findings: Secondary | ICD-10-CM

## 2018-11-02 DIAGNOSIS — Z79899 Other long term (current) drug therapy: Secondary | ICD-10-CM | POA: Diagnosis not present

## 2018-11-02 DIAGNOSIS — Z13220 Encounter for screening for lipoid disorders: Secondary | ICD-10-CM

## 2018-11-02 DIAGNOSIS — R5383 Other fatigue: Secondary | ICD-10-CM

## 2018-11-02 DIAGNOSIS — Z1329 Encounter for screening for other suspected endocrine disorder: Secondary | ICD-10-CM | POA: Diagnosis not present

## 2018-11-02 DIAGNOSIS — Z0001 Encounter for general adult medical examination with abnormal findings: Secondary | ICD-10-CM

## 2018-11-02 DIAGNOSIS — R350 Frequency of micturition: Secondary | ICD-10-CM

## 2018-11-02 DIAGNOSIS — Z125 Encounter for screening for malignant neoplasm of prostate: Secondary | ICD-10-CM

## 2018-11-02 DIAGNOSIS — Z8249 Family history of ischemic heart disease and other diseases of the circulatory system: Secondary | ICD-10-CM | POA: Diagnosis not present

## 2018-11-02 DIAGNOSIS — E559 Vitamin D deficiency, unspecified: Secondary | ICD-10-CM

## 2018-11-02 DIAGNOSIS — Z130 Encounter for screening for diseases of the blood and blood-forming organs and certain disorders involving the immune mechanism: Secondary | ICD-10-CM | POA: Diagnosis not present

## 2018-11-02 DIAGNOSIS — K219 Gastro-esophageal reflux disease without esophagitis: Secondary | ICD-10-CM

## 2018-11-02 DIAGNOSIS — Z87891 Personal history of nicotine dependence: Secondary | ICD-10-CM | POA: Diagnosis not present

## 2018-11-02 DIAGNOSIS — R7309 Other abnormal glucose: Secondary | ICD-10-CM

## 2018-11-02 DIAGNOSIS — K635 Polyp of colon: Secondary | ICD-10-CM

## 2018-11-02 DIAGNOSIS — Z23 Encounter for immunization: Secondary | ICD-10-CM | POA: Diagnosis not present

## 2018-11-02 DIAGNOSIS — R7303 Prediabetes: Secondary | ICD-10-CM

## 2018-11-02 DIAGNOSIS — Z1389 Encounter for screening for other disorder: Secondary | ICD-10-CM

## 2018-11-03 ENCOUNTER — Encounter: Payer: Self-pay | Admitting: Internal Medicine

## 2018-11-03 LAB — URINALYSIS, ROUTINE W REFLEX MICROSCOPIC
Bilirubin Urine: NEGATIVE
Glucose, UA: NEGATIVE
Hgb urine dipstick: NEGATIVE
Ketones, ur: NEGATIVE
Leukocytes,Ua: NEGATIVE
Nitrite: NEGATIVE
Protein, ur: NEGATIVE
Specific Gravity, Urine: 1.019 (ref 1.001–1.03)
pH: 5.5 (ref 5.0–8.0)

## 2018-11-03 LAB — COMPLETE METABOLIC PANEL WITH GFR
AG Ratio: 2.8 (calc) — ABNORMAL HIGH (ref 1.0–2.5)
ALT: 22 U/L (ref 9–46)
AST: 21 U/L (ref 10–35)
Albumin: 4.8 g/dL (ref 3.6–5.1)
Alkaline phosphatase (APISO): 62 U/L (ref 35–144)
BUN: 21 mg/dL (ref 7–25)
CO2: 28 mmol/L (ref 20–32)
Calcium: 10.7 mg/dL — ABNORMAL HIGH (ref 8.6–10.3)
Chloride: 103 mmol/L (ref 98–110)
Creat: 1.03 mg/dL (ref 0.70–1.25)
GFR, Est African American: 86 mL/min/{1.73_m2} (ref >=60)
GFR, Est Non African American: 74 mL/min/{1.73_m2} (ref >=60)
Globulin: 1.7 g/dL (calc) — ABNORMAL LOW (ref 1.9–3.7)
Glucose, Bld: 105 mg/dL — ABNORMAL HIGH (ref 65–99)
Potassium: 4.4 mmol/L (ref 3.5–5.3)
Sodium: 139 mmol/L (ref 135–146)
Total Bilirubin: 0.8 mg/dL (ref 0.2–1.2)
Total Protein: 6.5 g/dL (ref 6.1–8.1)

## 2018-11-03 LAB — MICROALBUMIN / CREATININE URINE RATIO
Creatinine, Urine: 91 mg/dL (ref 20–320)
Microalb Creat Ratio: 3 mcg/mg creat (ref <30)
Microalb, Ur: 0.3 mg/dL

## 2018-11-03 LAB — LIPID PANEL
Cholesterol: 146 mg/dL (ref <200)
HDL: 35 mg/dL — ABNORMAL LOW (ref >=40)
LDL Cholesterol (Calc): 90 mg/dL (calc)
Non-HDL Cholesterol (Calc): 111 mg/dL (calc) (ref <130)
Total CHOL/HDL Ratio: 4.2 (calc) (ref <5.0)
Triglycerides: 118 mg/dL (ref <150)

## 2018-11-03 LAB — CBC WITH DIFFERENTIAL/PLATELET
Absolute Monocytes: 616 cells/uL (ref 200–950)
Basophils Absolute: 42 cells/uL (ref 0–200)
Basophils Relative: 0.6 %
Eosinophils Absolute: 112 cells/uL (ref 15–500)
Eosinophils Relative: 1.6 %
HCT: 48.4 % (ref 38.5–50.0)
Hemoglobin: 16.8 g/dL (ref 13.2–17.1)
Lymphs Abs: 2198 cells/uL (ref 850–3900)
MCH: 30.5 pg (ref 27.0–33.0)
MCHC: 34.7 g/dL (ref 32.0–36.0)
MCV: 87.8 fL (ref 80.0–100.0)
MPV: 10.9 fL (ref 7.5–12.5)
Monocytes Relative: 8.8 %
Neutro Abs: 4032 cells/uL (ref 1500–7800)
Neutrophils Relative %: 57.6 %
Platelets: 226 10*3/uL (ref 140–400)
RBC: 5.51 10*6/uL (ref 4.20–5.80)
RDW: 12.8 % (ref 11.0–15.0)
Total Lymphocyte: 31.4 %
WBC: 7 10*3/uL (ref 3.8–10.8)

## 2018-11-03 LAB — INSULIN, RANDOM: Insulin: 4.3 u[IU]/mL

## 2018-11-03 LAB — VITAMIN D 25 HYDROXY (VIT D DEFICIENCY, FRACTURES): Vit D, 25-Hydroxy: 62 ng/mL (ref 30–100)

## 2018-11-03 LAB — IRON, TOTAL/TOTAL IRON BINDING CAP
%SAT: 31 % (calc) (ref 20–48)
Iron: 87 ug/dL (ref 50–180)
TIBC: 285 mcg/dL (calc) (ref 250–425)

## 2018-11-03 LAB — TESTOSTERONE: Testosterone: 135 ng/dL — ABNORMAL LOW (ref 250–827)

## 2018-11-03 LAB — PSA: PSA: 0.6 ng/mL (ref <=4.0)

## 2018-11-03 LAB — MAGNESIUM: Magnesium: 2.2 mg/dL (ref 1.5–2.5)

## 2018-11-03 LAB — HEMOGLOBIN A1C
Hgb A1c MFr Bld: 5.1 % of total Hgb (ref <5.7)
Mean Plasma Glucose: 100 (calc)
eAG (mmol/L): 5.5 (calc)

## 2018-11-03 LAB — TSH: TSH: 2.45 mIU/L (ref 0.40–4.50)

## 2018-11-03 LAB — VITAMIN B12: Vitamin B-12: 744 pg/mL (ref 200–1100)

## 2018-11-03 NOTE — Addendum Note (Signed)
Addended by: Unk Pinto on: 11/03/2018 03:38 PM   Modules accepted: Orders

## 2018-11-24 LAB — COLOGUARD: Cologuard: NEGATIVE

## 2018-11-26 ENCOUNTER — Encounter: Payer: Self-pay | Admitting: *Deleted

## 2019-04-11 IMAGING — CT CT HEAD W/O CM
3 series · 15 of 47 positions shown, 18 images · non-contrast
Comparison: None

CLINICAL DATA: Head trauma, struck on head and back by a falling
commercial mower, laceration, headache, history smoking

EXAM:
CT HEAD WITHOUT CONTRAST
TECHNIQUE: Contiguous axial images were obtained from the base of the skull
through the vertex without intravenous contrast. Sagittal and
coronal MPR images reconstructed from axial data set.

[Series 3: head 5.0 h30s · axial · 0.42mm/px · z∈[-73,+62]mm · 9 of 33 slices shown, 12 images]
[im 3/33  brain]
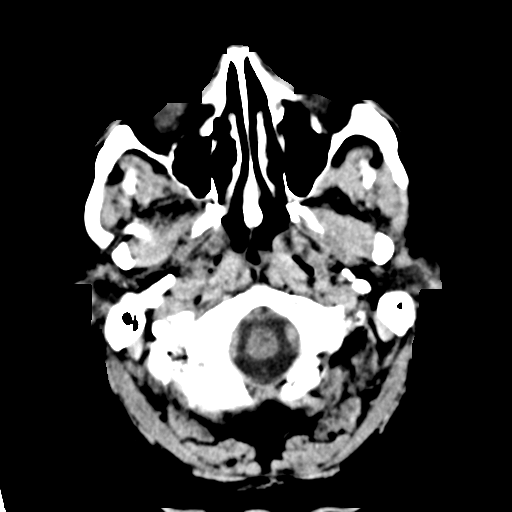
[im 3/33  bone]
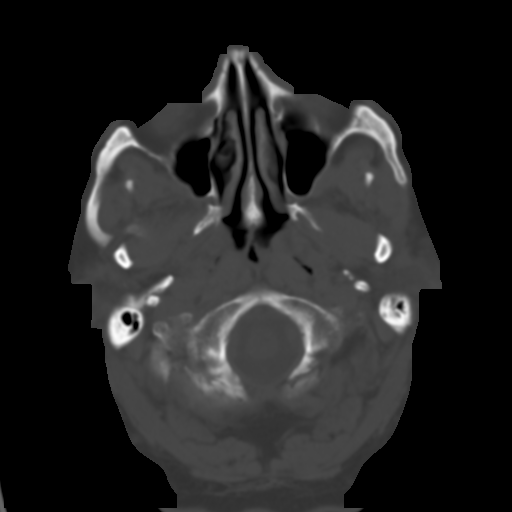
[im 6/33  brain]
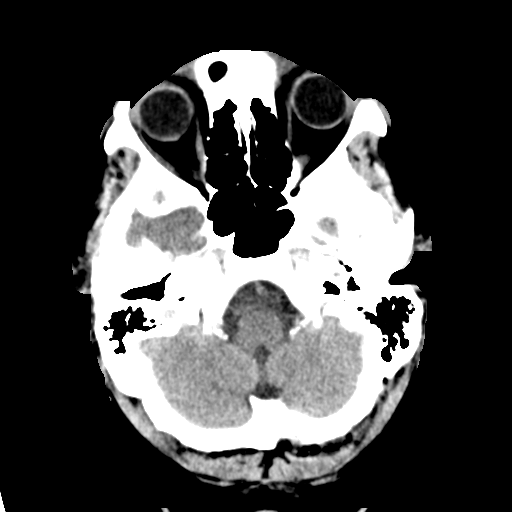
[im 9/33  brain]
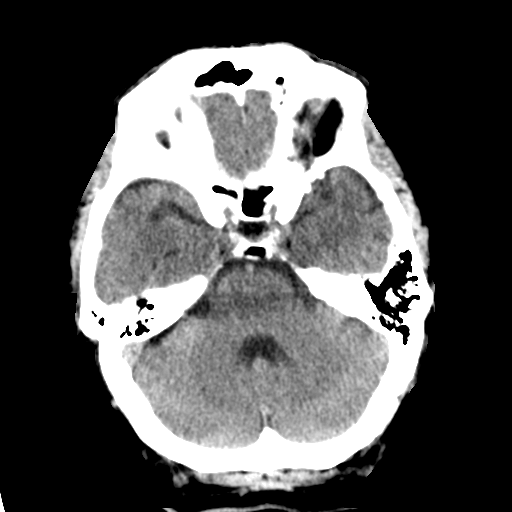
[im 13/33  brain]
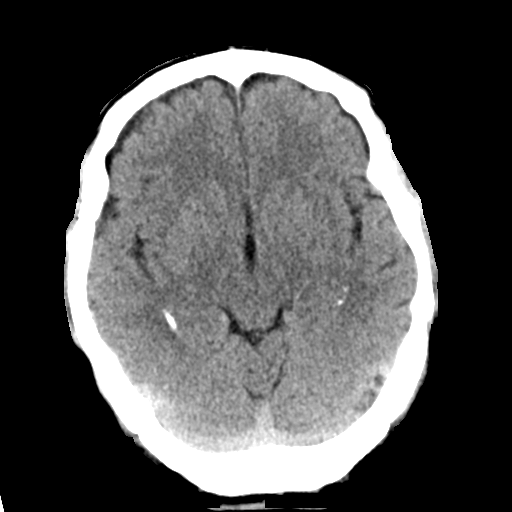
[im 17/33  brain]
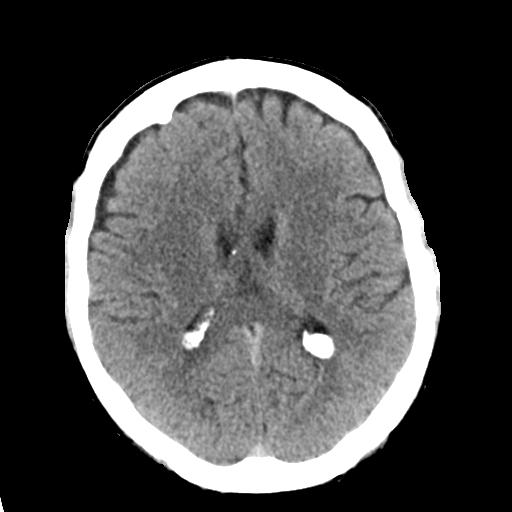
[im 17/33  bone]
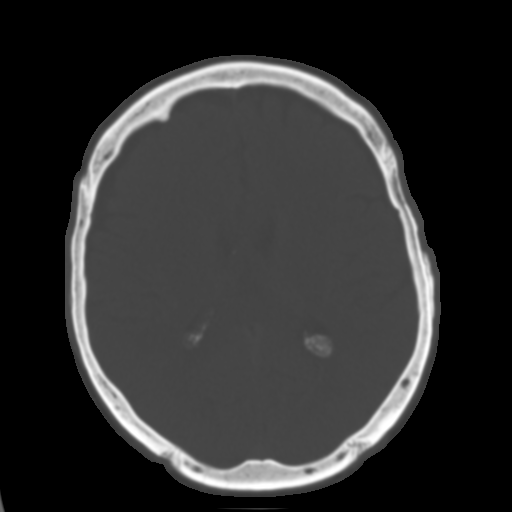
[im 20/33  brain]
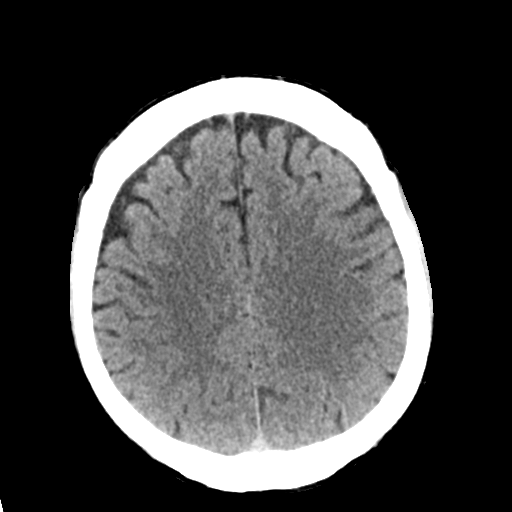
[im 24/33  brain]
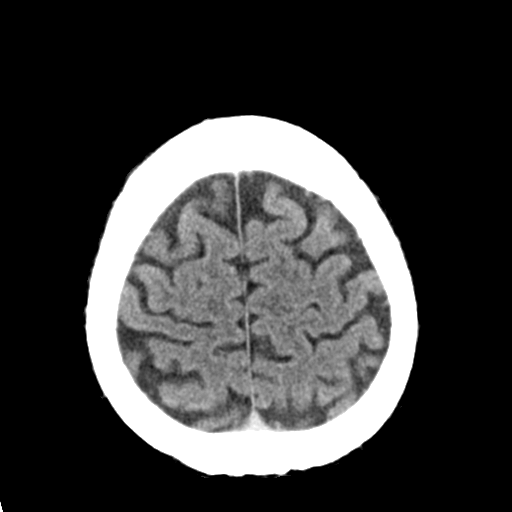
[im 27/33  brain]
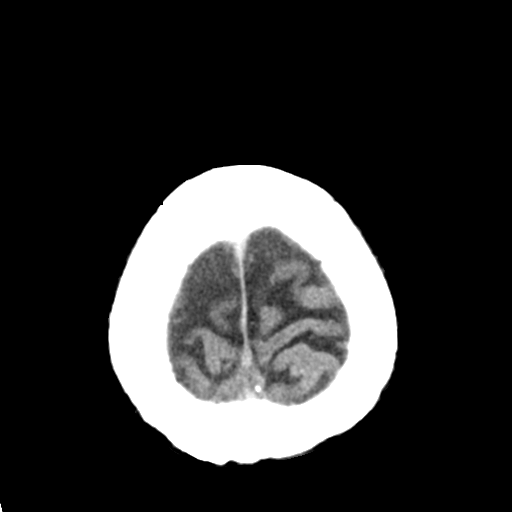
[im 30/33  brain]
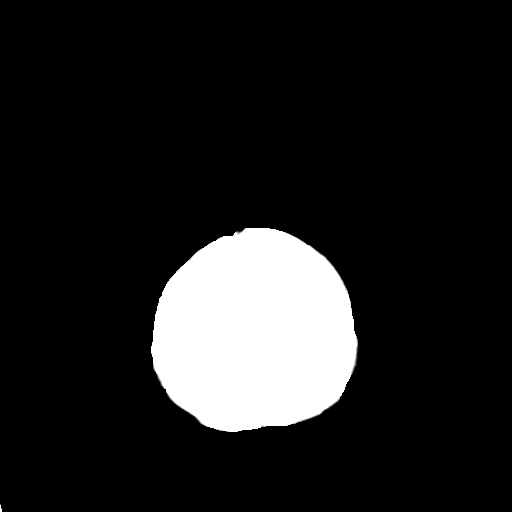
[im 30/33  bone]
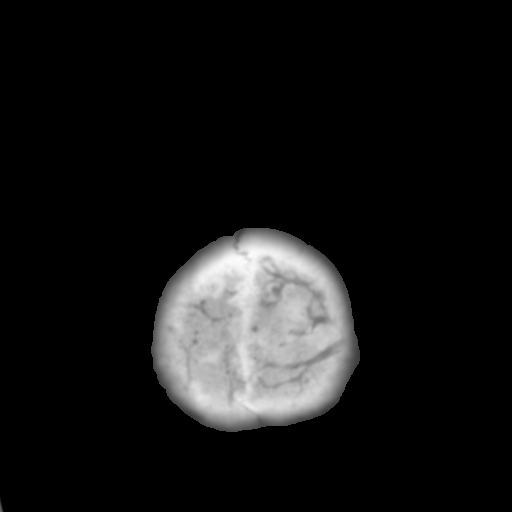

[Series 5: head 3.0 mpr cor · coronal · 0.33mm/px · 3 of 69 slices shown]
[im 23/69  brain]
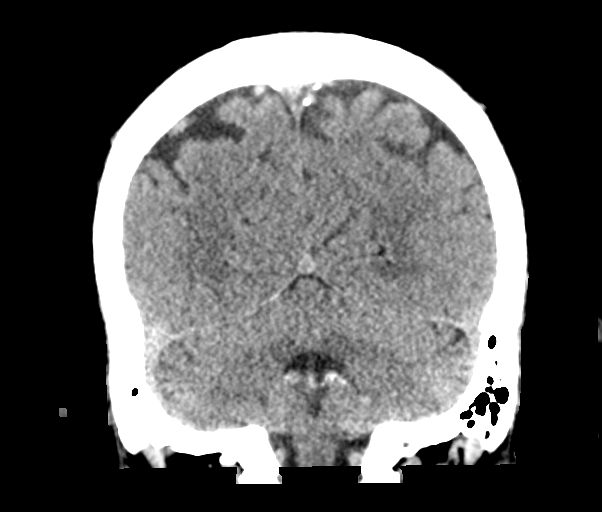
[im 31/69  brain]
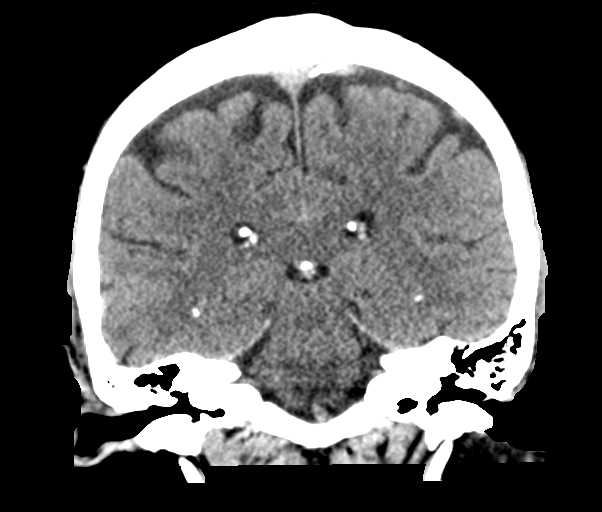
[im 38/69  brain]
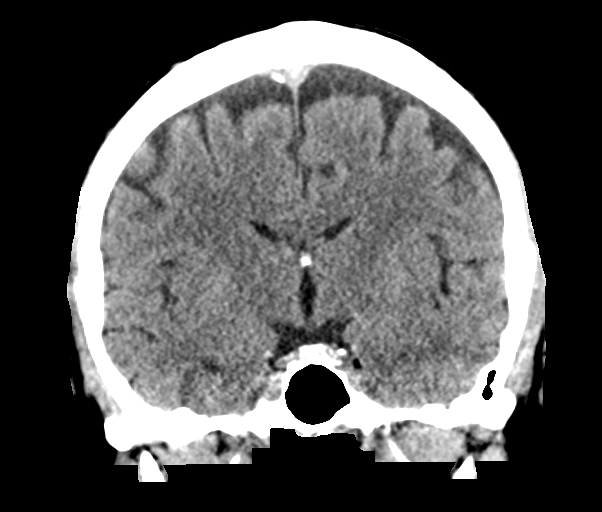

[Series 6: head 3.0 mpr sag · sagittal · 0.33mm/px · 3 of 58 slices shown]
[im 20/58  brain]
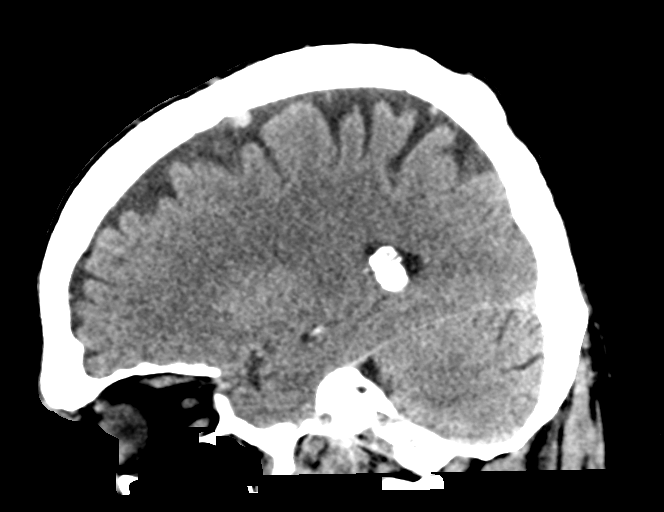
[im 29/58  brain]
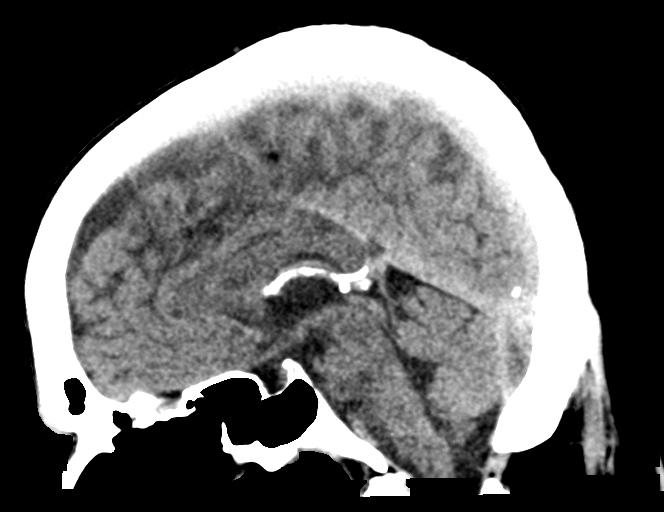
[im 39/58  brain]
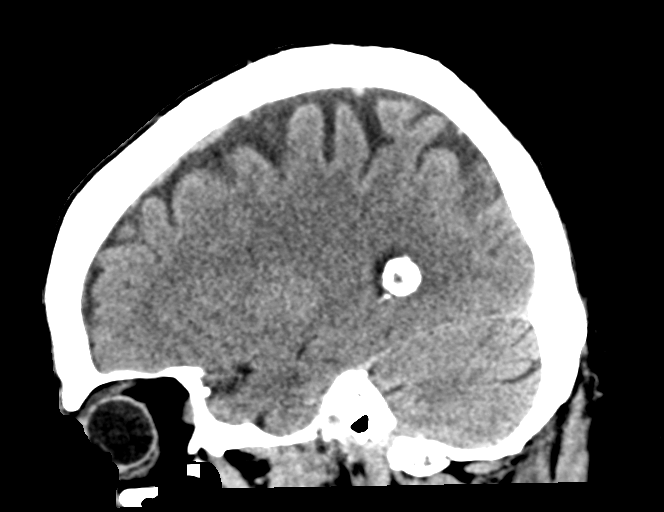

[15 of 47 positions shown; findings below may reference images not displayed]

FINDINGS: Brain: Mild generalized atrophy. Normal ventricular morphology. No
midline shift or mass effect. Otherwise normal appearance of brain
parenchyma. No intracranial hemorrhage, mass lesion or evidence of
acute infarction. No extra-axial fluid collections.

Vascular: No hyperdense vessels

Skull: Calvaria intact

Sinuses/Orbits: Clear

Other: N/A
IMPRESSION: No acute intracranial abnormalities.

## 2019-05-03 NOTE — Progress Notes (Signed)
FOLLOW UP  Assessment and Plan:   Essential hypertension -     CBC with Diff -     COMPLETE METABOLIC PANEL WITH GFR -     TSH - continue medications, DASH diet, exercise and monitor at home. Call if greater than 130/80.   Mixed hyperlipidemia -     Lipid Profile check lipids decrease fatty foods increase activity.   Medication management -     Magnesium  Abnormal glucose -     Hemoglobin A1c ([facility]) Discussed disease progression and risks Discussed diet/exercise, weight management and risk modification   Vitamin D deficiency -     Vitamin D (25 hydroxy)  Testosterone deficiency -     Testosterone, Total ? May benefit treatment  Bilateral leg weakness No appreciable weakness on exam ? Spinal stenosis- will get labs and refer to ortho for evaluation -     Sedimentation rate -     C-reactive protein -     CK -     Aldolase -     DG Lumbar Spine Complete; Future   Continue diet and meds as discussed. Further disposition pending results of labs. Over 30 minutes of exam, counseling, chart review, and critical decision making was performed  Future Appointments  Date Time Provider [place]  11/08/2019  2:00 PM Unk Pinto, MD [facility] None     HPI 68 y.o. right handed male  presents for 3 month follow up on hypertension, cholesterol, prediabetes, and vitamin D deficiency.   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 He states he is feeling weaker and weaker walking to the truck after he gets off of work. States he is having a hard time standing from a chair.  Denies any back pain. Patient denies fever, hematuria, incontinence, numbness, tingling and saddle anesthesia He was on zinc and smoothies in the morning that was helping with energy. He started to wear compression socks last two evenings and he is feeling slightly better.  Had normal stress test 2018 with Dr. Gwenlyn Found  Negative lyme, ANA 2017, neg RF, negative ESR.    He states he gets up at 430, he is waking up at 230. He does drink coffee after dinner. He has to get up to urinate once at night. He snores at night. When he wakes up at 430 he does feel like he has slept well.   His blood pressure has been controlled at home, today their BP is BP: 122/74  He does not workout. He denies chest pain, shortness of breath, dizziness. BMI is Body mass index is 29.21 kg/m., he is working on diet and exercise. Wt Readings from Last 3 Encounters:  05/04/19 209 lb 6.4 oz (95 kg)  11/02/18 197 lb 9.6 oz (89.6 kg)  05/21/18 207 lb (93.9 kg)    He  is not  on cholesterol medication and denies myalgias. His cholesterol is at goal. The cholesterol last visit was:   Lab Results  Component Value Date   CHOL 146 11/02/2018   HDL 35 (L) 11/02/2018   LDLCALC 90 11/02/2018   TRIG 118 11/02/2018   CHOLHDL 4.2 11/02/2018    Last A1C in the office was:  Lab Results  Component Value Date   HGBA1C 5.1 11/02/2018   Patient is on Vitamin D supplement.   Lab Results  Component Value Date   VD25OH 19 11/02/2018     He has a history of testosterone deficiency and is on  zinc and does smoothie with natural things to increase testosterone.  Lab Results  Component Value Date   TESTOSTERONE 135 (L) 11/02/2018     Current Medications:  Current Outpatient Medications on File Prior to Visit  Medication Sig  . aspirin 81 MG tablet Take 81 mg by mouth daily.  . Cholecalciferol (VITAMIN D PO) Take 10,000 Units by mouth daily.   . Flaxseed, Linseed, (FLAXSEED OIL PO) Take by mouth. Takes 1 daily  . Multiple Vitamins-Minerals (MULTIVITAMIN ADULT PO) Take 1 tablet by mouth daily.  Marland Kitchen OVER THE COUNTER MEDICATION Uses OTC Flonase NS PRN  . pantoprazole (PROTONIX) 20 MG tablet Take 1 tablet (20 mg total) by mouth daily. Take 1 tablet daily for Acid Indigestion & Reflux (Patient taking differently: Take 20 mg by mouth daily. Takes one tablet by mouth on M, W, F)  . zinc gluconate  50 MG tablet Take 50 mg by mouth daily.   No current facility-administered medications on file prior to visit.    Medical History:  Past Medical History:  Diagnosis Date  . Arthritis   . Cataract   . Hearing aid worn    Allergies:  Allergies  Allergen Reactions  . Codeine      Review of Systems:  Review of Systems  Constitutional: Positive for malaise/fatigue. Negative for chills, diaphoresis, fever and weight loss.  HENT: Negative.   Eyes: Negative.   Respiratory: Negative.  Negative for shortness of breath.   Cardiovascular: Negative.  Negative for chest pain.  Gastrointestinal: Negative.   Genitourinary: Negative.   Musculoskeletal: Positive for back pain and joint pain. Negative for falls, myalgias and neck pain.  Skin: Negative.   Neurological: Positive for weakness. Negative for dizziness, tingling, tremors, sensory change, speech change, focal weakness, seizures, loss of consciousness and headaches.  Psychiatric/Behavioral: Negative.  Negative for depression.    Family history- Review and unchanged Social history- Review and unchanged Physical Exam: BP 122/74   Pulse 84   Temp (!) 97.5 F (36.4 C)   Wt 209 lb 6.4 oz (95 kg)   SpO2 98%   BMI 29.21 kg/m  Wt Readings from Last 3 Encounters:  05/04/19 209 lb 6.4 oz (95 kg)  11/02/18 197 lb 9.6 oz (89.6 kg)  05/21/18 207 lb (93.9 kg)   General Appearance: Well nourished, in no apparent distress. Eyes: PERRLA, EOMs, conjunctiva no swelling or erythema Sinuses: No Frontal/maxillary tenderness ENT/Mouth: Ext aud canals clear, TMs without erythema, bulging. No erythema, swelling, or exudate on post pharynx.  Tonsils not swollen or erythematous. Hearing normal.  Neck: Supple, thyroid normal.  Respiratory: Respiratory effort normal, BS equal bilaterally without rales, rhonchi, wheezing or stridor.  Cardio: RRR with no MRGs. Brisk peripheral pulses without edema.  Abdomen: Soft, + BS,  Non tender, no guarding,  rebound, hernias, masses. Lymphatics: Non tender without lymphadenopathy.  Musculoskeletal: Full ROM, 5/5 strength, antalgic gait, Patient is able to ambulate well. Gait is  Antalgic. Straight leg raising with dorsiflexion negative bilaterally for radicular symptoms. Sensory exam in the legs are normal. Knee reflexes are normal Ankle reflexes are normal Strength is normal and symmetric in arms and legs. There is not SI tenderness to palpation.  There is not paraspinal muscle spasm.  There is not midline tenderness.  ROM of spine not limited Skin: Warm, dry without rashes, lesions, ecchymosis.  Neuro: Cranial nerves intact. Normal muscle tone, no cerebellar symptoms. Psych: Awake and oriented X 3, normal affect, Insight and Judgment appropriate.    Estill Bamberg  Silverio Lay, PA-C 4:55 PM [facility]

## 2019-05-04 ENCOUNTER — Encounter: Payer: Self-pay | Admitting: Physician Assistant

## 2019-05-04 ENCOUNTER — Other Ambulatory Visit: Payer: Self-pay

## 2019-05-04 ENCOUNTER — Ambulatory Visit: Payer: BC Managed Care – PPO | Admitting: Physician Assistant

## 2019-05-04 VITALS — BP 122/74 | HR 84 | Temp 97.5°F | Wt 209.4 lb

## 2019-05-04 DIAGNOSIS — E782 Mixed hyperlipidemia: Secondary | ICD-10-CM | POA: Diagnosis not present

## 2019-05-04 DIAGNOSIS — E559 Vitamin D deficiency, unspecified: Secondary | ICD-10-CM

## 2019-05-04 DIAGNOSIS — Z79899 Other long term (current) drug therapy: Secondary | ICD-10-CM

## 2019-05-04 DIAGNOSIS — R7309 Other abnormal glucose: Secondary | ICD-10-CM | POA: Diagnosis not present

## 2019-05-04 DIAGNOSIS — E349 Endocrine disorder, unspecified: Secondary | ICD-10-CM

## 2019-05-04 DIAGNOSIS — I10 Essential (primary) hypertension: Secondary | ICD-10-CM

## 2019-05-04 DIAGNOSIS — R29898 Other symptoms and signs involving the musculoskeletal system: Secondary | ICD-10-CM

## 2019-05-04 DIAGNOSIS — G479 Sleep disorder, unspecified: Secondary | ICD-10-CM

## 2019-05-04 NOTE — Patient Instructions (Addendum)
Check out [place] CBD topical oil  Work on the weight loss.   Do water bottle frozen and roll your foot on it Do stretches at night If it is not better in 6-8 weeks we can do an injection   Plantar Fasciitis  Plantar fasciitis is a painful foot condition that affects the heel. It occurs when the band of tissue that connects the toes to the heel bone (plantar fascia) becomes irritated. This can happen after exercising too much or doing other repetitive activities (overuse injury). The pain from plantar fasciitis can range from mild irritation to severe pain that makes it difficult for you to walk or move. The pain is usually worse in the morning or after you have been sitting or lying down for a while. What are the causes? This condition may be caused by:  Standing for long periods of time.  Wearing shoes that do not fit.  Doing high-impact activities, including running, aerobics, and ballet.  Being overweight.  Having an abnormal way of walking (gait).  Having tight calf muscles.  Having high arches in your feet.  Starting a new athletic activity.  What are the signs or symptoms? The main symptom of this condition is heel pain. Other symptoms include:  Pain that gets worse after activity or exercise.  Pain that is worse in the morning or after resting.  Pain that goes away after you walk for a few minutes.  How is this diagnosed? This condition may be diagnosed based on your signs and symptoms. Your health care provider will also do a physical exam to check for:  A tender area on the bottom of your foot.  A high arch in your foot.  Pain when you move your foot.  Difficulty moving your foot.  You may also need to have imaging studies to confirm the diagnosis. These can include:  X-rays.  Ultrasound.  MRI.  How is this treated? Treatment for plantar fasciitis depends on the severity of the condition. Your treatment may include:  Rest, ice, and  over-the-counter pain medicines to manage your pain.  Exercises to stretch your calves and your plantar fascia.  A splint that holds your foot in a stretched, upward position while you sleep (night splint).  Physical therapy to relieve symptoms and prevent problems in the future.  Cortisone injections to relieve severe pain.  Extracorporeal shock wave therapy (ESWT) to stimulate damaged plantar fascia with electrical impulses. It is often used as a last resort before surgery.  Surgery, if other treatments have not worked after 12 months.  Follow these instructions at home:  Take medicines only as directed by your health care provider.  Avoid activities that cause pain.  Roll the bottom of your foot over a bag of ice or a bottle of cold water. Do this for 20 minutes, 3-4 times a day.  Perform simple stretches as directed by your health care provider.  Try wearing athletic shoes with air-sole or gel-sole cushions or soft shoe inserts.  Wear a night splint while sleeping, if directed by your health care provider.  Keep all follow-up appointments with your health care provider. How is this prevented?  Do not perform exercises or activities that cause heel pain.  Consider finding low-impact activities if you continue to have problems.  Lose weight if you need to. The best way to prevent plantar fasciitis is to avoid the activities that aggravate your plantar fascia. Contact a health care provider if:  Your symptoms do  not go away after treatment with home care measures.  Your pain gets worse.  Your pain affects your ability to move or do your daily activities. This information is not intended to replace advice given to you by your health care provider. Make sure you discuss any questions you have with your health care provider. Document Released: 01/22/2001 Document Revised: 10/02/2015 Document Reviewed: 03/09/2014 Elsevier Interactive Patient Education  [employer]2018 Elsevier  Inc.   11 Tips to Follow:  1. No caffeine after 3pm: Avoid beverages with caffeine (soda, tea, energy drinks, etc.) especially after 3pm. 2. Don't go to bed hungry: Have your evening meal at least 3 hrs. before going to sleep. It's fine to have a small bedtime snack such as a glass of milk and a few crackers but don't have a big meal. 3. Have a nightly routine before bed: Plan on "winding down" before you go to sleep. Begin relaxing about 1 hour before you go to bed. Try doing a quiet activity such as listening to calming music, reading a book or meditating. 4. Turn off the TV and ALL electronics including video games, tablets, laptops, etc. 1 hour before sleep, and keep them out of the bedroom. 5. Turn off your cell phone and all notifications (new email and text alerts) or even better, leave your phone outside your room while you sleep. Studies have shown that a part of your brain continues to respond to certain lights and sounds even while you're still asleep. 6. Make your bedroom quiet, dark and cool. If you can't control the noise, try wearing earplugs or using a fan to block out other sounds. 7. Practice relaxation techniques. Try reading a book or meditating or drain your brain by writing a list of what you need to do the next day. 8. Don't nap unless you feel sick: you'll have a better night's sleep. 9. Don't smoke, or quit if you do. Nicotine, alcohol, and marijuana can all keep you awake. Talk to your health care provider if you need help with substance use. 10. Most importantly, wake up at the same time every day (or within 1 hour of your usual wake up time) EVEN on the weekends. A regular wake up time promotes sleep hygiene and prevents sleep problems. 11. Reduce exposure to bright light in the last three hours of the day before going to sleep. Maintaining good sleep hygiene and having good sleep habits lower your risk of developing sleep problems. Getting better sleep can also improve  your concentration and alertness. Try the simple steps in this guide. If you still have trouble getting enough rest, make an appointment with your health care provider.   INFORMATION ABOUT YOUR XRAY  Can walk into [address] for an [facility] [place] [place] [facility]. They will have the order and take you back. You do not any paper work, I should get the result back today or tomorrow. This order is good for a year.  Can call [phone] to schedule an appointment if you wish.    Spinal Stenosis  Spinal stenosis occurs when the open space (spinal canal) between the bones of your spine (vertebrae) narrows, putting pressure on the spinal cord or nerves. What are the causes? This condition is caused by areas of bone pushing into the central canals of your vertebrae. This condition may be present at birth (congenital), or it may be caused by:  Arthritic deterioration of your vertebrae (spinal degeneration). This usually starts around age 650.  Injury or trauma  to the spine.  Tumors in the spine.  Calcium deposits in the spine. What are the signs or symptoms? Symptoms of this condition include:  Pain in the neck or back that is generally worse with activities, particularly when standing and walking.  Numbness, tingling, hot or cold sensations, weakness, or weariness in your legs.  Pain going up and down the leg (sciatica).  Frequent episodes of falling.  A foot-slapping gait that leads to muscle weakness. In more serious cases, you may develop:  Problemspassing stool or passing urine.  Difficulty having sex.  Loss of feeling in part or all of your leg. Symptoms may come on slowly and get worse over time. How is this diagnosed? This condition is diagnosed based on your medical history and a physical exam. Tests will also be done, such as:  MRI.  CT scan.  X-ray. How is this treated? Treatment for this condition often focuses on managing your pain and any other  symptoms. Treatment may include:  Practicing good posture to lessen pressure on your nerves.  Exercising to strengthen muscles, build endurance, improve balance, and maintain good joint movement (range of motion).  Losing weight, if needed.  Taking medicines to reduce swelling, inflammation, or pain.  Assistive devices, such as a corset or brace. In some cases, surgery may be needed. The most common procedure is decompression laminectomy. This is done to remove excess bone that puts pressure on your nerve roots. Follow these instructions at home: Managing pain, stiffness, and swelling  Do all exercises and stretches as told by your health care provider.  Practice good posture. If you were given a brace or a corset, wear it as told by your health care provider.  Do not do any activities that cause pain. Ask your health care provider what activities are safe for you.  Do not lift anything that is heavier than 10 lb (4.5 kg) or the limit that your health care provider tells you.  Maintain a healthy weight. Talk with your health care provider if you need help losing weight.  If directed, apply heat to the affected area as often as told by your health care provider. Use the heat source that your health care provider recommends, such as a moist heat pack or a heating pad. ? Place a towel between your skin and the heat source. ? Leave the heat on for 20-30 minutes. ? Remove the heat if your skin turns bright red. This is especially important if you are not able to feel pain, heat, or cold. You may have a greater risk of getting burned. General instructions  Take over-the-counter and prescription medicines only as told by your health care provider.  Do not use any products that contain nicotine or tobacco, such as cigarettes and e-cigarettes. If you need help quitting, ask your health care provider.  Eat a healthy diet. This includes plenty of fruits and vegetables, whole grains, and  low-fat (lean) protein.  Keep all follow-up visits as told by your health care provider. This is important. Contact a health care provider if:  Your symptoms do not get better or they get worse.  You have a fever. Get help right away if:  You have new or worse pain in your neck or upper back.  You have severe pain that cannot be controlled with medicines.  You are dizzy.  You have vision problems, blurred vision, or double vision.  You have a severe headache that is worse when you stand.  You have  nausea or you vomit.  You develop new or worse numbness or tingling in your back or legs.  You have pain, redness, swelling, or warmth in your arm or leg. Summary  Spinal stenosis occurs when the open space (spinal canal) between the bones of your spine (vertebrae) narrows. This narrowing puts pressure on the spinal cord or nerves.  Spinal stenosis can cause numbness, weakness, or pain in the neck, back, and legs.  This condition may be caused by a birth defect, arthritic deterioration of your vertebrae, injury, tumors, or calcium deposits.  This condition is usually diagnosed with MRIs, CT scans, and X-rays. This information is not intended to replace advice given to you by your health care provider. Make sure you discuss any questions you have with your health care provider. Document Released: 07/20/2003 Document Revised: 04/11/2017 Document Reviewed: 04/03/2016 Elsevier Patient Education  2020 Elsevier Inc.  General eating tips  What to Avoid . Avoid added sugars o Often added sugar can be found in processed foods such as many condiments, dry cereals, cakes, cookies, chips, crisps, crackers, candies, sweetened drinks, etc.  o Read labels and AVOID/DECREASE use of foods with the following in their ingredient list: Sugar, fructose, high fructose corn syrup, sucrose, glucose, maltose, dextrose, molasses, cane sugar, brown sugar, any type of syrup, agave nectar, etc.   . Avoid  snacking in between meals- drink water or if you feel you need a snack, pick a high water content snack such as cucumbers, watermelon, or any veggie.  Marland Kitchen Avoid foods made with flour o If you are going to eat food made with flour, choose those made with whole-grains; and, minimize your consumption as much as is tolerable . Avoid processed foods o These foods are generally stocked in the middle of the grocery store.  o Focus on shopping on the perimeter of the grocery.  What to Include . Vegetables o GREEN LEAFY VEGETABLES: Kale, spinach, mustard greens, collard greens, cabbage, broccoli, etc. o OTHER: Asparagus, cauliflower, eggplant, carrots, peas, Brussel sprouts, tomatoes, bell peppers, zucchini, beets, cucumbers, etc. . Grains, seeds, and legumes o Beans: kidney beans, black eyed peas, garbanzo beans, black beans, pinto beans, etc. o Whole, unrefined grains: brown rice, barley, bulgur, oatmeal, etc. . Healthy fats  o Avoid highly processed fats such as vegetable oil o Examples of healthy fats: avocado, olives, virgin olive oil, dark chocolate (?72% Cocoa), nuts (peanuts, almonds, walnuts, cashews, pecans, etc.) o Please still do small amount of these healthy fats, they are dense in calories.  . Low - Moderate Intake of Animal Sources of Protein o Meat sources: chicken, [place], salmon, tuna. Limit to 4 ounces of meat at one time or the size of your palm. o Consider limiting dairy sources, but when choosing dairy focus on: PLAIN [place] yogurt, cottage cheese, high-protein milk . Fruit o Choose berries

## 2019-05-05 LAB — CBC WITH DIFFERENTIAL/PLATELET
Absolute Monocytes: 719 cells/uL (ref 200–950)
Basophils Absolute: 40 cells/uL (ref 0–200)
Basophils Relative: 0.5 %
Eosinophils Absolute: 198 cells/uL (ref 15–500)
Eosinophils Relative: 2.5 %
HCT: 48.3 % (ref 38.5–50.0)
Hemoglobin: 16.7 g/dL (ref 13.2–17.1)
Lymphs Abs: 2749 cells/uL (ref 850–3900)
MCH: 30.8 pg (ref 27.0–33.0)
MCHC: 34.6 g/dL (ref 32.0–36.0)
MCV: 89.1 fL (ref 80.0–100.0)
MPV: 10.6 fL (ref 7.5–12.5)
Monocytes Relative: 9.1 %
Neutro Abs: 4195 cells/uL (ref 1500–7800)
Neutrophils Relative %: 53.1 %
Platelets: 238 10*3/uL (ref 140–400)
RBC: 5.42 10*6/uL (ref 4.20–5.80)
RDW: 12.5 % (ref 11.0–15.0)
Total Lymphocyte: 34.8 %
WBC: 7.9 10*3/uL (ref 3.8–10.8)

## 2019-05-05 LAB — ALDOLASE: Aldolase: 5.8 U/L (ref <=8.1)

## 2019-05-05 LAB — COMPLETE METABOLIC PANEL WITH GFR
AG Ratio: 2.1 (calc) (ref 1.0–2.5)
ALT: 27 U/L (ref 9–46)
AST: 23 U/L (ref 10–35)
Albumin: 4.5 g/dL (ref 3.6–5.1)
Alkaline phosphatase (APISO): 69 U/L (ref 35–144)
BUN: 19 mg/dL (ref 7–25)
CO2: 28 mmol/L (ref 20–32)
Calcium: 10.4 mg/dL — ABNORMAL HIGH (ref 8.6–10.3)
Chloride: 104 mmol/L (ref 98–110)
Creat: 1.12 mg/dL (ref 0.70–1.25)
GFR, Est African American: 78 mL/min/{1.73_m2} (ref >=60)
GFR, Est Non African American: 67 mL/min/{1.73_m2} (ref >=60)
Globulin: 2.1 g/dL (calc) (ref 1.9–3.7)
Glucose, Bld: 111 mg/dL — ABNORMAL HIGH (ref 65–99)
Potassium: 4.3 mmol/L (ref 3.5–5.3)
Sodium: 140 mmol/L (ref 135–146)
Total Bilirubin: 0.6 mg/dL (ref 0.2–1.2)
Total Protein: 6.6 g/dL (ref 6.1–8.1)

## 2019-05-05 LAB — LIPID PANEL
Cholesterol: 158 mg/dL (ref <200)
HDL: 36 mg/dL — ABNORMAL LOW (ref >=40)
LDL Cholesterol (Calc): 90 mg/dL (calc)
Non-HDL Cholesterol (Calc): 122 mg/dL (calc) (ref <130)
Total CHOL/HDL Ratio: 4.4 (calc) (ref <5.0)
Triglycerides: 229 mg/dL — ABNORMAL HIGH (ref <150)

## 2019-05-05 LAB — CK: Total CK: 233 U/L — ABNORMAL HIGH (ref 44–196)

## 2019-05-05 LAB — SEDIMENTATION RATE: Sed Rate: 2 mm/h (ref 0–20)

## 2019-05-05 LAB — VITAMIN D 25 HYDROXY (VIT D DEFICIENCY, FRACTURES): Vit D, 25-Hydroxy: 59 ng/mL (ref 30–100)

## 2019-05-05 LAB — HEMOGLOBIN A1C
Hgb A1c MFr Bld: 5.1 % of total Hgb (ref <5.7)
Mean Plasma Glucose: 100 (calc)
eAG (mmol/L): 5.5 (calc)

## 2019-05-05 LAB — TSH: TSH: 3.58 mIU/L (ref 0.40–4.50)

## 2019-05-05 LAB — C-REACTIVE PROTEIN: CRP: 1.5 mg/L (ref <8.0)

## 2019-05-05 LAB — TESTOSTERONE: Testosterone: 227 ng/dL — ABNORMAL LOW (ref 250–827)

## 2019-05-05 LAB — MAGNESIUM: Magnesium: 2.1 mg/dL (ref 1.5–2.5)

## 2019-05-19 ENCOUNTER — Other Ambulatory Visit: Payer: Self-pay

## 2019-05-19 ENCOUNTER — Ambulatory Visit
Admission: RE | Admit: 2019-05-19 | Discharge: 2019-05-19 | Disposition: A | Discharge status: Home / Self Care | Payer: BC Managed Care – PPO | Source: Ambulatory Visit | Attending: Physician Assistant | Admitting: Physician Assistant

## 2019-05-19 DIAGNOSIS — R29898 Other symptoms and signs involving the musculoskeletal system: Secondary | ICD-10-CM

## 2019-05-20 ENCOUNTER — Encounter: Payer: Self-pay | Admitting: Physician Assistant

## 2019-05-20 DIAGNOSIS — I700 Atherosclerosis of aorta: Secondary | ICD-10-CM | POA: Insufficient documentation

## 2019-06-16 ENCOUNTER — Other Ambulatory Visit: Payer: Self-pay

## 2019-06-16 ENCOUNTER — Encounter: Payer: Self-pay | Admitting: Physician Assistant

## 2019-06-16 ENCOUNTER — Ambulatory Visit (INDEPENDENT_AMBULATORY_CARE_PROVIDER_SITE_OTHER): Payer: BC Managed Care – PPO | Admitting: Physician Assistant

## 2019-06-16 DIAGNOSIS — M4316 Spondylolisthesis, lumbar region: Secondary | ICD-10-CM | POA: Diagnosis not present

## 2019-06-16 DIAGNOSIS — R748 Abnormal levels of other serum enzymes: Secondary | ICD-10-CM | POA: Diagnosis not present

## 2019-06-16 MED ORDER — SILDENAFIL CITRATE 100 MG PO TABS: ORAL_TABLET | ORAL | 2 refills | Status: DC

## 2019-06-16 NOTE — Patient Instructions (Addendum)
Spondylolisthesis  I SUGGEST PHYSICAL THERAPY OF YOUR BACK  Spondylolisthesis is when one of the bones in the spine (vertebra) slips forward and out of place. This commonly occurs in the lower back (lumbar spine), but it can happen anywhere along the spine. What are the causes? This condition may be caused by:  A break or crack (stress fracture) in a bone in the spine from doing sports or physical activities that: ? Put a lot of strain on the bones in the lower back. ? Involve repetitive overstretching (hyperextension) of the spine.  Injury (trauma) from an accident.  Wear and tear that happens as a person grows older. What increases the risk? The following factors may make you more likely to develop this condition:  Participating in sports or activities such as: ? Gymnastics. ? Figure skating. ? Weight lifting. ? Football.  Having a condition that affects the bones, such as osteoarthritis or cancer.  Being overweight. What are the signs or symptoms? Symptoms of this condition may include:  Mild to severe pain in the legs, lower back, or buttocks.  An abnormal way of walking (abnormal gait).  Poor posture.  Muscle stiffness, specifically in the hamstrings. The hamstrings are in the backs of the thighs.  Weakness, numbness, or a tingling sensation in the legs.  Neck pain, if the injury is at the top of the spine. Symptoms may get worse when standing, and they may temporarily get better when sitting down or bending forward. In some cases, there may be no symptoms of this condition. How is this diagnosed? This condition may be diagnosed based on:  Your symptoms.  Your medical history.  A physical exam.  Imaging tests, such as: ? X-rays. ? CT scan. ? MRI. How is this treated? This condition may be treated by:  Resting.  Pain medicines.  NSAIDs, like ibuprofen, to help reduce swelling and discomfort.  Injections of medicine (cortisone) in your back. These  injections can help to relieve pain and numbness.  A brace to stabilize and support your back.  Physical therapy. You may work with an occupational therapist or physical therapist who can teach you how to reduce pressure on your back while you do everyday activities.  Surgery. This may be needed if: ? Other treatment methods do not improve your condition. ? Your symptoms do not go away after 3-6 months. ? You have changes in control of your stool or urine. ? You are unable to walk or stand. ? You have severe pain. Follow these instructions at home: Medicines  Take over-the-counter and prescription medicines only as told by your health care provider.  Ask your health care provider if the medicine prescribed to you: ? Requires you to avoid driving or using heavy machinery. ? Can cause constipation. You may need to take these actions to prevent or treat constipation:  Drink enough fluid to keep your urine pale yellow.  Take over-the-counter or prescription medicines.  Eat foods that are high in fiber, such as beans, whole grains, and fresh fruits and vegetables.  Limit foods that are high in fat and processed sugars, such as fried or sweet foods. If you have a brace:  Wear the brace as told by your health care provider. Remove it only as told by your health care provider.  Keep the brace clean.  If the brace is not waterproof: ? Do not let it get wet. ? Cover it with a watertight covering when you take a bath or a shower. Activity  Rest and return to your normal activities as told by your health care provider. Ask your health care provider what activities are safe for you.  Ask your health care provider when it is safe to drive if you have a back brace.  Work with a physical therapist to make a safe exercise program, as recommended by your health care provider. Do exercises as told by your physical therapist. This may include exercises to strengthen your back and abdominal  muscles (core exercises). Managing pain, stiffness, and swelling      If directed, put ice on the affected area. ? If you have a removable brace, remove it as told by your health care provider. ? Put ice in a plastic bag. ? Place a towel between your skin and the bag. ? Leave the ice on for 20 minutes, 2-3 times a day.  If directed, apply heat to the affected area as often as told by your health care provider. Use the heat source that your health care provider recommends, such as a moist heat pack or a heating pad. ? If you have a removable brace, remove it as told by your health care provider. ? Place a towel between your skin and the heat source. ? Leave the heat on for 20-30 minutes. ? Remove the heat if your skin turns bright red. This is especially important if you are unable to feel pain, heat, or cold. You may have a greater risk of getting burned. General instructions  Do not use any products that contain nicotine or tobacco, such as cigarettes, e-cigarettes, and chewing tobacco. These can delay bone healing. If you need help quitting, ask your health care provider.  If you are overweight, work with your health care provider and a dietitian to set a weight-loss goal that is healthy and reasonable for you.  Keep all follow-up visits as told by your health care provider. This is important. Contact a health care provider if:  You have pain that gets worse or does not get better. Get help right away if:  You have severe back or neck pain.  You have changes in control of your stool or urine.  You develop weakness or numbness in your legs.  You are unable to stand or walk. Summary  Spondylolisthesis is when one of the bones in the spine (vertebra) slips forward and out of place.  This condition may be treated with rest, medicines, wearing a brace, physical therapy, or surgery.  Rest and return to your normal activities as told by your health care provider. Ask your health  care provider what activities are safe for you.  Contact a health care provider if you have pain that gets worse or does not get better. This information is not intended to replace advice given to you by your health care provider. Make sure you discuss any questions you have with your health care provider. Document Revised: 08/20/2018 Document Reviewed: 12/02/2017 Elsevier Patient Education  2020 [employer].

## 2019-06-16 NOTE — Progress Notes (Signed)
Assessment and Plan:  Hypercalcemia -     Parathyroid Hormone, Intact w/Ca - with symptoms will check for hyperparathyroidism  Elevated CPK -     CK -     Aldolase - aldolase negative, will monitor  Spondylolisthesis of lumbar region Leg weakness has improved, discussed PT/ortho referral, declines at this time, given information.    Future Appointments  Date Time Provider Department Center  11/08/2019  2:00 PM Lucky Cowboy, MD [facility] None     HPI 68 y.o.male presents for follow up for leg weakness and fatigue.  He had an abnormal lumbar xray and was referred to ortho for evaluation for possible spinal stenosis.  He had a normal CBC, kidney, liver and thyroid other than his calcium was elevated, he was instructed to stop calcium supplements/tums and we will recheck today with a PTH. He had a normal CXR.  Lab Results  Component Value Date   CALCIUM 10.4 (H) 05/04/2019   His Sed rate and CRP were negative but his CPK was elevated but his aldolase was normal.  Lab Results  Component Value Date   CKTOTAL 233 (H) 05/04/2019   BMI is Body mass index is 29.01 kg/m., he is working on diet and exercise. Wt Readings from Last 3 Encounters:  06/16/19 208 lb (94.3 kg)  05/04/19 209 lb 6.4 oz (95 kg)  11/02/18 197 lb 9.6 oz (89.6 kg)     Patient Active Problem List   Diagnosis Date Noted  . Aortic atherosclerosis (HCC) 05/20/2019  . Medication management 07/19/2014  . Essential hypertension 04/28/2013  . Mixed hyperlipidemia 04/28/2013  . Abnormal glucose 04/28/2013  . Vitamin D deficiency 04/28/2013        Current Outpatient Medications (Analgesics):  .  aspirin 81 MG tablet, Take 81 mg by mouth daily.   Current Outpatient Medications (Other):  Marland Kitchen  Cholecalciferol (VITAMIN D PO), Take 10,000 Units by mouth daily.  .  Flaxseed, Linseed, (FLAXSEED OIL PO), Take by mouth. Takes 1 daily .  Multiple Vitamins-Minerals (MULTIVITAMIN ADULT PO), Take 1 tablet by  mouth daily. Marland Kitchen  OVER THE COUNTER MEDICATION, Uses OTC Flonase NS PRN .  zinc gluconate 50 MG tablet, Take 50 mg by mouth daily. .  pantoprazole (PROTONIX) 20 MG tablet, Take 1 tablet (20 mg total) by mouth daily. Take 1 tablet daily for Acid Indigestion & Reflux (Patient taking differently: Take 20 mg by mouth daily. Takes one tablet by mouth on M, W, F)  Allergies  Allergen Reactions  . Codeine     ROS: all negative except above.   Physical Exam: Filed Weights   06/16/19 1604  Weight: 208 lb (94.3 kg)   BP 110/64   Pulse 74   Temp (!) 97.5 F (36.4 C)   Wt 208 lb (94.3 kg)   SpO2 97%   BMI 29.01 kg/m  General Appearance: Well nourished, in no apparent distress. Eyes: PERRLA, EOMs, conjunctiva no swelling or erythema Sinuses: No Frontal/maxillary tenderness ENT/Mouth: Ext aud canals clear, TMs without erythema, bulging. No erythema, swelling, or exudate on post pharynx.  Tonsils not swollen or erythematous. Hearing normal.  Neck: Supple, thyroid normal.  Respiratory: Respiratory effort normal, BS equal bilaterally without rales, rhonchi, wheezing or stridor.  Cardio: RRR with no MRGs. Brisk peripheral pulses without edema.  Abdomen: Soft, + BS.  Non tender, no guarding, rebound, hernias, masses. Lymphatics: Non tender without lymphadenopathy.  Musculoskeletal: Full ROM, 5/5 strength, normal gait.  Skin: Warm, dry without rashes, lesions, ecchymosis.  Neuro:  Cranial nerves intact. Normal muscle tone, no cerebellar symptoms. Sensation intact.  Psych: Awake and oriented X 3, normal affect, Insight and Judgment appropriate.     Vicie Mutters, PA-C 4:33 PM [facility]

## 2019-06-17 LAB — PTH, INTACT AND CALCIUM
Calcium: 10.4 mg/dL — ABNORMAL HIGH (ref 8.6–10.3)
PTH: 50 pg/mL (ref 14–64)

## 2019-06-17 LAB — CK: Total CK: 248 U/L — ABNORMAL HIGH (ref 44–196)

## 2019-06-17 LAB — ALDOLASE: Aldolase: 6.4 U/L (ref <=8.1)

## 2019-06-21 NOTE — Progress Notes (Signed)
PATIENT IS AWARE OF LAB RESULTS. -E WELCH

## 2019-06-21 NOTE — Progress Notes (Signed)
06/21/2019----PATIENT IS AWARE OF LAB RESULTS AND INSTRUCTIONS. - E WELCH

## 2019-11-07 ENCOUNTER — Encounter: Payer: Self-pay | Admitting: Internal Medicine

## 2019-11-07 NOTE — Progress Notes (Signed)
Annual  Screening/Preventative Visit  & Comprehensive Evaluation & Examination     This very nice 69 y.o.  MWM  presents for a Screening /Preventative Visit & comprehensive evaluation and management of multiple medical co-morbidities.  Patient has been followed for HTN, HLD, Prediabetes and Vitamin D Deficiency. Patient's GERD is controlled on his meds.     Patient has been followed expectantly for labile HTN circa 2007. Patient's BP has been controlled at home.  Today's BP is at goal  - 116/72. Patient denies any cardiac symptoms as chest pain, palpitations, shortness of breath, dizziness or ankle swelling.     Patient's hyperlipidemia is controlled with diet and medications. Patient denies myalgias or other medication SE's. Last lipids were at goal except elevated Trig's:  Lab Results  Component Value Date   CHOL 158 05/04/2019   HDL 36 (L) 05/04/2019   LDLCALC 90 05/04/2019   TRIG 229 (H) 05/04/2019   CHOLHDL 4.4 05/04/2019      Patient has hx of Low T and last level in  Dec 20-21 was slightly low at 227.     Patient has hx/o abnormal glucose and is monitored expectantly for glucose intolerance.  Patient denies reactive hypoglycemic symptoms, visual blurring, diabetic polys or paresthesias. Last A1c was Normal & at goal:  Lab Results  Component Value Date   HGBA1C 5.1 05/04/2019        Finally, patient has history of Vitamin D Deficiency ("32" / 2010)  and last vitamin D  was at goal:  Lab Results  Component Value Date   VD25OH 59 05/04/2019    Current Outpatient Medications on File Prior to Visit  Medication Sig  . aspirin 81 MG tablet Take 81 mg by mouth daily.  . Cholecalciferol (VITAMIN D PO) Take 10,000 Units by mouth daily.   Marland Kitchen CINNAMON PO Take 1 capsule by mouth daily.  Marland Kitchen OVER THE COUNTER MEDICATION Uses OTC Flonase NS PRN  . OVER THE COUNTER MEDICATION OTC allergy tablet daily.  . sildenafil (VIAGRA) 100 MG tablet 1/2-1 pill as needed 1 hour before intercourse,  can get with good RX from [facility] [employer]  . TURMERIC PO Take 1 tablet by mouth daily.  Marland Kitchen zinc gluconate 50 MG tablet Take 50 mg by mouth daily.   No current facility-administered medications on file prior to visit.   Allergies  Allergen Reactions  . Codeine    Past Medical History:  Diagnosis Date  . Arthritis   . Cataract   . Hearing aid worn    Health Maintenance  Topic Date Due  . COVID-19 Vaccine (1) Never done  . PNA vac Low Risk Adult (2 of 2 - PPSV23) 11/02/2019  . INFLUENZA VACCINE  12/12/2019  . Fecal DNA (Cologuard)  11/23/2021  . TETANUS/TDAP  11/01/2028  . Hepatitis C Screening  Completed   Immunization History  Administered Date(s) Administered  . DT (Pediatric) 05/13/1996  . Hepatitis A 05/13/2009  . Influenza-Unspecified 02/10/2018  . PPD Test 06/03/2013, 07/19/2014, 09/16/2016  . Pneumococcal Conjugate-13 11/02/2018  . Td 11/02/2018  . Tdap 08/26/2008   Last Colon - 06/06/2005 - Dr Arlyce Dice and he's aware that he's over due 10 yr recall. He was sent a 10 year recall letter in 2017 by Dr Myrtie Neither.  Past Surgical History:  Procedure Laterality Date  . EYE SURGERY    . PANCREAS SURGERY     Family History  Problem Relation Age of Onset  . Heart disease Father 71  killed  . Heart disease Paternal Aunt 61  . Heart disease Paternal Uncle        3 uncles all died 39-70  . Heart disease Paternal Grandfather 85       MI died   Social History   Socioeconomic History  . Marital status: Married    Spouse name: Vickie  . Number of children: Not on file  . Years of education: Not on file  . Highest education level: Not on file  Occupational History  . Employment: works in Teacher, English as a foreign language for [employer].  Tobacco Use  . Smoking status: Former Smoker    Quit date: 05/13/1998    Years since quitting: 21.5  . Smokeless tobacco: Never Used  Vaping Use  . Vaping Use: Never used  Substance and Sexual Activity  . Alcohol use: No  . Drug  use: No  . Sexual activity: Not on file    ROS Constitutional: Denies fever, chills, weight loss/gain, headaches, insomnia,  night sweats or change in appetite. Does c/o fatigue. Eyes: Denies redness, blurred vision, diplopia, discharge, itchy or watery eyes.  ENT: Denies discharge, congestion, post nasal drip, epistaxis, sore throat, earache, hearing loss, dental pain, Tinnitus, Vertigo, Sinus pain or snoring.  Cardio: Denies chest pain, palpitations, irregular heartbeat, syncope, dyspnea, diaphoresis, orthopnea, PND, claudication or edema Respiratory: denies cough, dyspnea, DOE, pleurisy, hoarseness, laryngitis or wheezing.  Gastrointestinal: Denies dysphagia, heartburn, reflux, water brash, pain, cramps, nausea, vomiting, bloating, diarrhea, constipation, hematemesis, melena, hematochezia, jaundice or hemorrhoids Genitourinary: Denies dysuria, frequency, discharge, hematuria or flank pain. Has urgency, nocturia x 0-2  & hesitancy especially in the mornings.  Musculoskeletal: Denies arthralgia, myalgia, stiffness, Jt. Swelling, pain, limp or strain/sprain. Denies Falls. Skin: Denies puritis, rash, hives, warts, acne, eczema or change in skin lesion Neuro: No weakness, tremor, incoordination, spasms, paresthesia or pain Psychiatric: Denies confusion, memory loss or sensory loss. Denies Depression. Endocrine: Denies change in weight, skin, hair change, nocturia, and paresthesia, diabetic polys, visual blurring or hyper / hypo glycemic episodes.  Heme/Lymph: No excessive bleeding, bruising or enlarged lymph nodes.  Physical Exam  BP 116/72   Pulse 80   Temp (!) 97.5 F (36.4 C)   Resp 16   Ht 5\' 11"  (1.803 m)   Wt 197 lb 6.4 oz (89.5 kg)   BMI 27.53 kg/m   General Appearance: Well nourished and well groomed and in no apparent distress.  Eyes: PERRLA, EOMs, conjunctiva no swelling or erythema, normal fundi and vessels. Sinuses: No frontal/maxillary tenderness ENT/Mouth: EACs patent /  TMs  nl. Nares clear without erythema, swelling, mucoid exudates. Oral hygiene is good. No erythema, swelling, or exudate. Tongue normal, non-obstructing. Tonsils not swollen or erythematous. Hearing normal.  Neck: Supple, thyroid not palpable. No bruits, nodes or JVD. Respiratory: Respiratory effort normal.  BS equal and clear bilateral without rales, rhonci, wheezing or stridor. Cardio: Heart sounds are normal with regular rate and rhythm and no murmurs, rubs or gallops. Peripheral pulses are normal and equal bilaterally without edema. No aortic or femoral bruits. Chest: symmetric with normal excursions and percussion.  Abdomen: Soft, with Nl bowel sounds. Nontender, no guarding, rebound, hernias, masses, or organomegaly.  Lymphatics: Non tender without lymphadenopathy.  Musculoskeletal: Full ROM all peripheral extremities, joint stability, 5/5 strength, and normal gait. Skin: Warm and dry without rashes, lesions, cyanosis, clubbing or  ecchymosis.  Neuro: Cranial nerves intact, reflexes equal bilaterally. Normal muscle tone, no cerebellar symptoms. Sensation intact.  Pysch: Alert and oriented X 3 with  normal affect, insight and judgment appropriate.   Assessment and Plan  1. Annual Preventative/Screening Exam   2. Labile hypertension  - EKG 12-Lead - [place], RETROPERITNL ABD,  LTD - Urinalysis, Routine w reflex microscopic - Microalbumin / creatinine urine ratio - CBC with Differential/Platelet - COMPLETE METABOLIC PANEL WITH GFR - Magnesium - TSH  3. Hyperlipidemia, mixed  - EKG 12-Lead - [place], RETROPERITNL ABD,  LTD - Lipid panel - TSH  4. Abnormal glucose  - EKG 12-Lead - [place], RETROPERITNL ABD,  LTD - Hemoglobin A1c - Insulin, random  5. Vitamin D deficiency  - VITAMIN D 25 Hydroxy  6. Testosterone deficiency  - Testosterone  7. Gastroesophageal reflux disease   - CBC with Differential/Platelet  8. Screening for colorectal cancer  - POC Hemoccult Bld/Stl  9.  BPH with obstruction/lower urinary tract symptoms  - New Rx for Proscar & Flomax - discussed effects & SE's.   - PSA  10. Prostate cancer screening  - PSA  11. Screening for ischemic heart disease  - EKG 12-Lead  12. FHx: heart disease  - EKG 12-Lead - [place], RETROPERITNL ABD,  LTD  13. Former smoker  - [place], RETROPERITNL ABD,  LTD  14. Screening for AAA (abdominal aortic aneurysm)  - [place], RETROPERITNL ABD,  LTD  15. Fatigue, unspecified type  - Iron,Total/Total Iron Binding Cap - Vitamin B12  16. Medication management  - Urinalysis, Routine w reflex microscopic - Microalbumin / creatinine urine ratio - CBC with Differential/Platelet - COMPLETE METABOLIC PANEL WITH GFR - Magnesium - Lipid panel - TSH - Hemoglobin A1c - Insulin, random - VITAMIN D 25 Hydroxy         Patient was counseled in prudent diet, weight control to achieve/maintain BMI less than 25, BP monitoring, regular exercise and medications as discussed.  Discussed med effects and SE's. Routine screening labs and tests as requested with regular follow-up as recommended. Over 40 minutes of exam, counseling, chart review and high complex critical decision making was performed   Kirtland Bouchard, MD

## 2019-11-07 NOTE — Patient Instructions (Signed)

## 2019-11-08 ENCOUNTER — Other Ambulatory Visit: Payer: Self-pay

## 2019-11-08 ENCOUNTER — Ambulatory Visit: Payer: BC Managed Care – PPO | Admitting: Internal Medicine

## 2019-11-08 VITALS — BP 116/72 | HR 80 | Temp 97.5°F | Resp 16 | Ht 71.0 in | Wt 197.4 lb

## 2019-11-08 DIAGNOSIS — Z13220 Encounter for screening for lipoid disorders: Secondary | ICD-10-CM

## 2019-11-08 DIAGNOSIS — Z87891 Personal history of nicotine dependence: Secondary | ICD-10-CM

## 2019-11-08 DIAGNOSIS — Z0000 Encounter for general adult medical examination without abnormal findings: Secondary | ICD-10-CM | POA: Diagnosis not present

## 2019-11-08 DIAGNOSIS — R350 Frequency of micturition: Secondary | ICD-10-CM | POA: Diagnosis not present

## 2019-11-08 DIAGNOSIS — Z1211 Encounter for screening for malignant neoplasm of colon: Secondary | ICD-10-CM

## 2019-11-08 DIAGNOSIS — Z131 Encounter for screening for diabetes mellitus: Secondary | ICD-10-CM

## 2019-11-08 DIAGNOSIS — Z1389 Encounter for screening for other disorder: Secondary | ICD-10-CM | POA: Diagnosis not present

## 2019-11-08 DIAGNOSIS — Z130 Encounter for screening for diseases of the blood and blood-forming organs and certain disorders involving the immune mechanism: Secondary | ICD-10-CM

## 2019-11-08 DIAGNOSIS — Z1329 Encounter for screening for other suspected endocrine disorder: Secondary | ICD-10-CM

## 2019-11-08 DIAGNOSIS — E349 Endocrine disorder, unspecified: Secondary | ICD-10-CM

## 2019-11-08 DIAGNOSIS — R0989 Other specified symptoms and signs involving the circulatory and respiratory systems: Secondary | ICD-10-CM | POA: Diagnosis not present

## 2019-11-08 DIAGNOSIS — E559 Vitamin D deficiency, unspecified: Secondary | ICD-10-CM | POA: Diagnosis not present

## 2019-11-08 DIAGNOSIS — N401 Enlarged prostate with lower urinary tract symptoms: Secondary | ICD-10-CM

## 2019-11-08 DIAGNOSIS — Z0001 Encounter for general adult medical examination with abnormal findings: Secondary | ICD-10-CM

## 2019-11-08 DIAGNOSIS — Z136 Encounter for screening for cardiovascular disorders: Secondary | ICD-10-CM | POA: Diagnosis not present

## 2019-11-08 DIAGNOSIS — Z8249 Family history of ischemic heart disease and other diseases of the circulatory system: Secondary | ICD-10-CM

## 2019-11-08 DIAGNOSIS — N138 Other obstructive and reflux uropathy: Secondary | ICD-10-CM

## 2019-11-08 DIAGNOSIS — R7309 Other abnormal glucose: Secondary | ICD-10-CM

## 2019-11-08 DIAGNOSIS — Z125 Encounter for screening for malignant neoplasm of prostate: Secondary | ICD-10-CM

## 2019-11-08 DIAGNOSIS — K219 Gastro-esophageal reflux disease without esophagitis: Secondary | ICD-10-CM

## 2019-11-08 DIAGNOSIS — Z79899 Other long term (current) drug therapy: Secondary | ICD-10-CM

## 2019-11-08 DIAGNOSIS — E782 Mixed hyperlipidemia: Secondary | ICD-10-CM

## 2019-11-08 DIAGNOSIS — R5383 Other fatigue: Secondary | ICD-10-CM

## 2019-11-08 MED ORDER — TAMSULOSIN HCL 0.4 MG PO CAPS: ORAL_CAPSULE | ORAL | 0 refills | Status: DC

## 2019-11-08 MED ORDER — FINASTERIDE 5 MG PO TABS: ORAL_TABLET | ORAL | 0 refills | Status: DC

## 2019-11-09 LAB — CBC WITH DIFFERENTIAL/PLATELET
Absolute Monocytes: 636 cells/uL (ref 200–950)
Basophils Absolute: 52 cells/uL (ref 0–200)
Basophils Relative: 0.7 %
Eosinophils Absolute: 126 cells/uL (ref 15–500)
Eosinophils Relative: 1.7 %
HCT: 46.5 % (ref 38.5–50.0)
Hemoglobin: 15.9 g/dL (ref 13.2–17.1)
Lymphs Abs: 2190 cells/uL (ref 850–3900)
MCH: 30.2 pg (ref 27.0–33.0)
MCHC: 34.2 g/dL (ref 32.0–36.0)
MCV: 88.4 fL (ref 80.0–100.0)
MPV: 10.8 fL (ref 7.5–12.5)
Monocytes Relative: 8.6 %
Neutro Abs: 4396 cells/uL (ref 1500–7800)
Neutrophils Relative %: 59.4 %
Platelets: 216 10*3/uL (ref 140–400)
RBC: 5.26 10*6/uL (ref 4.20–5.80)
RDW: 13 % (ref 11.0–15.0)
Total Lymphocyte: 29.6 %
WBC: 7.4 10*3/uL (ref 3.8–10.8)

## 2019-11-09 LAB — COMPLETE METABOLIC PANEL WITH GFR
AG Ratio: 2.3 (calc) (ref 1.0–2.5)
ALT: 24 U/L (ref 9–46)
AST: 22 U/L (ref 10–35)
Albumin: 4.5 g/dL (ref 3.6–5.1)
Alkaline phosphatase (APISO): 65 U/L (ref 35–144)
BUN: 18 mg/dL (ref 7–25)
CO2: 29 mmol/L (ref 20–32)
Calcium: 10.2 mg/dL (ref 8.6–10.3)
Chloride: 101 mmol/L (ref 98–110)
Creat: 1.01 mg/dL (ref 0.70–1.25)
GFR, Est African American: 88 mL/min/{1.73_m2} (ref >=60)
GFR, Est Non African American: 76 mL/min/{1.73_m2} (ref >=60)
Globulin: 2 g/dL (calc) (ref 1.9–3.7)
Glucose, Bld: 108 mg/dL — ABNORMAL HIGH (ref 65–99)
Potassium: 4.1 mmol/L (ref 3.5–5.3)
Sodium: 140 mmol/L (ref 135–146)
Total Bilirubin: 0.7 mg/dL (ref 0.2–1.2)
Total Protein: 6.5 g/dL (ref 6.1–8.1)

## 2019-11-09 LAB — HEMOGLOBIN A1C
Hgb A1c MFr Bld: 5.1 % of total Hgb (ref <5.7)
Mean Plasma Glucose: 100 (calc)
eAG (mmol/L): 5.5 (calc)

## 2019-11-09 LAB — MICROALBUMIN / CREATININE URINE RATIO
Creatinine, Urine: 149 mg/dL (ref 20–320)
Microalb Creat Ratio: 2 mcg/mg creat (ref <30)
Microalb, Ur: 0.3 mg/dL

## 2019-11-09 LAB — URINALYSIS, ROUTINE W REFLEX MICROSCOPIC
Bilirubin Urine: NEGATIVE
Glucose, UA: NEGATIVE
Hgb urine dipstick: NEGATIVE
Ketones, ur: NEGATIVE
Leukocytes,Ua: NEGATIVE
Nitrite: NEGATIVE
Protein, ur: NEGATIVE
Specific Gravity, Urine: 1.023 (ref 1.001–1.03)
pH: 6.5 (ref 5.0–8.0)

## 2019-11-09 LAB — IRON, TOTAL/TOTAL IRON BINDING CAP
%SAT: 30 % (calc) (ref 20–48)
Iron: 87 ug/dL (ref 50–180)
TIBC: 293 mcg/dL (calc) (ref 250–425)

## 2019-11-09 LAB — LIPID PANEL
Cholesterol: 149 mg/dL (ref <200)
HDL: 39 mg/dL — ABNORMAL LOW (ref >=40)
LDL Cholesterol (Calc): 82 mg/dL (calc)
Non-HDL Cholesterol (Calc): 110 mg/dL (calc) (ref <130)
Total CHOL/HDL Ratio: 3.8 (calc) (ref <5.0)
Triglycerides: 180 mg/dL — ABNORMAL HIGH (ref <150)

## 2019-11-09 LAB — TSH: TSH: 1.89 mIU/L (ref 0.40–4.50)

## 2019-11-09 LAB — PSA: PSA: 0.6 ng/mL (ref <=4.0)

## 2019-11-09 LAB — VITAMIN D 25 HYDROXY (VIT D DEFICIENCY, FRACTURES): Vit D, 25-Hydroxy: 73 ng/mL (ref 30–100)

## 2019-11-09 LAB — INSULIN, RANDOM: Insulin: 15.6 u[IU]/mL

## 2019-11-09 LAB — MAGNESIUM: Magnesium: 2.1 mg/dL (ref 1.5–2.5)

## 2019-11-09 LAB — TESTOSTERONE: Testosterone: 240 ng/dL — ABNORMAL LOW (ref 250–827)

## 2019-11-09 LAB — VITAMIN B12: Vitamin B-12: 555 pg/mL (ref 200–1100)

## 2019-11-09 NOTE — Progress Notes (Signed)
===========================================================  -    Iron & Vitamin B12 levels are both Normal & OK  ===========================================================  -  PSA - Low - Great  ===========================================================  -  Testosterone - sl low  ===========================================================  -  Total Chol =   149   and LDL Chol =   82   - Both Excellent   - Very low risk for Heart Attack  / Stroke =============================================================  -  A1c - Normal - Great - No Diabetes ===========================================================  -  Vitamin D = 73 - Excellent  ===========================================================  -  All Else - CBC - Kidneys - Electrolytes -  Liver - Magnesium & Thyroid  - all  Normal / OK ===========================================================

## 2020-02-21 ENCOUNTER — Other Ambulatory Visit: Payer: Self-pay

## 2020-02-21 ENCOUNTER — Ambulatory Visit: Payer: BC Managed Care – PPO

## 2020-02-22 ENCOUNTER — Ambulatory Visit: Payer: BC Managed Care – PPO

## 2020-02-23 ENCOUNTER — Ambulatory Visit (INDEPENDENT_AMBULATORY_CARE_PROVIDER_SITE_OTHER): Payer: BC Managed Care – PPO

## 2020-02-23 ENCOUNTER — Other Ambulatory Visit: Payer: Self-pay

## 2020-02-23 VITALS — Temp 97.5°F

## 2020-02-23 DIAGNOSIS — Z23 Encounter for immunization: Secondary | ICD-10-CM

## 2020-04-19 ENCOUNTER — Encounter: Payer: Self-pay | Admitting: Internal Medicine

## 2020-04-19 ENCOUNTER — Ambulatory Visit (INDEPENDENT_AMBULATORY_CARE_PROVIDER_SITE_OTHER): Payer: BC Managed Care – PPO | Admitting: Internal Medicine

## 2020-04-19 ENCOUNTER — Other Ambulatory Visit: Payer: Self-pay

## 2020-04-19 VITALS — BP 118/62 | HR 86 | Temp 97.2°F | Resp 16 | Ht 71.0 in | Wt 196.4 lb

## 2020-04-19 DIAGNOSIS — R7309 Other abnormal glucose: Secondary | ICD-10-CM | POA: Diagnosis not present

## 2020-04-19 DIAGNOSIS — R0989 Other specified symptoms and signs involving the circulatory and respiratory systems: Secondary | ICD-10-CM

## 2020-04-19 DIAGNOSIS — E559 Vitamin D deficiency, unspecified: Secondary | ICD-10-CM

## 2020-04-19 DIAGNOSIS — E782 Mixed hyperlipidemia: Secondary | ICD-10-CM

## 2020-04-19 DIAGNOSIS — Z79899 Other long term (current) drug therapy: Secondary | ICD-10-CM | POA: Diagnosis not present

## 2020-04-19 MED ORDER — SILDENAFIL CITRATE 100 MG PO TABS: ORAL_TABLET | ORAL | 2 refills | Status: DC

## 2020-04-19 NOTE — Progress Notes (Signed)
History of Present Illness:       This very nice 69 y.o.  MWM presents for 6  month follow up with HTN, HLD, Pre-Diabetes and Vitamin D Deficiency.       Patient is followed expectantly for labile  HTN (2007) & BP has been controlled at home. Today's BP: 118/62. Patient has had no complaints of any cardiac type chest pain, palpitations, dyspnea / orthopnea / PND, dizziness, claudication, or dependent edema.      Hyperlipidemia is controlled with diet & meds. Patient denies myalgias or other med SE's. Last Lipids were at goal except elevated Trig's:  Lab Results  Component Value Date   CHOL 149 11/08/2019   HDL 39 (L) 11/08/2019   LDLCALC 82 11/08/2019   TRIG 180 (H) 11/08/2019   CHOLHDL 3.8 11/08/2019    Also, the patient has history abnormal  glucose and has had no symptoms of reactive hypoglycemia, diabetic polys, paresthesias or visual blurring.  Last A1c was at goal:  Lab Results  Component Value Date   HGBA1C 5.1 11/08/2019           Further, the patient also has history of Vitamin D Deficiency and supplements vitamin D without any suspected side-effects. Last vitamin D was at goal:   Current Outpatient Medications on File Prior to Visit  Medication Sig  . Cholecalciferol (VITAMIN D PO) Take 10,000 Units daily.   . OTC Flonase NS  Uses PRN  . OTC allergy  tablet daily.  . TURMERIC PO Take 1 tablet  daily.  Marland Kitchen zinc gluconate 50 MG tablet Take daily.  Marland Kitchen aspirin 81 MG tablet Take 81 mg by mouth daily. (Patient not taking: Reported on 04/19/2020)  . CINNAMON PO Take 1 capsule by mouth daily. (Patient not taking: Reported on 04/19/2020)  . finasteride 5 MG tablet Take 1 tablet Daily for prostate (Patient not taking: Reported on 04/19/2020)  . tamsulosin 0.4 MG CAPS capsule Take 1 tablet Daily for Prostate (Patient not taking: Reported on 04/19/2020)   No current facility-administered medications on file prior to visit.    Allergies  Allergen Reactions  . Codeine      PMHx:   Past Medical History:  Diagnosis Date  . Arthritis   . Cataract   . Hearing aid worn     Immunization History  Administered Date(s) Administered  . DT (Pediatric) 05/13/1996  . Hepatitis A 05/13/2009  . Influenza, High Dose Seasonal PF 02/23/2020  . Influenza-Unspecified 02/10/2018  . PPD Test 06/03/2013, 07/19/2014, 09/16/2016  . Pneumococcal Conjugate-13 11/02/2018  . Td 11/02/2018  . Tdap 08/26/2008    Past Surgical History:  Procedure Laterality Date  . EYE SURGERY    . PANCREAS SURGERY      FHx:    Reviewed / unchanged  SHx:    Reviewed / unchanged   Systems Review:  Constitutional: Denies fever, chills, wt changes, headaches, insomnia, fatigue, night sweats, change in appetite. Eyes: Denies redness, blurred vision, diplopia, discharge, itchy, watery eyes.  ENT: Denies discharge, congestion, post nasal drip, epistaxis, sore throat, earache, hearing loss, dental pain, tinnitus, vertigo, sinus pain, snoring.  CV: Denies chest pain, palpitations, irregular heartbeat, syncope, dyspnea, diaphoresis, orthopnea, PND, claudication or edema. Respiratory: denies cough, dyspnea, DOE, pleurisy, hoarseness, laryngitis, wheezing.  Gastrointestinal: Denies dysphagia, odynophagia, heartburn, reflux, water brash, abdominal pain or cramps, nausea, vomiting, bloating, diarrhea, constipation, hematemesis, melena, hematochezia  or hemorrhoids. Genitourinary: Denies dysuria, frequency, urgency, nocturia, hesitancy, discharge, hematuria or flank  pain. Musculoskeletal: Denies arthralgias, myalgias, stiffness, jt. swelling, pain, limping or strain/sprain.  Skin: Denies pruritus, rash, hives, warts, acne, eczema or change in skin lesion(s). Neuro: No weakness, tremor, incoordination, spasms, paresthesia or pain. Psychiatric: Denies confusion, memory loss or sensory loss. Endo: Denies change in weight, skin or hair change.  Heme/Lymph: No excessive bleeding, bruising or enlarged  lymph nodes.  Physical Exam  BP 118/62   Pulse 86   Temp (!) 97.2 F (36.2 C)   Resp 16   Ht 5\' 11"  (1.803 m)   Wt 196 lb 6.4 oz (89.1 kg)   SpO2 93%   BMI 27.39 kg/m   Appears  well nourished, well groomed  and in no distress.  Eyes: PERRLA, EOMs, conjunctiva no swelling or erythema. Sinuses: No frontal/maxillary tenderness ENT/Mouth: EAC's clear, TM's nl w/o erythema, bulging. Nares clear w/o erythema, swelling, exudates. Oropharynx clear without erythema or exudates. Oral hygiene is good. Tongue normal, non obstructing. Hearing intact.  Neck: Supple. Thyroid not palpable. Car 2+/2+ without bruits, nodes or JVD. Chest: Respirations nl with BS clear & equal w/o rales, rhonchi, wheezing or stridor.  Cor: Heart sounds normal w/ regular rate and rhythm without sig. murmurs, gallops, clicks or rubs. Peripheral pulses normal and equal  without edema.  Abdomen: Soft & bowel sounds normal. Non-tender w/o guarding, rebound, hernias, masses or organomegaly.  Lymphatics: Unremarkable.  Musculoskeletal: Full ROM all peripheral extremities, joint stability, 5/5 strength and normal gait.  Skin: Warm, dry without exposed rashes, lesions or ecchymosis apparent.  Neuro: Cranial nerves intact, reflexes equal bilaterally. Sensory-motor testing grossly intact. Tendon reflexes grossly intact.  Pysch: Alert & oriented x 3.  Insight and judgement nl & appropriate. No ideations.  Assessment and Plan:  1. Labile hypertension  - Continue medication, monitor blood pressure at home.  - Continue DASH diet.  Reminder to go to the ER if any CP,  SOB, nausea, dizziness, severe HA, changes vision/speech.  - CBC with Differential/Platelet - COMPLETE METABOLIC PANEL WITH GFR - Magnesium  2. Hyperlipidemia, mixed  - Continue diet/meds, exercise,& lifestyle modifications.  - Continue monitor periodic cholesterol/liver & renal functions   - Lipid panel - TSH  3. Abnormal glucose  - Hemoglobin A1c -  Insulin, random  4. Vitamin D deficiency  - Continue diet, exercise  - Lifestyle modifications.  - Monitor appropriate labs.  - Continue supplementation.  - VITAMIN D 25 Hydroxy   5. Medication management  - CBC with Differential/Platelet - COMPLETE METABOLIC PANEL WITH GFR - Magnesium - Lipid panel - TSH - Hemoglobin A1c - Insulin, random - VITAMIN D 25 Hydroxy          Discussed  regular exercise, BP monitoring, weight control to achieve/maintain BMI less than 25 and discussed med and SE's. Recommended labs to assess and monitor clinical status with further disposition pending results of labs.  I discussed the assessment and treatment plan with the patient. The patient was provided an opportunity to ask questions and all were answered. The patient agreed with the plan and demonstrated an understanding of the instructions.  I provided over 30 minutes of exam, counseling, chart review and  complex critical decision making.   , MD

## 2020-04-19 NOTE — Patient Instructions (Signed)

## 2020-04-20 LAB — COMPLETE METABOLIC PANEL WITH GFR
AG Ratio: 2 (calc) (ref 1.0–2.5)
ALT: 27 U/L (ref 9–46)
AST: 25 U/L (ref 10–35)
Albumin: 4.5 g/dL (ref 3.6–5.1)
Alkaline phosphatase (APISO): 83 U/L (ref 35–144)
BUN: 18 mg/dL (ref 7–25)
CO2: 30 mmol/L (ref 20–32)
Calcium: 10.4 mg/dL — ABNORMAL HIGH (ref 8.6–10.3)
Chloride: 103 mmol/L (ref 98–110)
Creat: 1.13 mg/dL (ref 0.70–1.25)
GFR, Est African American: 76 mL/min/{1.73_m2} (ref >=60)
GFR, Est Non African American: 66 mL/min/{1.73_m2} (ref >=60)
Globulin: 2.3 g/dL (calc) (ref 1.9–3.7)
Glucose, Bld: 107 mg/dL — ABNORMAL HIGH (ref 65–99)
Potassium: 4.4 mmol/L (ref 3.5–5.3)
Sodium: 140 mmol/L (ref 135–146)
Total Bilirubin: 0.5 mg/dL (ref 0.2–1.2)
Total Protein: 6.8 g/dL (ref 6.1–8.1)

## 2020-04-20 LAB — CBC WITH DIFFERENTIAL/PLATELET
Absolute Monocytes: 701 cells/uL (ref 200–950)
Basophils Absolute: 62 cells/uL (ref 0–200)
Basophils Relative: 0.8 %
Eosinophils Absolute: 177 cells/uL (ref 15–500)
Eosinophils Relative: 2.3 %
HCT: 48.3 % (ref 38.5–50.0)
Hemoglobin: 16.8 g/dL (ref 13.2–17.1)
Lymphs Abs: 2610 cells/uL (ref 850–3900)
MCH: 31.4 pg (ref 27.0–33.0)
MCHC: 34.8 g/dL (ref 32.0–36.0)
MCV: 90.3 fL (ref 80.0–100.0)
MPV: 11.3 fL (ref 7.5–12.5)
Monocytes Relative: 9.1 %
Neutro Abs: 4150 cells/uL (ref 1500–7800)
Neutrophils Relative %: 53.9 %
Platelets: 223 10*3/uL (ref 140–400)
RBC: 5.35 10*6/uL (ref 4.20–5.80)
RDW: 12.4 % (ref 11.0–15.0)
Total Lymphocyte: 33.9 %
WBC: 7.7 10*3/uL (ref 3.8–10.8)

## 2020-04-20 LAB — LIPID PANEL
Cholesterol: 157 mg/dL (ref <200)
HDL: 37 mg/dL — ABNORMAL LOW (ref >=40)
LDL Cholesterol (Calc): 91 mg/dL (calc)
Non-HDL Cholesterol (Calc): 120 mg/dL (calc) (ref <130)
Total CHOL/HDL Ratio: 4.2 (calc) (ref <5.0)
Triglycerides: 203 mg/dL — ABNORMAL HIGH (ref <150)

## 2020-04-20 LAB — VITAMIN D 25 HYDROXY (VIT D DEFICIENCY, FRACTURES): Vit D, 25-Hydroxy: 72 ng/mL (ref 30–100)

## 2020-04-20 LAB — HEMOGLOBIN A1C
Hgb A1c MFr Bld: 5.2 % of total Hgb (ref <5.7)
Mean Plasma Glucose: 103 mg/dL
eAG (mmol/L): 5.7 mmol/L

## 2020-04-20 LAB — TSH: TSH: 3.04 mIU/L (ref 0.40–4.50)

## 2020-04-20 LAB — INSULIN, RANDOM: Insulin: 11.9 u[IU]/mL

## 2020-04-20 LAB — MAGNESIUM: Magnesium: 2.2 mg/dL (ref 1.5–2.5)

## 2020-04-20 NOTE — Progress Notes (Signed)
========================================================== ==========================================================  -    Chol = 157  -  Excellent   - Very low risk for Heart Attack  / Stroke ========================================================  - Triglycerides (   203   ) or fats in blood are too high  (goal is less than 150)    - Recommend avoid fried & greasy foods,  sweets / candy,   - Avoid white rice  (brown or wild rice or Quinoa is OK),   - Avoid white potatoes  (sweet potatoes are OK)   - Avoid anything made from white flour  - bagels, doughnuts, rolls, buns, biscuits, white and   wheat breads, pizza crust and traditional  pasta made of white flour & egg white  - (vegetarian pasta or spinach or wheat pasta is OK).    - Multi-grain bread is OK - like multi-grain flat bread or  sandwich thins.   - Avoid alcohol in excess.   - Exercise is also important. ==========================================================  -  A1c - Normal - No Diabetes - Great   ! ==========================================================  -  Vitamin D = 72 - Excellent  ==========================================================  -  All Else - CBC - Kidneys - Electrolytes - Liver - Magnesium & Thyroid    - all  Normal / OK ==========================================================   -Keep up the [place] Work  ! ==========================================================

## 2020-07-17 ENCOUNTER — Other Ambulatory Visit: Payer: Self-pay | Admitting: Internal Medicine

## 2020-07-17 MED ORDER — DEXAMETHASONE 2 MG PO TABS: ORAL_TABLET | ORAL | 0 refills | Status: DC

## 2020-07-24 ENCOUNTER — Encounter: Payer: Self-pay | Admitting: Adult Health

## 2020-07-24 ENCOUNTER — Other Ambulatory Visit: Payer: Self-pay

## 2020-07-24 ENCOUNTER — Ambulatory Visit (INDEPENDENT_AMBULATORY_CARE_PROVIDER_SITE_OTHER): Payer: BC Managed Care – PPO | Admitting: Adult Health

## 2020-07-24 VITALS — BP 112/60 | HR 91 | Temp 97.5°F | Wt 201.0 lb

## 2020-07-24 DIAGNOSIS — J302 Other seasonal allergic rhinitis: Secondary | ICD-10-CM

## 2020-07-24 DIAGNOSIS — J209 Acute bronchitis, unspecified: Secondary | ICD-10-CM

## 2020-07-24 DIAGNOSIS — R0989 Other specified symptoms and signs involving the circulatory and respiratory systems: Secondary | ICD-10-CM

## 2020-07-24 DIAGNOSIS — Z1152 Encounter for screening for COVID-19: Secondary | ICD-10-CM | POA: Diagnosis not present

## 2020-07-24 LAB — POC COVID19 BINAXNOW: SARS Coronavirus 2 Ag: NEGATIVE

## 2020-07-24 MED ORDER — PREDNISONE 20 MG PO TABS: ORAL_TABLET | ORAL | 0 refills | Status: AC

## 2020-07-24 MED ORDER — AZITHROMYCIN 250 MG PO TABS: ORAL_TABLET | ORAL | 1 refills | Status: AC

## 2020-07-24 MED ORDER — BENZONATATE 200 MG PO CAPS: ORAL_CAPSULE | ORAL | 1 refills | Status: DC

## 2020-07-24 NOTE — Patient Instructions (Signed)
Start zpak, prednisone  Try tessalon/benzonatate three times a day as needed for cough  Do breztri inhaler 1 puff twice daily - rinse mouth afterwards - this will help reduce inflammation, irritation and secretions in lungs - rinse mouth/gargle/brush teeth after each use - let me know if this seems to hepl  Please get chest xray after appointment today at [address] imaging center    Acute Bronchitis, Adult  Acute bronchitis is sudden or acute swelling of the air tubes (bronchi) in the lungs. Acute bronchitis causes these tubes to fill with mucus, which can make it hard to breathe. It can also cause coughing or wheezing. In adults, acute bronchitis usually goes away within 2 weeks. A cough caused by bronchitis may last up to 3 weeks. Smoking, allergies, and asthma can make the condition worse. What are the causes? This condition can be caused by germs and by substances that irritate the lungs, including:  Cold and flu viruses. The most common cause of this condition is the virus that causes the common cold.  Bacteria.  Substances that irritate the lungs, including: ? Smoke from cigarettes and other forms of tobacco. ? Dust and pollen. ? Fumes from chemical products, gases, or burned fuel. ? Other materials that pollute indoor or outdoor air.  Close contact with someone who has acute bronchitis. What increases the risk? The following factors may make you more likely to develop this condition:  A weak body's defense system, also called the immune system.  A condition that affects your lungs and breathing, such as asthma. What are the signs or symptoms? Common symptoms of this condition include:  Lung and breathing problems, such as: ? Coughing. This may bring up clear, yellow, or green mucus from your lungs (sputum). ? Wheezing. ? Having too much mucus in your lungs (chest congestion). ? Having shortness of breath.  A fever.  Chills.  Aches and pains,  including: ? Tightness in your chest and other body aches. ? A sore throat. How is this diagnosed? This condition is usually diagnosed based on:  Your symptoms and medical history.  A physical exam. You may also have other tests, including tests to rule out other conditions, such pneumonia. These tests include:  A test of lung function.  Test of a mucus sample to look for the presence of bacteria.  Tests to check the oxygen level in your blood.  Blood tests.  Chest X-ray. How is this treated? Most cases of acute bronchitis clear up over time without treatment. Your health care provider may recommend:  Drinking more fluids. This can thin your mucus, which may improve your breathing.  Taking a medicine for a fever or cough.  Using a device that gets medicine into your lungs (inhaler) to help improve breathing and control coughing.  Using a vaporizer or a humidifier. These are machines that add water to the air to help you breathe better. Follow these instructions at home: Activity  Get plenty of rest.  Return to your normal activities as told by your health care provider. Ask your health care provider what activities are safe for you. Lifestyle  Drink enough fluid to keep your urine pale yellow.  Do not drink alcohol.  Do not use any products that contain nicotine or tobacco, such as cigarettes, e-cigarettes, and chewing tobacco. If you need help quitting, ask your health care provider. Be aware that: ? Your bronchitis will get worse if you smoke or breathe in other people's smoke (  secondhand smoke). ? Your lungs will heal faster if you quit smoking. General instructions  Take over-the-counter and prescription medicines only as told by your health care provider.  Use an inhaler, vaporizer, or humidifier as told by your health care provider.  If you have a sore throat, gargle with a salt-water mixture 3-4 times a day or as needed. To make a salt-water mixture,  completely dissolve -1 tsp (3-6 g) of salt in 1 cup (237 mL) of warm water.  Keep all follow-up visits as told by your health care provider. This is important.   How is this prevented? To lower your risk of getting this condition again:  Wash your hands often with soap and water. If soap and water are not available, use hand sanitizer.  Avoid contact with people who have cold symptoms.  Try not to touch your mouth, nose, or eyes with your hands.  Avoid places where there are fumes from chemicals. Breathing these fumes will make your condition worse.  Get the flu shot every year.   Contact a health care provider if:  Your symptoms do not improve after 2 weeks of treatment.  You vomit more than once or twice.  You have symptoms of dehydration such as: ? Dark urine. ? Dry skin or eyes. ? Increased thirst. ? Headaches. ? Confusion. ? Muscle cramps. Get help right away if you:  Cough up blood.  Feel pain in your chest.  Have severe shortness of breath.  Faint or keep feeling like you are going to faint.  Have a severe headache.  Have fever or chills that get worse. These symptoms may represent a serious problem that is an emergency. Do not wait to see if the symptoms will go away. Get medical help right away. Call your local emergency services (911 in the [place] Do not drive yourself to the hospital. Summary  Acute bronchitis is sudden (acute) inflammation of the air tubes (bronchi) between the windpipe and the lungs. In adults, acute bronchitis usually goes away within 2 weeks, although coughing may last 3 weeks or longer  Take over-the-counter and prescription medicines only as told by your health care provider.  Drink enough fluid to keep your urine pale yellow.  Contact a health care provider if your symptoms do not improve after 2 weeks of treatment.  Get help right away if you cough up blood, faint, or have chest pain or shortness of breath. This information is  not intended to replace advice given to you by your health care provider. Make sure you discuss any questions you have with your health care provider. Document Revised: 01/11/2019 Document Reviewed: 11/20/2018 Elsevier Patient Education  2021 [employer].

## 2020-07-24 NOTE — Progress Notes (Signed)
Assessment and Plan:  Perry Evans was seen today for acute visit.  Diagnoses and all orders for this visit:  Acute bronchitis, unspecified organism Recurrent bronchitis, sounds like hx of similar in response to onset of allergy sx, abnormal lung sounds will r/o CAP ? Allergic/asthmatic bronchitis element  Continue allergy pill, given duoneb in office with improvement Given breztri sample with instructions x 2 weeks  Next year consider holding inhaler and start with onset of sx Avoid allergy triggers Follow up PRN if not improving -     predniSONE (DELTASONE) 20 MG tablet; 3 tablets daily with food for 3 days, 2 tabs daily for 3 days, 1 tab a day for 5 days. -     azithromycin (ZITHROMAX) 250 MG tablet; Take 2 tablets (500 mg) on  Day 1,  followed by 1 tablet (250 mg) once daily on Days 2 through 5. -     benzonatate (TESSALON) 200 MG capsule; Take 1 tab three times a day as needed for cough.  Encounter for screening for COVID-19 -     POC COVID-19 - negative   Seasonal allergies - continue Allegra OTC, allergy hygiene explained. - try astalin/azelastine for rhinitis   Abnormal lung sounds -     DG Chest 2 View; Future  Further disposition pending results of labs. Discussed med's effects and SE's.   Over 30 minutes of exam, counseling, chart review, and critical decision making was performed.   Future Appointments  Date Time Provider Department Center  11/08/2020  2:00 PM Lucky Cowboy, MD [facility] None    ------------------------------------------------------------------------------------------------------------------   HPI BP 112/60   Pulse 91   Temp (!) 97.5 F (36.4 C)   Wt 201 lb (91.2 kg)   SpO2 96%   BMI 28.03 kg/m   69 y.o.male remote smoker (quit 2000) presents for evaluation due to 1 week of upper respiratory symptoms. He did have negative covid 19 test today. Reports hx of allergies triggering cough that responds well to zpak/steroid taper in the past,  typically around this time of year with Spring allergies.   He reports 1 week ago days ago he experienced abrupt onset of watery itchy eyes, cough, rhinitis, post-nasal drip. Denies fever/chills. Dr. Oneta Rack sent in dexamethasone taper which worked well, but sx immediately began again the day after completing. Also now with wheezing/tightness in chest, more productive cough.   He reports has tried promethazine DM with limited benefit, uses cough drops with benefit.   He reports has been taking fexofenadine daily, this has worked well in the past, started taking back in Feb when he noted pollen.   He has tried numerous other allergy pills without benefit, feels current agent has worked the best. Has tried singulaire.   Has never tried inhalers. Last CXR 05/2019 showed Low lung volumes with some streaky basilar atelectatic changes   Past Medical History:  Diagnosis Date  . Arthritis   . Cataract   . Hearing aid worn      Allergies  Allergen Reactions  . Codeine     Current Outpatient Medications on File Prior to Visit  Medication Sig  . Ascorbic Acid (VITAMIN C ADULT GUMMIES PO) Take 250 mg by mouth. Takes 500 mg  . Cholecalciferol (VITAMIN D PO) Take 10,000 Units by mouth daily.   Marland Kitchen CINNAMON PO Take 1 capsule by mouth daily.  . Multiple Vitamins-Minerals (MULTIVITAMIN GUMMIES MENS PO) Take by mouth daily.  Marland Kitchen OVER THE COUNTER MEDICATION Uses OTC Flonase NS PRN  . OVER  THE COUNTER MEDICATION OTC allergy tablet daily.  . sildenafil (VIAGRA) 100 MG tablet Take     1/2 to 1 tablet     Daily       as needed for XXXX  . TURMERIC PO Take 1 tablet by mouth daily.  Marland Kitchen zinc gluconate 50 MG tablet Take 50 mg by mouth daily.  Marland Kitchen aspirin 81 MG tablet Take 81 mg by mouth daily. (Patient not taking: No sig reported)  . finasteride (PROSCAR) 5 MG tablet Take 1 tablet Daily for prostate (Patient not taking: No sig reported)  . tamsulosin (FLOMAX) 0.4 MG CAPS capsule Take 1 tablet Daily for Prostate  (Patient not taking: No sig reported)   No current facility-administered medications on file prior to visit.    ROS: all negative except above.   Physical Exam:  BP 112/60   Pulse 91   Temp (!) 97.5 F (36.4 C)   Wt 201 lb (91.2 kg)   SpO2 96%   BMI 28.03 kg/m   General Appearance: Well nourished, in no apparent distress. Eyes: PERRLA, EOMs, conjunctiva no swelling or erythema Sinuses: No Frontal/maxillary tenderness ENT/Mouth: Ext aud canals clear, TMs without erythema, bulging. No erythema, swelling, or exudate on post pharynx.  Tonsils not swollen or erythematous. Hearing normal.  Neck: Supple, thyroid normal.  Respiratory: Respiratory effort normal, BS with generalized rales and rhonchi, does clear some with cough, without wheezing or stridor. Hacking bronchitic cough heard in office.  Cardio: RRR with no MRGs. Brisk peripheral pulses without edema.  Abdomen: Soft, + BS.  Non tender, no guarding, rebound, hernias, masses. Lymphatics: Non tender without lymphadenopathy.  Musculoskeletal:  normal gait.  Skin: Warm, dry without rashes, lesions, ecchymosis.  Neuro: Cranial nerves intact. Normal muscle tone Psych: Awake and oriented X 3, normal affect, Insight and Judgment appropriate.     Dan Maker, NP 5:28 PM [facility]

## 2020-09-22 ENCOUNTER — Telehealth: Payer: Self-pay

## 2020-09-22 NOTE — Telephone Encounter (Signed)
Patient states that symptoms he is having pressure in his head and face, stuffy nose, can't breath too well, possible fever, going on for x 2 days. Tested negative for Covid. Has taken Mucinex without any relief.

## 2020-11-08 ENCOUNTER — Encounter: Payer: Self-pay | Admitting: Internal Medicine

## 2020-11-08 ENCOUNTER — Ambulatory Visit (INDEPENDENT_AMBULATORY_CARE_PROVIDER_SITE_OTHER): Payer: BC Managed Care – PPO | Admitting: Internal Medicine

## 2020-11-08 ENCOUNTER — Other Ambulatory Visit: Payer: Self-pay

## 2020-11-08 VITALS — BP 124/76 | HR 75 | Temp 97.9°F | Resp 16 | Ht 69.0 in | Wt 205.0 lb

## 2020-11-08 DIAGNOSIS — Z0000 Encounter for general adult medical examination without abnormal findings: Secondary | ICD-10-CM

## 2020-11-08 DIAGNOSIS — R350 Frequency of micturition: Secondary | ICD-10-CM | POA: Diagnosis not present

## 2020-11-08 DIAGNOSIS — N401 Enlarged prostate with lower urinary tract symptoms: Secondary | ICD-10-CM

## 2020-11-08 DIAGNOSIS — Z1329 Encounter for screening for other suspected endocrine disorder: Secondary | ICD-10-CM | POA: Diagnosis not present

## 2020-11-08 DIAGNOSIS — E559 Vitamin D deficiency, unspecified: Secondary | ICD-10-CM

## 2020-11-08 DIAGNOSIS — Z8249 Family history of ischemic heart disease and other diseases of the circulatory system: Secondary | ICD-10-CM

## 2020-11-08 DIAGNOSIS — Z136 Encounter for screening for cardiovascular disorders: Secondary | ICD-10-CM | POA: Diagnosis not present

## 2020-11-08 DIAGNOSIS — Z131 Encounter for screening for diabetes mellitus: Secondary | ICD-10-CM

## 2020-11-08 DIAGNOSIS — Z1211 Encounter for screening for malignant neoplasm of colon: Secondary | ICD-10-CM

## 2020-11-08 DIAGNOSIS — Z125 Encounter for screening for malignant neoplasm of prostate: Secondary | ICD-10-CM | POA: Diagnosis not present

## 2020-11-08 DIAGNOSIS — Z13220 Encounter for screening for lipoid disorders: Secondary | ICD-10-CM | POA: Diagnosis not present

## 2020-11-08 DIAGNOSIS — N138 Other obstructive and reflux uropathy: Secondary | ICD-10-CM

## 2020-11-08 DIAGNOSIS — R0989 Other specified symptoms and signs involving the circulatory and respiratory systems: Secondary | ICD-10-CM

## 2020-11-08 DIAGNOSIS — Z1389 Encounter for screening for other disorder: Secondary | ICD-10-CM

## 2020-11-08 DIAGNOSIS — Z79899 Other long term (current) drug therapy: Secondary | ICD-10-CM | POA: Diagnosis not present

## 2020-11-08 DIAGNOSIS — R7309 Other abnormal glucose: Secondary | ICD-10-CM

## 2020-11-08 DIAGNOSIS — Z87891 Personal history of nicotine dependence: Secondary | ICD-10-CM

## 2020-11-08 DIAGNOSIS — E782 Mixed hyperlipidemia: Secondary | ICD-10-CM

## 2020-11-08 DIAGNOSIS — Z0001 Encounter for general adult medical examination with abnormal findings: Secondary | ICD-10-CM

## 2020-11-08 NOTE — Progress Notes (Signed)
Annual  Screening/Preventative Visit  & Comprehensive Evaluation & Examination  Future Appointments  Date Time Provider Department Center  11/08/2020  2:00 PM Lucky Cowboy, MD [facility] None  11/08/2021  2:00 PM Lucky Cowboy, MD [facility] None            This very nice 70 y.o. MWM presents for a Screening /Preventative Visit & comprehensive evaluation and management of multiple medical co-morbidities.  Patient has been followed for HTN, HLD, T glucose intolerance and Vitamin D Deficiency.       Labile HTN predates since 2007. Patient's BP has been controlled at home.  Today's BP is at goal - 124/76. Patient denies any cardiac symptoms as chest pain, palpitations, shortness of breath, dizziness or ankle swelling.       Patient's hyperlipidemia is controlled with diet and medications. Patient denies myalgias or other medication SE's. Last lipids were   Lab Results  Component Value Date   CHOL 157 04/19/2020   HDL 37 (L) 04/19/2020   LDLCALC 91 04/19/2020   TRIG 203 (H) 04/19/2020   CHOLHDL 4.2 04/19/2020         Patient is followed expectantly for glucose intolerance  and patient denies reactive hypoglycemic symptoms, visual blurring, diabetic polys or paresthesias. Last A1c was normal & at goal:   Lab Results  Component Value Date   HGBA1C 5.2 04/19/2020          Finally, patient has history of Vitamin D Deficiency ("32" /2010) and last vitamin D was at goal:   Lab Results  Component Value Date   VD25OH 72 04/19/2020     Current Outpatient Medications on File Prior to Visit  Medication Sig   VIT C GUMMIES   Takes 500 mg   benzonatate  200 MG capsule Take 1 tab three times a day as needed    VITAMIN D  10,000 Units  Take daily.    CINNAMON  Take 1 capsule  daily.   MULTIVIT GUMMIES  Take  daily.   OTC Flonase  Uses NS PRN   OTC allergy tablet  daily.   sildenafil 100 MG tablet Take 1/2 to 1 tab Daily as needed    TURMERIC PO Take 1 tablet by mouth  daily.   zinc gluconate 50 MG tablet Take 50 mg by mouth daily.   No current facility-administered medications on file prior to visit.     Allergies  Allergen Reactions   Codeine      Past Medical History:  Diagnosis Date   Arthritis    Cataract    Hearing aid worn      Health Maintenance  Topic Date Due   COVID-19 Vaccine (1) Never done   Zoster Vaccines- Shingrix (1 of 2) Never done   PNA vac  (2 of 2 - PPSV23) 11/02/2019   INFLUENZA VACCINE  12/11/2020   Fecal DNA (Cologuard)  11/23/2021   TETANUS/TDAP  11/01/2028   Hepatitis C Screening  Completed   HPV VACCINES  Aged Out     Immunization History  Administered Date(s) Administered   DT (Pediatric) 05/13/1996   Hepatitis A 05/13/2009   Influenza, High Dose  02/23/2020   Influenza 02/10/2018   PPD Test 09/16/2016   Pneumococcal -13 11/02/2018   Td 11/02/2018   Tdap 08/26/2008    Last Colon - 06/06/2005 - Dr Arlyce Dice and he's aware that he's over due 10 yr recall. He was sent a 10 year recall letter in 2017 by Dr Myrtie Neither.  Past Surgical History:  Procedure Laterality Date   EYE SURGERY     PANCREAS SURGERY       Family History  Problem Relation Age of Onset   Heart disease Father 42       killed   Heart disease Paternal Aunt 13   Heart disease Paternal Uncle        3 uncles all died 79-70   Heart disease Paternal Grandfather 8       MI died    Social History   Socioeconomic History   Marital status: Married    Spouse name: Not on file   Number of children: Not on file   Years of education: Not on file   Highest education level: Not on file  Occupational History   Not on file  Tobacco Use   Smoking status: Former    Pack years: 0.00    Types: Cigarettes    Quit date: 05/13/1998    Years since quitting: 22.5   Smokeless tobacco: Never  Vaping Use   Vaping Use: Never used  Substance and Sexual Activity   Alcohol use: No   Drug use: No   Sexual activity: Not on file  Other Topics  Concern   Not on file  Social History Narrative   Employment: works in Teacher, English as a foreign language for [employer].   Social Determinants of Health   Financial Resource Strain: Not on file  Food Insecurity: Not on file  Transportation Needs: Not on file  Physical Activity: Not on file  Stress: Not on file  Social Connections: Not on file  Intimate Partner Violence: Not on file     ROS Constitutional: Denies fever, chills, weight loss/gain, headaches, insomnia,  night sweats or change in appetite. Does c/o fatigue. Eyes: Denies redness, blurred vision, diplopia, discharge, itchy or watery eyes.  ENT: Denies discharge, congestion, post nasal drip, epistaxis, sore throat, earache, hearing loss, dental pain, Tinnitus, Vertigo, Sinus pain or snoring.  Cardio: Denies chest pain, palpitations, irregular heartbeat, syncope, dyspnea, diaphoresis, orthopnea, PND, claudication or edema Respiratory: denies cough, dyspnea, DOE, pleurisy, hoarseness, laryngitis or wheezing.  Gastrointestinal: Denies dysphagia, heartburn, reflux, water brash, pain, cramps, nausea, vomiting, bloating, diarrhea, constipation, hematemesis, melena, hematochezia, jaundice or hemorrhoids Genitourinary: Denies dysuria, frequency, urgency, nocturia, hesitancy, discharge, hematuria or flank pain Musculoskeletal: Denies arthralgia, myalgia, stiffness, Jt. Swelling, pain, limp or strain/sprain. Denies Falls. Skin: Denies puritis, rash, hives, warts, acne, eczema or change in skin lesion Neuro: No weakness, tremor, incoordination, spasms, paresthesia or pain Psychiatric: Denies confusion, memory loss or sensory loss. Denies Depression. Endocrine: Denies change in weight, skin, hair change, nocturia, and paresthesia, diabetic polys, visual blurring or hyper / hypo glycemic episodes.  Heme/Lymph: No excessive bleeding, bruising or enlarged lymph nodes.   Physical Exam  BP 124/76   Pulse 75   Temp 97.9 F (36.6 C)    Resp 16   Ht 5\' 9"  (1.753 m)   Wt 205 lb (93 kg)   SpO2 96%   BMI 30.27 kg/m   General Appearance: Well nourished and well groomed and in no apparent distress.  Eyes: PERRLA, EOMs, conjunctiva no swelling or erythema, normal fundi and vessels. Sinuses: No frontal/maxillary tenderness ENT/Mouth: EACs patent / TMs  nl. Nares clear without erythema, swelling, mucoid exudates. Oral hygiene is good. No erythema, swelling, or exudate. Tongue normal, non-obstructing. Tonsils not swollen or erythematous. Hearing normal.  Neck: Supple, thyroid not palpable. No bruits, nodes or JVD. Respiratory: Respiratory effort normal.  BS equal and  clear bilateral without rales, rhonci, wheezing or stridor. Cardio: Heart sounds are normal with regular rate and rhythm and no murmurs, rubs or gallops. Peripheral pulses are normal and equal bilaterally without edema. No aortic or femoral bruits. Chest: symmetric with normal excursions and percussion.  Abdomen: Soft, with Nl bowel sounds. Nontender, no guarding, rebound, hernias, masses, or organomegaly.  Lymphatics: Non tender without lymphadenopathy.  Musculoskeletal: Full ROM all peripheral extremities, joint stability, 5/5 strength, and normal gait. Skin: Warm and dry without rashes, lesions, cyanosis, clubbing or  ecchymosis.  Neuro: Cranial nerves intact, reflexes equal bilaterally. Normal muscle tone, no cerebellar symptoms. Sensation intact.  Pysch: Alert and oriented X 3 with normal affect, insight and judgment appropriate.   Assessment and Plan  1. Annual Preventative/Screening Exam    2. Labile hypertension  - EKG 12-Lead - [place], RETROPERITNL ABD,  LTD - Urinalysis, Routine w reflex microscopic - Microalbumin / creatinine urine ratio - CBC with Differential/Platelet - COMPLETE METABOLIC PANEL WITH GFR - Magnesium  3. Hyperlipidemia, mixed - EKG 12-Lead - [place], RETROPERITNL ABD,  LTD - Lipid panel  4. Abnormal glucose  - EKG 12-Lead - [place],  RETROPERITNL ABD,  LTD - Hemoglobin A1c - Insulin, random  5. Vitamin D deficiency  - VITAMIN D 25 Hydroxy   6. BPH with obstruction/lower urinary tract symptoms  - PSA  7. Screening for colorectal cancer  - POC Hemoccult Bld/Stl   8. Prostate cancer screening  - PSA  9. Screening for ischemic heart disease  - EKG 12-Lead  10. FHx: heart disease  - EKG 12-Lead - [place], RETROPERITNL ABD,  LTD  11. Former smoker  - EKG 12-Lead - [place], RETROPERITNL ABD,  LTD  12. Screening for AAA (abdominal aortic aneurysm)  - [place], RETROPERITNL ABD,  LTD  13. Medication management  - CBC with Differential/Platelet - COMPLETE METABOLIC PANEL WITH GFR - Magnesium - Lipid panel - TSH - Hemoglobin A1c - Insulin, random - VITAMIN D 25 Hydroxy          Patient was counseled in prudent diet, weight control to achieve/maintain BMI less than 25, BP monitoring, regular exercise and medications as discussed.  Discussed med effects and SE's. Routine screening labs and tests as requested with regular follow-up as recommended. Over 40 minutes of exam, counseling, chart review and high complex critical decision making was performed   Marinus Maw, MD

## 2020-11-08 NOTE — Patient Instructions (Signed)

## 2020-11-09 LAB — LIPID PANEL
Cholesterol: 143 mg/dL (ref <200)
HDL: 36 mg/dL — ABNORMAL LOW (ref >=40)
LDL Cholesterol (Calc): 75 mg/dL (calc)
Non-HDL Cholesterol (Calc): 107 mg/dL (calc) (ref <130)
Total CHOL/HDL Ratio: 4 (calc) (ref <5.0)
Triglycerides: 218 mg/dL — ABNORMAL HIGH (ref <150)

## 2020-11-09 LAB — COMPLETE METABOLIC PANEL WITH GFR
AG Ratio: 2.4 (calc) (ref 1.0–2.5)
ALT: 24 U/L (ref 9–46)
AST: 22 U/L (ref 10–35)
Albumin: 4.4 g/dL (ref 3.6–5.1)
Alkaline phosphatase (APISO): 70 U/L (ref 35–144)
BUN: 16 mg/dL (ref 7–25)
CO2: 32 mmol/L (ref 20–32)
Calcium: 10.7 mg/dL — ABNORMAL HIGH (ref 8.6–10.3)
Chloride: 103 mmol/L (ref 98–110)
Creat: 1 mg/dL (ref 0.70–1.18)
GFR, Est African American: 88 mL/min/{1.73_m2} (ref >=60)
GFR, Est Non African American: 76 mL/min/{1.73_m2} (ref >=60)
Globulin: 1.8 g/dL (calc) — ABNORMAL LOW (ref 1.9–3.7)
Glucose, Bld: 148 mg/dL — ABNORMAL HIGH (ref 65–99)
Potassium: 4.4 mmol/L (ref 3.5–5.3)
Sodium: 139 mmol/L (ref 135–146)
Total Bilirubin: 0.7 mg/dL (ref 0.2–1.2)
Total Protein: 6.2 g/dL (ref 6.1–8.1)

## 2020-11-09 LAB — CBC WITH DIFFERENTIAL/PLATELET
Absolute Monocytes: 638 cells/uL (ref 200–950)
Basophils Absolute: 53 cells/uL (ref 0–200)
Basophils Relative: 0.7 %
Eosinophils Absolute: 203 cells/uL (ref 15–500)
Eosinophils Relative: 2.7 %
HCT: 46.2 % (ref 38.5–50.0)
Hemoglobin: 15.6 g/dL (ref 13.2–17.1)
Lymphs Abs: 2235 cells/uL (ref 850–3900)
MCH: 31.1 pg (ref 27.0–33.0)
MCHC: 33.8 g/dL (ref 32.0–36.0)
MCV: 92 fL (ref 80.0–100.0)
MPV: 11.4 fL (ref 7.5–12.5)
Monocytes Relative: 8.5 %
Neutro Abs: 4373 cells/uL (ref 1500–7800)
Neutrophils Relative %: 58.3 %
Platelets: 228 10*3/uL (ref 140–400)
RBC: 5.02 10*6/uL (ref 4.20–5.80)
RDW: 12.7 % (ref 11.0–15.0)
Total Lymphocyte: 29.8 %
WBC: 7.5 10*3/uL (ref 3.8–10.8)

## 2020-11-09 LAB — URINALYSIS, ROUTINE W REFLEX MICROSCOPIC
Bilirubin Urine: NEGATIVE
Glucose, UA: NEGATIVE
Hgb urine dipstick: NEGATIVE
Ketones, ur: NEGATIVE
Leukocytes,Ua: NEGATIVE
Nitrite: NEGATIVE
Protein, ur: NEGATIVE
Specific Gravity, Urine: 1.023 (ref 1.001–1.035)
pH: 5.5 (ref 5.0–8.0)

## 2020-11-09 LAB — HEMOGLOBIN A1C
Hgb A1c MFr Bld: 5.1 % of total Hgb (ref <5.7)
Mean Plasma Glucose: 100 mg/dL
eAG (mmol/L): 5.5 mmol/L

## 2020-11-09 LAB — PSA: PSA: 0.49 ng/mL (ref <=4.00)

## 2020-11-09 LAB — MICROALBUMIN / CREATININE URINE RATIO
Creatinine, Urine: 96 mg/dL (ref 20–320)
Microalb Creat Ratio: 2 mcg/mg creat (ref <30)
Microalb, Ur: 0.2 mg/dL

## 2020-11-09 LAB — TSH: TSH: 2.1 mIU/L (ref 0.40–4.50)

## 2020-11-09 LAB — VITAMIN D 25 HYDROXY (VIT D DEFICIENCY, FRACTURES): Vit D, 25-Hydroxy: 72 ng/mL (ref 30–100)

## 2020-11-09 LAB — MAGNESIUM: Magnesium: 2.1 mg/dL (ref 1.5–2.5)

## 2020-11-09 LAB — INSULIN, RANDOM: Insulin: 18.4 u[IU]/mL

## 2020-11-09 NOTE — Progress Notes (Signed)
============================================================ ============================================================  -    PSA - Low - Great  ============================================================ ============================================================  - Glucose was up a little, But A1c - Normal & OK - No Diabetes ============================================================ ============================================================  - Total Chol = 143   and LDL Chol = 75 - Both Excellent,   - But Triglycerides (    218    ) or fats in blood are too high  (goal is less than 150)    - Recommend avoid fried & greasy foods,  sweets / candy,   - Avoid white rice  (brown or wild rice or Quinoa is OK),   - Avoid white potatoes  (sweet potatoes are OK)   - Avoid anything made from white flour  - bagels, doughnuts, rolls, buns, biscuits, white and   wheat breads, pizza crust and traditional  pasta made of white flour & egg white  - (vegetarian pasta or spinach or wheat pasta is OK).    - Multi-grain bread is OK - like multi-grain flat bread or  sandwich thins.   - Avoid alcohol in excess.   - Exercise is also important. ============================================================ ============================================================  - Vitamin D =  72   -  Excellent  ============================================================ ============================================================  - All Else - CBC - Kidneys - U/A - Electrolytes - Liver - Magnesium & Thyroid    - all  Normal / OK ============================================================ ============================================================  - Keep up the [place] Work  !  ============================================================ ============================================================

## 2020-11-10 NOTE — Progress Notes (Signed)
Pt was called to discuss his lab results. Pt did no answer the phone a VM was left to give office a call on 11/14/20. DG/CCMA

## 2020-11-12 ENCOUNTER — Encounter: Payer: Self-pay | Admitting: Internal Medicine

## 2020-12-08 DIAGNOSIS — H6123 Impacted cerumen, bilateral: Secondary | ICD-10-CM | POA: Insufficient documentation

## 2020-12-08 DIAGNOSIS — H903 Sensorineural hearing loss, bilateral: Secondary | ICD-10-CM | POA: Insufficient documentation

## 2020-12-17 IMAGING — CR DG CHEST 2V
2 series · 2 of 2 positions shown · non-contrast
Comparison: Radiograph March 07, 2005, scapular radiograph September 10, 2017

CLINICAL DATA: Elevated calcium, history of smoking, hypertension

EXAM:
CHEST - 2 VIEW

[w chest pa]
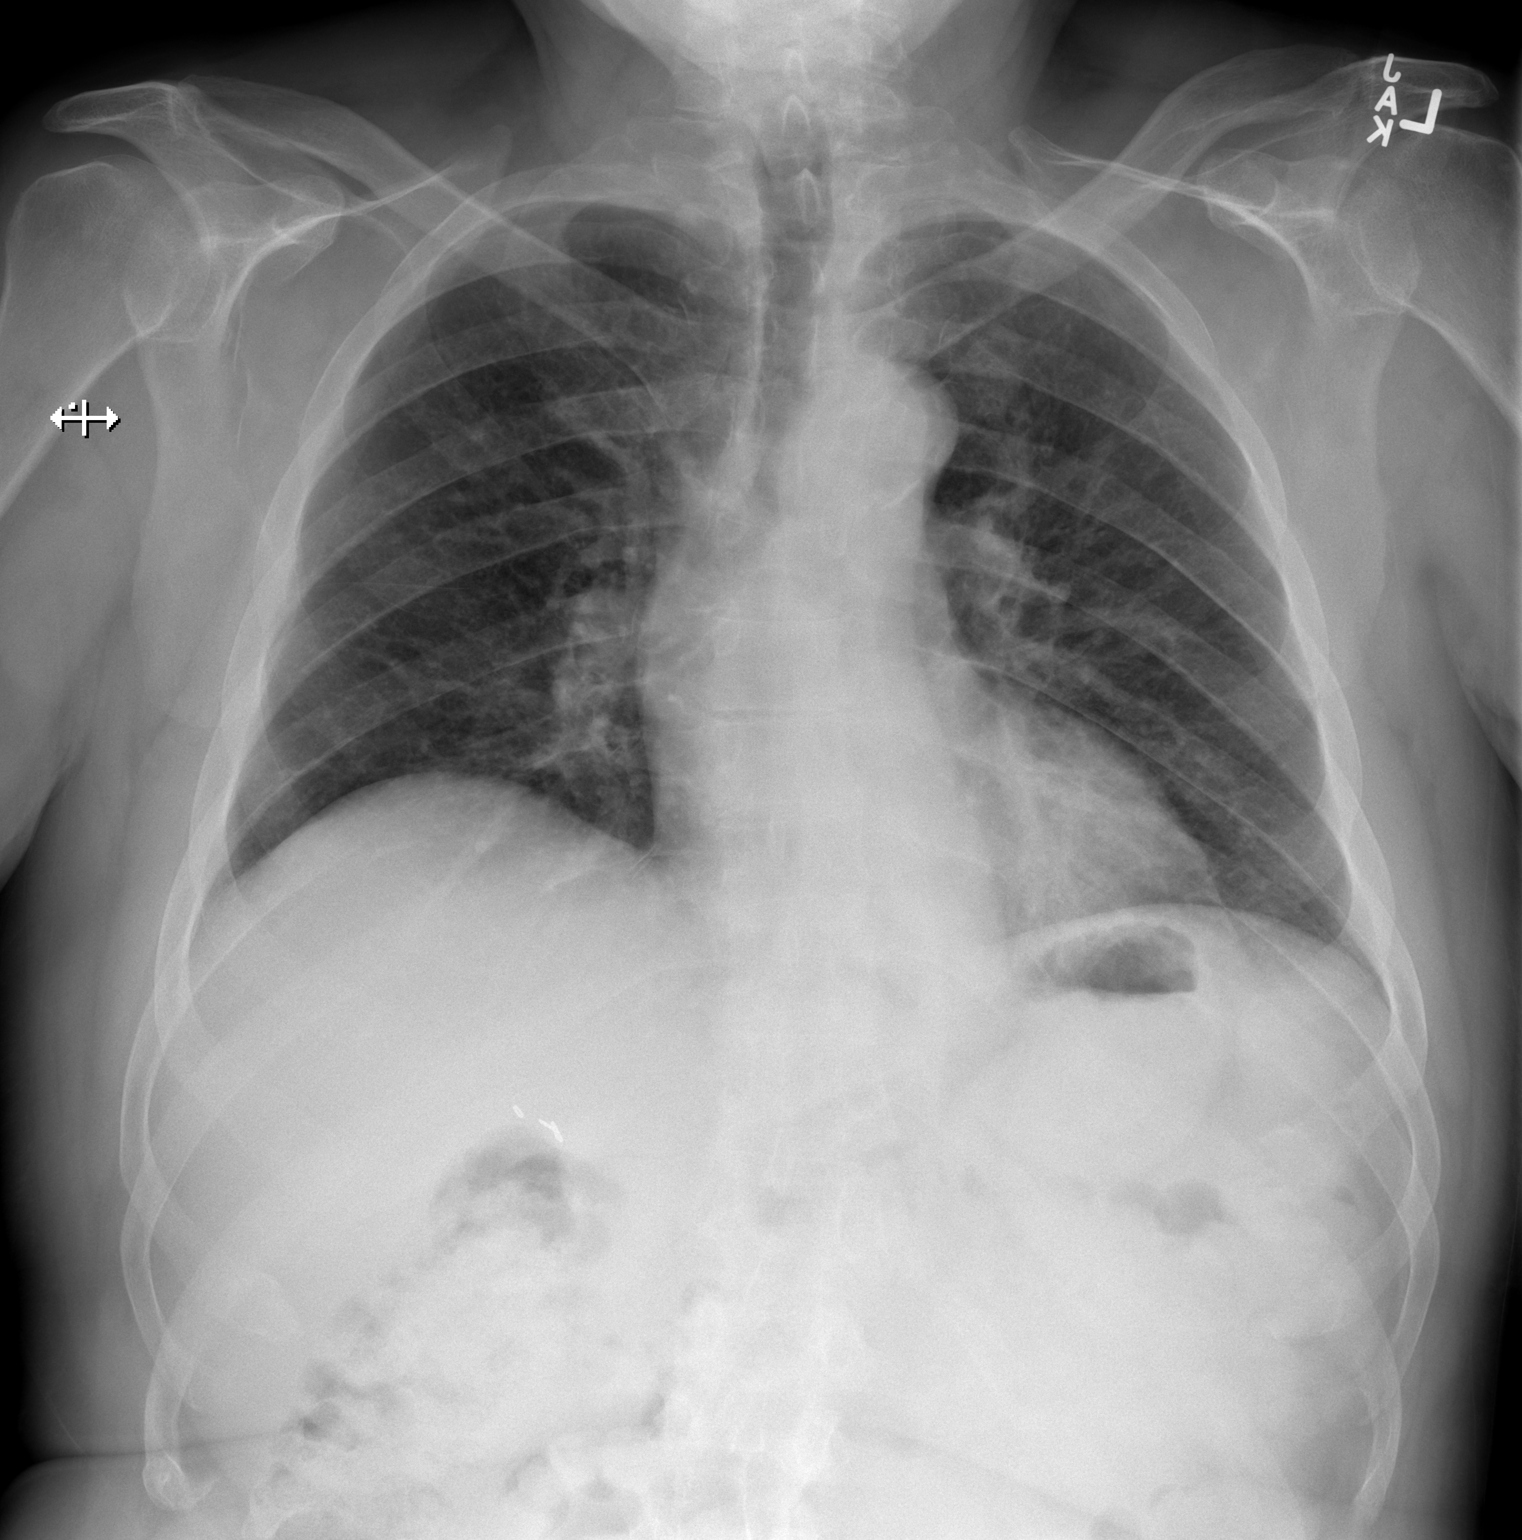

[w chest lat]
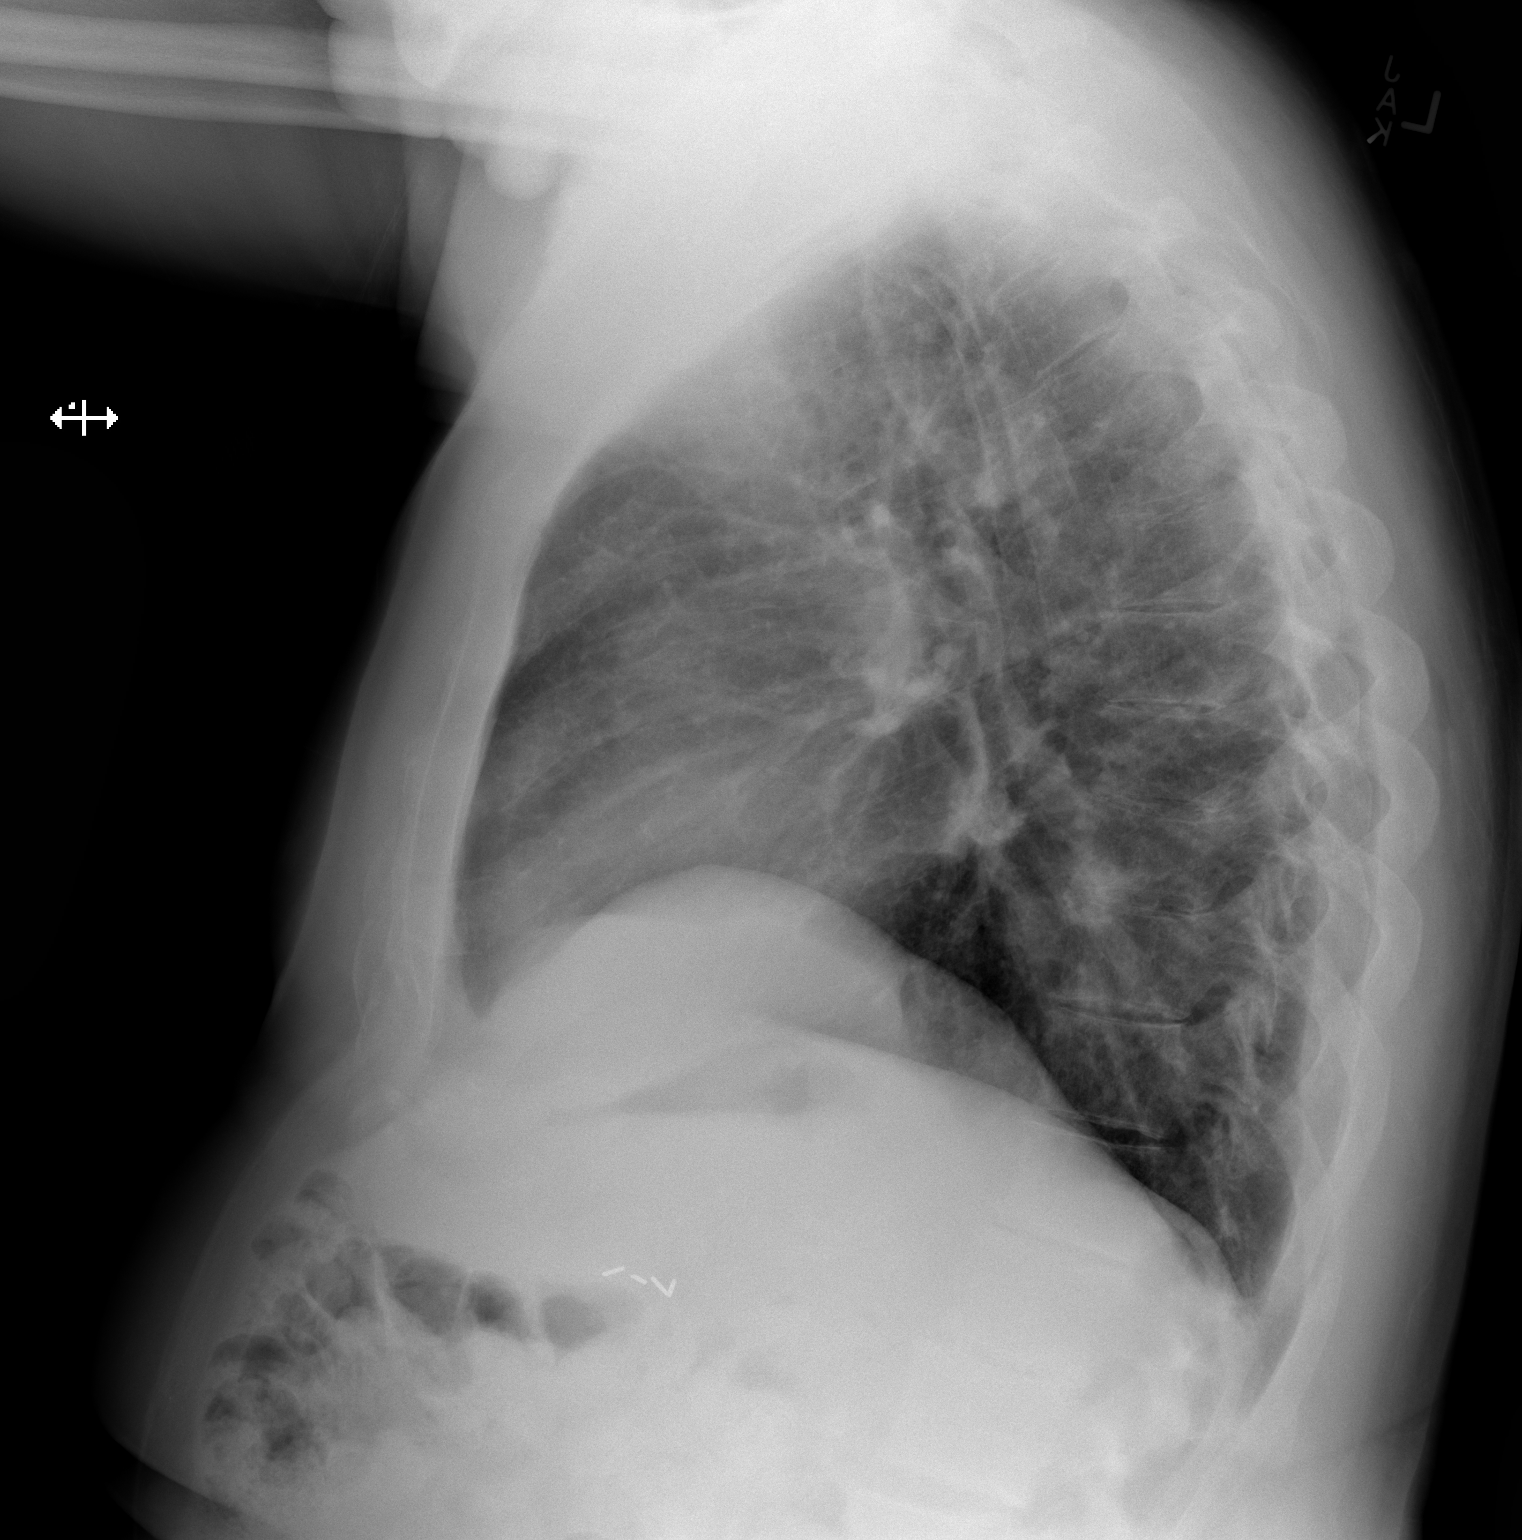

[2 of 2 positions shown; findings below may reference images not displayed]

FINDINGS: Low lung volumes with some streaky basilar atelectatic changes. No
consolidation, features of edema, pneumothorax, or effusion. The
aorta is calcified. The remaining cardiomediastinal contours are
unremarkable. No acute osseous or soft tissue abnormality.
Degenerative changes are present in the imaged spine and shoulders.
Cholecystectomy clips present in the right upper quadrant.
IMPRESSION: Atelectasis, otherwise no acute cardiopulmonary abnormality.

## 2020-12-19 ENCOUNTER — Other Ambulatory Visit: Payer: Self-pay

## 2020-12-19 ENCOUNTER — Ambulatory Visit: Payer: BC Managed Care – PPO | Admitting: Nurse Practitioner

## 2020-12-19 ENCOUNTER — Encounter: Payer: Self-pay | Admitting: Nurse Practitioner

## 2020-12-19 VITALS — HR 5

## 2020-12-19 DIAGNOSIS — U071 COVID-19: Secondary | ICD-10-CM

## 2020-12-19 MED ORDER — AZITHROMYCIN 250 MG PO TABS: ORAL_TABLET | ORAL | 1 refills | Status: DC

## 2020-12-19 MED ORDER — BENZONATATE 200 MG PO CAPS: ORAL_CAPSULE | ORAL | 1 refills | Status: DC

## 2020-12-19 MED ORDER — DEXAMETHASONE 1 MG PO TABS: ORAL_TABLET | ORAL | 0 refills | Status: DC

## 2020-12-19 NOTE — Progress Notes (Signed)
THIS ENCOUNTER IS A VIRTUAL VISIT DUE TO COVID-19 - PATIENT WAS NOT SEEN IN THE OFFICE.  PATIENT HAS CONSENTED TO VIRTUAL VISIT / TELEMEDICINE VISIT   Virtual Visit via telephone Note  I connected with  Perry Evans on 12/19/2020 by telephone.  I verified that I am speaking with the correct person using two identifiers.    I discussed the limitations of evaluation and management by telemedicine and the availability of in person appointments. The patient expressed understanding and agreed to proceed.  History of Present Illness:  There were no vitals taken for this visit. 70 y.o. patient contacted office reporting URI sx sore throat, nonproductive coughing, congestion, runny nose of clear drainage . he tested positive by home test yesterday. OV was conducted by telephone to minimize exposure. This patient  was Moderna vaccinated for covid 19, last 2021  Sx began 2 days ago with cough, congestion and sore throat has been getting worse. Had a fever the first day but has resolved.    Treatments tried so far: Sudafed and Aleve.  Has Promethazine cough syrup  Exposures: unknown   Medications   Current Outpatient Medications (Cardiovascular):    sildenafil (VIAGRA) 100 MG tablet, Take     1/2 to 1 tablet     Daily       as needed for XXXX  Current Outpatient Medications (Respiratory):    benzonatate (TESSALON) 200 MG capsule, Take 1 tab three times a day as needed for cough.  Current Outpatient Medications (Analgesics):    aspirin 81 MG tablet, Take 81 mg by mouth daily. (Patient not taking: No sig reported)   Current Outpatient Medications (Other):    Ascorbic Acid (VITAMIN C ADULT GUMMIES PO), Take 250 mg by mouth. Takes 500 mg   Cholecalciferol (VITAMIN D PO), Take 10,000 Units by mouth daily.    CINNAMON PO, Take 1 capsule by mouth daily.   finasteride (PROSCAR) 5 MG tablet, Take 1 tablet Daily for prostate (Patient not taking: No sig reported)   Multiple Vitamins-Minerals  (MULTIVITAMIN GUMMIES MENS PO), Take by mouth daily.   OVER THE COUNTER MEDICATION, Uses OTC Flonase NS PRN   OVER THE COUNTER MEDICATION, OTC allergy tablet daily.   tamsulosin (FLOMAX) 0.4 MG CAPS capsule, Take 1 tablet Daily for Prostate (Patient not taking: No sig reported)   TURMERIC PO, Take 1 tablet by mouth daily.   zinc gluconate 50 MG tablet, Take 50 mg by mouth daily.  Allergies:  Allergies  Allergen Reactions   Codeine     Problem list He has Essential hypertension; Mixed hyperlipidemia; Abnormal glucose; Vitamin D deficiency; Medication management; Aortic atherosclerosis (HCC); Seasonal allergies; and Acute bronchitis on their problem list.   Social History:   reports that he quit smoking about 22 years ago. He has never used smokeless tobacco. He reports that he does not drink alcohol and does not use drugs.  Observations/Objective:  General : Well sounding patient in no apparent distress HEENT: Hoarseness to voice noticed, no cough for duration of visit Lungs: speaks in complete sentences, no audible wheezing, no apparent distress Neurological: alert, oriented x 3 Psychiatric: pleasant, judgement appropriate   Assessment and Plan:  Perry Evans was seen today for covid positive.  Diagnoses and all orders for this visit:  COVID-19 -     azithromycin (ZITHROMAX) 250 MG tablet; Take 2 tablets (500 mg) on  Day 1,  followed by 1 tablet (250 mg) once daily on Days 2 through 5. -  dexamethasone (DECADRON) 1 MG tablet; Take 3 tabs for 3 days, 2 tabs for 3 days 1 tab for 5 days. Take with food. -     benzonatate (TESSALON) 200 MG capsule; Take 1 tab three times a day as needed for cough.       Covid 19 positive per rapid screening test 12/18/20 positive at home Risk factors include: hypertension Symptoms are: mild       Immue support with vitamin D 1000 mg daily, zinc 50 mg daily, vitamin D Reviewed doing regular breathing exercises, proning Take tylenol PRN temp 101+ Push  hydration Regular ambulation or calf exercises exercises for clot prevention and restart 81 mg ASA  Sx supportive therapy suggested Follow up via mychart or telephone if needed Advised patient obtain O2 monitor; present to ED if persistently <88% or with severe dyspnea, CP, fever uncontrolled by tylenol, confusion, sudden decline Should remain in isolation until at least 5 days from onset of sx, 24-48 hours fever free without tylenol, sx such as cough are improved.      Follow Up Instructions:  I discussed the assessment and treatment plan with the patient. The patient was provided an opportunity to ask questions and all were answered. The patient agreed with the plan and demonstrated an understanding of the instructions.   The patient was advised to call back or seek an in-person evaluation if the symptoms worsen or if the condition fails to improve as anticipated.  I provided 20 minutes of non-face-to-face time during this encounter.  Revonda Humphrey ANP-C  [facility]  12/19/2020

## 2021-05-09 NOTE — Progress Notes (Deleted)
FOLLOW UP  Assessment and Plan:   Essential hypertension -     CBC with Diff -     COMPLETE METABOLIC PANEL WITH GFR -     TSH - continue medications, DASH diet, exercise and monitor at home. Call if greater than 130/80.   Mixed hyperlipidemia -     Lipid Profile check lipids decrease fatty foods increase activity.   Medication management -     Magnesium  Abnormal glucose -     Hemoglobin A1c ([facility]) Discussed disease progression and risks Discussed diet/exercise, weight management and risk modification   Vitamin D deficiency -     Vitamin D (25 hydroxy)  Testosterone deficiency -     Testosterone, Total    Continue diet and meds as discussed. Further disposition pending results of labs. Over 30 minutes of exam, counseling, chart review, and critical decision making was performed  Future Appointments  Date Time Provider [place]  05/10/2021  3:30 PM Magda Bernheim, NP [facility] None  11/08/2021  2:00 PM Unk Pinto, MD [facility] None     HPI 70 y.o. right handed male  presents for 3 month follow up on hypertension, cholesterol, prediabetes, and vitamin D deficiency.   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 He states he is feeling weaker and weaker walking to the truck after he gets off of work. States he is having a hard time standing from a chair.  Denies any back pain. Patient denies fever, hematuria, incontinence, numbness, tingling and saddle anesthesia He was on zinc and smoothies in the morning that was helping with energy. He started to wear compression socks last two evenings and he is feeling slightly better.  Had normal stress test 2018 with Dr. Gwenlyn Found  Negative lyme, ANA 2017, neg RF, negative ESR.   He states he gets up at 430, he is waking up at 230. He does drink coffee after dinner. He has to get up to urinate once at night. He snores at night. When he wakes up at 430 he does feel like he has slept  well.   His blood pressure has been controlled at home, today their BP is    He does not workout. He denies chest pain, shortness of breath, dizziness. BMI is There is no height or weight on file to calculate BMI., he is working on diet and exercise. Wt Readings from Last 3 Encounters:  11/08/20 205 lb (93 kg)  07/24/20 201 lb (91.2 kg)  04/19/20 196 lb 6.4 oz (89.1 kg)    He  is not  on cholesterol medication and denies myalgias. His cholesterol is at goal. The cholesterol last visit was:   Lab Results  Component Value Date   CHOL 143 11/08/2020   HDL 36 (L) 11/08/2020   LDLCALC 75 11/08/2020   TRIG 218 (H) 11/08/2020   CHOLHDL 4.0 11/08/2020    Last A1C in the office was:  Lab Results  Component Value Date   HGBA1C 5.1 11/08/2020   Patient is on Vitamin D supplement.   Lab Results  Component Value Date   VD25OH 72 11/08/2020     He has a history of testosterone deficiency and is on zinc and does smoothie with natural things to increase testosterone.  Lab Results  Component Value Date   TESTOSTERONE 240 (L) 11/08/2019     Current Medications:  Current Outpatient Medications on File Prior to Visit  Medication Sig   Ascorbic Acid (  VITAMIN C ADULT GUMMIES PO) Take 250 mg by mouth. Takes 500 mg   azithromycin (ZITHROMAX) 250 MG tablet Take 2 tablets (500 mg) on  Day 1,  followed by 1 tablet (250 mg) once daily on Days 2 through 5.   benzonatate (TESSALON) 200 MG capsule Take 1 tab three times a day as needed for cough.   Cholecalciferol (VITAMIN D PO) Take 10,000 Units by mouth daily.    CINNAMON PO Take 1 capsule by mouth daily.   dexamethasone (DECADRON) 1 MG tablet Take 3 tabs for 3 days, 2 tabs for 3 days 1 tab for 5 days. Take with food.   Multiple Vitamins-Minerals (MULTIVITAMIN GUMMIES MENS PO) Take by mouth daily.   OVER THE COUNTER MEDICATION Uses OTC Flonase NS PRN   OVER THE COUNTER MEDICATION OTC allergy tablet daily.   sildenafil (VIAGRA) 100 MG tablet Take      1/2 to 1 tablet     Daily       as needed for XXXX   TURMERIC PO Take 1 tablet by mouth daily.   zinc gluconate 50 MG tablet Take 50 mg by mouth daily.   No current facility-administered medications on file prior to visit.    Medical History:  Past Medical History:  Diagnosis Date   Arthritis    Cataract    Hearing aid worn    Allergies:  Allergies  Allergen Reactions   Codeine      Review of Systems:  Review of Systems  Constitutional:  Positive for malaise/fatigue. Negative for chills, diaphoresis, fever and weight loss.  HENT: Negative.    Eyes: Negative.   Respiratory: Negative.  Negative for shortness of breath.   Cardiovascular: Negative.  Negative for chest pain.  Gastrointestinal: Negative.   Genitourinary: Negative.   Musculoskeletal:  Positive for back pain and joint pain. Negative for falls, myalgias and neck pain.  Skin: Negative.   Neurological:  Positive for weakness. Negative for dizziness, tingling, tremors, sensory change, speech change, focal weakness, seizures, loss of consciousness and headaches.  Psychiatric/Behavioral: Negative.  Negative for depression.    Family history- Review and unchanged Social history- Review and unchanged Physical Exam: There were no vitals taken for this visit. Wt Readings from Last 3 Encounters:  11/08/20 205 lb (93 kg)  07/24/20 201 lb (91.2 kg)  04/19/20 196 lb 6.4 oz (89.1 kg)   General Appearance: Well nourished, in no apparent distress. Eyes: PERRLA, EOMs, conjunctiva no swelling or erythema Sinuses: No Frontal/maxillary tenderness ENT/Mouth: Ext aud canals clear, TMs without erythema, bulging. No erythema, swelling, or exudate on post pharynx.  Tonsils not swollen or erythematous. Hearing normal.  Neck: Supple, thyroid normal.  Respiratory: Respiratory effort normal, BS equal bilaterally without rales, rhonchi, wheezing or stridor.  Cardio: RRR with no MRGs. Brisk peripheral pulses without edema.  Abdomen:  Soft, + BS,  Non tender, no guarding, rebound, hernias, masses. Lymphatics: Non tender without lymphadenopathy.  Musculoskeletal: Full ROM, 5/5 strength, antalgic gait, Patient is able to ambulate well. Gait is  Antalgic. Straight leg raising with dorsiflexion negative bilaterally for radicular symptoms. Sensory exam in the legs are normal. Knee reflexes are normal Ankle reflexes are normal Strength is normal and symmetric in arms and legs. There is not SI tenderness to palpation.  There is not paraspinal muscle spasm.  There is not midline tenderness.  ROM of spine not limited Skin: Warm, dry without rashes, lesions, ecchymosis.  Neuro: Cranial nerves intact. Normal muscle tone, no cerebellar symptoms. Psych:  Awake and oriented X 3, normal affect, Insight and Judgment appropriate.    Magda Bernheim, NP 12:54 PM [facility]

## 2021-05-10 ENCOUNTER — Ambulatory Visit: Payer: BC Managed Care – PPO | Admitting: Nurse Practitioner

## 2021-05-21 NOTE — Progress Notes (Signed)
FOLLOW UP  Assessment and Plan:   Aortic Atherosclerosis Continue control of blood pressure, blood sugars, cholesterol and weight  Essential hypertension -     CBC with Diff -     COMPLETE METABOLIC PANEL WITH GFR - controlled without medications, DASH diet, exercise and monitor at home. Call if greater than 130/80.  Go to the ER if any chest pain, shortness of breath, nausea, dizziness, severe HA, changes vision/speech   Mixed hyperlipidemia -     Lipid Profile check lipids decrease fatty foods increase activity.   Medication management Continued  Abnormal glucose -     Hemoglobin A1c ([facility]) Discussed disease progression and risks Discussed diet/exercise, weight management and risk modification  Obesity Long discussion about weight loss, diet, and exercise Recommended diet heavy in fruits and veggies and low in animal meats, cheeses, and dairy products, appropriate calorie intake Follow up at next visit   Vitamin D deficiency Continue vit d supplementation to maintain therapeutic value between 60-100 Last Vit D 11/08/20 was in range at 72  Testosterone deficiency Continue zinc supplementation -     Testosterone, Total  Cough Promethazine DM as needed Mucinex during the day for congestion Continue to push fluids and Vit D, Vit c and zinc for immune suppport  Serous otitis media bilaterally Bactrim DS 1 tab PO BID x 10 days Prednisone 20 mg 3 tab x 3 days, 2 tabs x 3 days and 1 tab x 5 days Continue Mucinex and push fluids  Continue diet and meds as discussed. Further disposition pending results of labs. Over 30 minutes of exam, counseling, chart review, and critical decision making was performed  Future Appointments  Date Time Provider Department Center  11/08/2021  2:00 PM Lucky Cowboy, MD [facility] None     HPI 71 y.o. right handed male  presents for 3 month follow up on hypertension, cholesterol, prediabetes, and vitamin D deficiency.     He  does complain of nasal congestion and drainage which started 4 days ago.  He does have a nonproductive cough. Throat is dry, eyes are watering, ears are painful and feel congested. Denies fever, nausea, vomiting, diarrhea and muscle aches. He used to take an allergy pill but has been taking local honey which has been helping his allergies.   His blood pressure has been controlled at home, today their BP is BP: 130/68 BP Readings from Last 3 Encounters:  05/22/21 130/68  11/08/20 124/76  07/24/20 112/60     He does not workout. He denies chest pain, shortness of breath, dizziness. BMI is Body mass index is 30.75 kg/m., he is working on diet and exercise. 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  Wt Readings from Last 3 Encounters:  05/22/21 208 lb 3.2 oz (94.4 kg)  11/08/20 205 lb (93 kg)  07/24/20 201 lb (91.2 kg)    He  is not  on cholesterol medication and denies myalgias. His cholesterol is at goal. The cholesterol last visit was:   Lab Results  Component Value Date   CHOL 143 11/08/2020   HDL 36 (L) 11/08/2020   LDLCALC 75 11/08/2020   TRIG 218 (H) 11/08/2020   CHOLHDL 4.0 11/08/2020    Last A1C in the office was:  Lab Results  Component Value Date   HGBA1C 5.1 11/08/2020   Patient is on Vitamin D supplement.   Lab Results  Component Value Date   VD25OH 72 11/08/2020  He has a history of testosterone deficiency and is on zinc and does smoothie with natural ingredients to increase testosterone.  Lab Results  Component Value Date   TESTOSTERONE 240 (L) 11/08/2019     Current Medications:  Current Outpatient Medications on File Prior to Visit  Medication Sig   Ascorbic Acid (VITAMIN C ADULT GUMMIES PO) Take 250 mg by mouth. Takes 500 mg   Cholecalciferol (VITAMIN D PO) Take 10,000 Units by mouth daily.    Multiple Vitamins-Minerals (MULTIVITAMIN GUMMIES MENS PO) Take by mouth daily.   OVER THE COUNTER MEDICATION Uses OTC  Flonase NS PRN   OVER THE COUNTER MEDICATION OTC allergy tablet daily.   sildenafil (VIAGRA) 100 MG tablet Take     1/2 to 1 tablet     Daily       as needed for XXXX   TURMERIC PO Take 1 tablet by mouth daily.   zinc gluconate 50 MG tablet Take 50 mg by mouth daily.   No current facility-administered medications on file prior to visit.    Medical History:  Past Medical History:  Diagnosis Date   Arthritis    Cataract    Hearing aid worn    Allergies:  Allergies  Allergen Reactions   Codeine      Review of Systems:  Review of Systems  Constitutional:  Negative for chills, diaphoresis, fever, malaise/fatigue and weight loss.  HENT:  Positive for congestion and sore throat. Negative for hearing loss and tinnitus.   Eyes: Negative.  Negative for blurred vision.  Respiratory:  Positive for cough. Negative for shortness of breath.   Cardiovascular: Negative.  Negative for chest pain.  Gastrointestinal:  Negative for abdominal pain, constipation, diarrhea, heartburn, nausea and vomiting.  Genitourinary: Negative.   Musculoskeletal:  Positive for back pain and joint pain. Negative for falls, myalgias and neck pain.  Skin: Negative.   Neurological:  Negative for dizziness, tingling, tremors, sensory change, speech change, focal weakness, seizures, loss of consciousness, weakness and headaches.  Psychiatric/Behavioral: Negative.  Negative for depression.    Family history- Review and unchanged Social history- Review and unchanged Physical Exam: BP 130/68    Pulse 84    Temp (!) 97.5 F (36.4 C)    Wt 208 lb 3.2 oz (94.4 kg)    SpO2 96%    BMI 30.75 kg/m  Wt Readings from Last 3 Encounters:  05/22/21 208 lb 3.2 oz (94.4 kg)  11/08/20 205 lb (93 kg)  07/24/20 201 lb (91.2 kg)   General Appearance: Well nourished, in no apparent distress. Eyes: PERRLA, EOMs, conjunctiva no swelling or erythema Sinuses: No Frontal/maxillary tenderness ENT/Mouth: Ext aud canals clear, Tms bulging  with erythema bilaterally. No erythema, swelling, or exudate on post pharynx.  Tonsils not swollen or erythematous. Hearing normal.  Neck: Supple, thyroid normal.  Respiratory: Respiratory effort normal, BS equal bilaterally without rales, rhonchi, wheezing or stridor.  Cardio: RRR with no MRGs. Brisk peripheral pulses without edema.  Abdomen: Soft, + BS,  Non tender, no guarding, rebound, hernias, masses. Lymphatics: Non tender without lymphadenopathy.  Musculoskeletal: Full ROM, 5/5 strength, antalgic gait, Patient is able to ambulate well.  Skin: Warm, dry without rashes, lesions, ecchymosis.  Neuro: Cranial nerves intact. Normal muscle tone, no cerebellar symptoms. Psych: Awake and oriented X 3, normal affect, Insight and Judgment appropriate.    Revonda Humphrey, NP 4:19 PM [facility]

## 2021-05-22 ENCOUNTER — Ambulatory Visit (INDEPENDENT_AMBULATORY_CARE_PROVIDER_SITE_OTHER): Payer: BC Managed Care – PPO | Admitting: Nurse Practitioner

## 2021-05-22 ENCOUNTER — Other Ambulatory Visit: Payer: Self-pay

## 2021-05-22 ENCOUNTER — Encounter: Payer: Self-pay | Admitting: Nurse Practitioner

## 2021-05-22 VITALS — BP 130/68 | HR 84 | Temp 97.5°F | Wt 208.2 lb

## 2021-05-22 DIAGNOSIS — E559 Vitamin D deficiency, unspecified: Secondary | ICD-10-CM

## 2021-05-22 DIAGNOSIS — E782 Mixed hyperlipidemia: Secondary | ICD-10-CM | POA: Diagnosis not present

## 2021-05-22 DIAGNOSIS — E669 Obesity, unspecified: Secondary | ICD-10-CM

## 2021-05-22 DIAGNOSIS — R051 Acute cough: Secondary | ICD-10-CM

## 2021-05-22 DIAGNOSIS — H6500 Acute serous otitis media, unspecified ear: Secondary | ICD-10-CM

## 2021-05-22 DIAGNOSIS — I10 Essential (primary) hypertension: Secondary | ICD-10-CM | POA: Diagnosis not present

## 2021-05-22 DIAGNOSIS — I700 Atherosclerosis of aorta: Secondary | ICD-10-CM

## 2021-05-22 DIAGNOSIS — E349 Endocrine disorder, unspecified: Secondary | ICD-10-CM

## 2021-05-22 DIAGNOSIS — Z79899 Other long term (current) drug therapy: Secondary | ICD-10-CM

## 2021-05-22 DIAGNOSIS — R7309 Other abnormal glucose: Secondary | ICD-10-CM | POA: Diagnosis not present

## 2021-05-22 MED ORDER — PROMETHAZINE-DM 6.25-15 MG/5ML PO SYRP
5.0000 mL | ORAL_SOLUTION | Freq: Four times a day (QID) | ORAL | 1 refills | Status: DC | PRN
Start: 2021-05-22 — End: 2021-11-29

## 2021-05-22 MED ORDER — SULFAMETHOXAZOLE-TRIMETHOPRIM 400-80 MG PO TABS: 1.0000 | ORAL_TABLET | Freq: Two times a day (BID) | ORAL | 0 refills | Status: AC

## 2021-05-22 MED ORDER — PREDNISONE 20 MG PO TABS
ORAL_TABLET | ORAL | 0 refills | Status: AC
Start: 2021-05-22 — End: 2021-06-02

## 2021-05-23 LAB — CBC WITH DIFFERENTIAL/PLATELET
Absolute Monocytes: 980 cells/uL — ABNORMAL HIGH (ref 200–950)
Basophils Absolute: 59 cells/uL (ref 0–200)
Basophils Relative: 0.6 %
Eosinophils Absolute: 218 cells/uL (ref 15–500)
Eosinophils Relative: 2.2 %
HCT: 47.1 % (ref 38.5–50.0)
Hemoglobin: 16.1 g/dL (ref 13.2–17.1)
Lymphs Abs: 2257 cells/uL (ref 850–3900)
MCH: 30.4 pg (ref 27.0–33.0)
MCHC: 34.2 g/dL (ref 32.0–36.0)
MCV: 89 fL (ref 80.0–100.0)
MPV: 11.2 fL (ref 7.5–12.5)
Monocytes Relative: 9.9 %
Neutro Abs: 6386 cells/uL (ref 1500–7800)
Neutrophils Relative %: 64.5 %
Platelets: 254 10*3/uL (ref 140–400)
RBC: 5.29 10*6/uL (ref 4.20–5.80)
RDW: 12.6 % (ref 11.0–15.0)
Total Lymphocyte: 22.8 %
WBC: 9.9 10*3/uL (ref 3.8–10.8)

## 2021-05-23 LAB — COMPLETE METABOLIC PANEL WITH GFR
AG Ratio: 2.1 (calc) (ref 1.0–2.5)
ALT: 21 U/L (ref 9–46)
AST: 21 U/L (ref 10–35)
Albumin: 4.4 g/dL (ref 3.6–5.1)
Alkaline phosphatase (APISO): 75 U/L (ref 35–144)
BUN: 20 mg/dL (ref 7–25)
CO2: 28 mmol/L (ref 20–32)
Calcium: 10.2 mg/dL (ref 8.6–10.3)
Chloride: 105 mmol/L (ref 98–110)
Creat: 1.24 mg/dL (ref 0.70–1.28)
Globulin: 2.1 g/dL (calc) (ref 1.9–3.7)
Glucose, Bld: 132 mg/dL — ABNORMAL HIGH (ref 65–99)
Potassium: 4.4 mmol/L (ref 3.5–5.3)
Sodium: 140 mmol/L (ref 135–146)
Total Bilirubin: 0.5 mg/dL (ref 0.2–1.2)
Total Protein: 6.5 g/dL (ref 6.1–8.1)
eGFR: 63 mL/min/{1.73_m2} (ref >=60)

## 2021-05-23 LAB — HEMOGLOBIN A1C
Hgb A1c MFr Bld: 5.5 % of total Hgb (ref <5.7)
Mean Plasma Glucose: 111 mg/dL
eAG (mmol/L): 6.2 mmol/L

## 2021-05-23 LAB — LIPID PANEL
Cholesterol: 155 mg/dL (ref <200)
HDL: 34 mg/dL — ABNORMAL LOW (ref >=40)
LDL Cholesterol (Calc): 84 mg/dL (calc)
Non-HDL Cholesterol (Calc): 121 mg/dL (calc) (ref <130)
Total CHOL/HDL Ratio: 4.6 (calc) (ref <5.0)
Triglycerides: 280 mg/dL — ABNORMAL HIGH (ref <150)

## 2021-05-23 LAB — TESTOSTERONE: Testosterone: 160 ng/dL — ABNORMAL LOW (ref 250–827)

## 2021-07-26 ENCOUNTER — Encounter: Payer: Self-pay | Admitting: Adult Health

## 2021-07-26 ENCOUNTER — Ambulatory Visit (INDEPENDENT_AMBULATORY_CARE_PROVIDER_SITE_OTHER): Payer: BC Managed Care – PPO | Admitting: Adult Health

## 2021-07-26 ENCOUNTER — Other Ambulatory Visit: Payer: Self-pay

## 2021-07-26 VITALS — BP 122/76 | HR 77 | Temp 98.1°F | Wt 198.0 lb

## 2021-07-26 DIAGNOSIS — N529 Male erectile dysfunction, unspecified: Secondary | ICD-10-CM | POA: Diagnosis not present

## 2021-07-26 DIAGNOSIS — R5383 Other fatigue: Secondary | ICD-10-CM

## 2021-07-26 DIAGNOSIS — J0100 Acute maxillary sinusitis, unspecified: Secondary | ICD-10-CM | POA: Diagnosis not present

## 2021-07-26 MED ORDER — AMOXICILLIN-POT CLAVULANATE 875-125 MG PO TABS: 1.0000 | ORAL_TABLET | Freq: Two times a day (BID) | ORAL | 0 refills | Status: DC

## 2021-07-26 MED ORDER — PREDNISONE 20 MG PO TABS: ORAL_TABLET | ORAL | 0 refills | Status: DC

## 2021-07-26 MED ORDER — SILDENAFIL CITRATE 100 MG PO TABS: ORAL_TABLET | ORAL | 2 refills | Status: DC

## 2021-07-26 NOTE — Patient Instructions (Addendum)
? ? ? ?Try mylanta or maalox  ? ?Add nexium 1 tab daily before breakfast/coffee for 2 weeks  ? ?Recommend stopping energy drink - avoid caffeine after noon ? ?Increase sleep - give yourself the chance to sleep at least 8 hours daily ? ?Recommend to reduce work schedule a bit  ? ? ?Fatigue ?If you have fatigue, you feel tired all the time and have a lack of energy or a lack of motivation. Fatigue may make it difficult to start or complete tasks because of exhaustion. In general, occasional or mild fatigue is often a normal response to activity or life. However, long-lasting (chronic) or extreme fatigue may be a symptom of a medical condition. ?Follow these instructions at home: ?General instructions ?Watch your fatigue for any changes. ?Go to bed and get up at the same time every day. ?Avoid fatigue by pacing yourself during the day and getting enough sleep at night. ?Maintain a healthy weight. ?Medicines ?Take over-the-counter and prescription medicines only as told by your health care provider. ?Take a multivitamin, if told by your health care provider.  ?Do not use herbal or dietary supplements unless they are approved by your health care provider. ?Activity ? ?Exercise regularly, as told by your health care provider. ?Use or practice techniques to help you relax, such as yoga, tai chi, meditation, or massage therapy. ?Eating and drinking ? ?Avoid heavy meals in the evening. ?Eat a well-balanced diet, which includes lean proteins, whole grains, plenty of fruits and vegetables, and low-fat dairy products. ?Avoid consuming too much caffeine. ?Avoid the use of alcohol. ?Drink enough fluid to keep your urine pale yellow. ?Lifestyle ?Change situations that cause you stress. Try to keep your work and personal schedule in balance. ?Do not use any products that contain nicotine or tobacco, such as cigarettes and e-cigarettes. If you need help quitting, ask your health care provider. ?Do not use drugs. ?Contact a health  care provider if: ?Your fatigue does not get better. ?You have a fever. ?You suddenly lose or gain weight. ?You have headaches. ?You have trouble falling asleep or sleeping through the night. ?You feel angry, guilty, anxious, or sad. ?You are unable to have a bowel movement (constipation). ?Your skin is dry. ?You have swelling in your legs or another part of your body. ?Get help right away if: ?You feel confused. ?Your vision is blurry. ?You feel faint or you pass out. ?You have a severe headache. ?You have severe pain in your abdomen, your back, or the area between your waist and hips (pelvis). ?You have chest pain, shortness of breath, or an irregular or fast heartbeat. ?You are unable to urinate, or you urinate less than normal. ?You have abnormal bleeding, such as bleeding from the rectum, vagina, nose, lungs, or nipples. ?You vomit blood. ?You have thoughts about hurting yourself or others. ?If you ever feel like you may hurt yourself or others, or have thoughts about taking your own life, get help right away. You can go to your nearest emergency department or call: ?Your local emergency services (911 in the [place] ?A suicide crisis helpline, such as the [place] at 252-186-7252 or 988 in the [place]. This is open 24 hours a day. ?Summary ?If you have fatigue, you feel tired all the time and have a lack of energy or a lack of motivation. ?Fatigue may make it difficult to start or complete tasks because of exhaustion. ?Long-lasting (chronic) or extreme fatigue may be a symptom of a medical condition. ?  Exercise regularly, as told by your health care provider. ?Change situations that cause you stress. Try to keep your work and personal schedule in balance. ?This information is not intended to replace advice given to you by your health care provider. Make sure you discuss any questions you have with your health care provider. ?Document Revised: 11/22/2020 Document Reviewed:  03/09/2020 ?Elsevier Patient Education ? [place]. ? ?

## 2021-07-26 NOTE — Progress Notes (Signed)
Assessment and Plan: ? ?Perry Evans was seen today for medication management. ? ?Diagnoses and all orders for this visit: ? ?Fatigue, unspecified type ?Vague, non-specific ?Advised to reduce caffeine, increase water, increase sleep ?Encouraged to back off a bit at work, he is contemplating reducing to part time, work is supportive ?EKG unremarkable/unchanged ?If labs unremarkable, not improving with the above changes follow up in 1-2 months.  ?Consider wellbutrin -  ?Does have smoking hx, monitor closely for any sign of cancer - currently UTD on routine cancer screening, no specific red flags ?-     CBC with Differential/Platelet ?-     COMPLETE METABOLIC PANEL WITH GFR ?-     TSH ?-     Vitamin B12 ?-     Urinalysis, Routine w reflex microscopic ?-     EKG 12-Lead ? ?Acute non-recurrent maxillary sinusitis ?-     amoxicillin-clavulanate (AUGMENTIN) 875-125 MG tablet; Take 1 tablet by mouth 2 (two) times daily. With food. ?-     predniSONE (DELTASONE) 20 MG tablet; 2 tablets daily for 3 days, 1 tablet daily for 4 days. ? ?Erectile dysfunction, unspecified erectile dysfunction type ?-     sildenafil (VIAGRA) 100 MG tablet; Take     1/2 to 1 tablet     Daily       as needed for XXXX ? ? ?Further disposition pending results of labs. Discussed med's effects and SE's.   ?Over 30 minutes of exam, counseling, chart review, and critical decision making was performed.  ? ?Future Appointments  ?Date Time Provider Department Center  ?10/24/2021  4:00 PM Lucky CowboyMcKeown, William, MD [facility] None  ?11/08/2021  2:00 PM Lucky CowboyMcKeown, William, MD [facility] None  ? ? ?------------------------------------------------------------------------------------------------------------------ ? ? ?HPI ?BP 122/76   Pulse 77   Temp 98.1 ?F (36.7 ?C)   Wt 198 lb (89.8 kg)   SpO2 99%   BMI 29.24 kg/m?  ?71 y.o.male presents for evaluation due to recommendations to seek tx for sinusitis and fatigue.  ? ?He reports was having rhinitis, R maxillary pressure,  assumed was allergies, denies notable pain, fever/chills, recently had xrays by dentist for implant evaluation, was advised R> L sinusitis, recommended treatment prior to procedure.  ? ?He reports has been noting progressive fatigue in the last year, does wake up feeling rested but during the day has been reaching for more caffeine, worse in the last week and has been drinking 2 ? "Census" energy drinks (low sugar, lower caffeine per patient). He notes in the last week has also had reduced appetite, vaguely unwell after eating out at [employer], no specific GI sx (denies n/v/d/c/dark stool or blood). Has called out last 2 days.  ? ?Denies notable specific sx otherwise on comprehensive ROS; notably denies cardiac accompaniments, though has felt fatigued enough he had to take a break after climbing stairs to home recently.  ? ?He is still working Midwifemanual job repairing lawn equipment, admits working too much. Estimates 7 hours of sleep each night, denies snoring, AM HA.  ? ? ? ?CBC Latest Ref Rng & Units 05/22/2021 11/08/2020 04/19/2020  ?WBC 3.8 - 10.8 Thousand/uL 9.9 7.5 7.7  ?Hemoglobin 13.2 - 17.1 g/dL 69.616.1 29.515.6 28.416.8  ?Hematocrit 38.5 - 50.0 % 47.1 46.2 48.3  ?Platelets 140 - 400 Thousand/uL 254 228 223  ? ?Lab Results  ?Component Value Date  ? TSH 2.10 11/08/2020  ? ?Lab Results  ?Component Value Date  ? [id number] 555 11/08/2019  ? ?Hx of testosterone def, was on  injections and topical gel but stopped in 2018 due to hair loss and patient preference. Sildenafil works well PRN for ED, requesting refill.  ?Lab Results  ?Component Value Date  ? TESTOSTERONE 160 (L) 05/22/2021  ? ?On supplement ?Lab Results  ?Component Value Date  ? VD25OH 72 11/08/2020  ?   ?BMI is Body mass index is 29.24 kg/m?., he has been working on diet, eating smaller portions per Dr. Oneta Rack recommendation, "just pushing away" once full, down 10 lb but feels this is proportionate to portion reduction.  ?Wt Readings from Last 3 Encounters:   ?07/26/21 198 lb (89.8 kg)  ?05/22/21 208 lb 3.2 oz (94.4 kg)  ?11/08/20 205 lb (93 kg)  ? ?He does report smoked about 10 years, quit over 20 years ago.  ? ? ?Past Medical History:  ?Diagnosis Date  ? Arthritis   ? Cataract   ? Hearing aid worn   ?  ? ?Allergies  ?Allergen Reactions  ? Codeine   ? ? ?Current Outpatient Medications on File Prior to Visit  ?Medication Sig  ? Ascorbic Acid (VITAMIN C ADULT GUMMIES PO) Take 250 mg by mouth. Takes 500 mg  ? Cholecalciferol (VITAMIN D PO) Take 10,000 Units by mouth daily.   ? Multiple Vitamins-Minerals (MULTIVITAMIN GUMMIES MENS PO) Take by mouth daily.  ? Omega-3 Fatty Acids (OMEGA 3 PO) Take by mouth.  ? OVER THE COUNTER MEDICATION Uses OTC Flonase NS PRN  ? OVER THE COUNTER MEDICATION OTC allergy tablet daily.  ? sildenafil (VIAGRA) 100 MG tablet Take     1/2 to 1 tablet     Daily       as needed for XXXX  ? TURMERIC PO Take 1 tablet by mouth daily.  ? zinc gluconate 50 MG tablet Take 50 mg by mouth daily.  ? promethazine-dextromethorphan (PROMETHAZINE-DM) 6.25-15 MG/5ML syrup Take 5 mLs by mouth 4 (four) times daily as needed for cough. (Patient not taking: Reported on 07/26/2021)  ? ?No current facility-administered medications on file prior to visit.  ? ? ?Allergies:  ?Allergies  ?Allergen Reactions  ? Codeine   ? ?Surgical History:  ?He  has a past surgical history that includes Eye surgery and Pancreas surgery. ?Family History:  ?Hisfamily history includes Heart disease in his paternal uncle; Heart disease (age of onset: 93) in his paternal grandfather; Heart disease (age of onset: 45) in his paternal aunt; Heart disease (age of onset: 65) in his father. ?Social History:  ? reports that he quit smoking about 23 years ago. His smoking use included cigarettes. He has never used smokeless tobacco. He reports that he does not drink alcohol and does not use drugs. ? ? ?ROS: Review of Systems  ?Constitutional:  Positive for malaise/fatigue and weight loss  (intentional). Negative for chills, diaphoresis and fever.  ?HENT:  Positive for congestion and sinus pain (mild R maxillary). Negative for hearing loss, sore throat and tinnitus.   ?Eyes:  Negative for blurred vision and double vision.  ?Respiratory:  Negative for cough, sputum production and shortness of breath.   ?Cardiovascular:  Negative for chest pain, palpitations, orthopnea, claudication, leg swelling and PND.  ?Gastrointestinal:  Positive for heartburn (mild, more with caffeine). Negative for abdominal pain, blood in stool, constipation, diarrhea, nausea and vomiting.  ?Genitourinary: Negative.   ?Musculoskeletal:  Negative for falls, joint pain and myalgias.  ?Skin:  Negative for rash.  ?Neurological:  Negative for dizziness, sensory change, focal weakness, weakness and headaches.  ?Endo/Heme/Allergies:  Positive for environmental  allergies. Negative for polydipsia. Does not bruise/bleed easily.  ?Psychiatric/Behavioral:  Negative for depression, memory loss, substance abuse and suicidal ideas. The patient is not nervous/anxious and does not have insomnia.   ? ? ?Physical Exam: ? ?BP 122/76   Pulse 77   Temp 98.1 ?F (36.7 ?C)   Wt 198 lb (89.8 kg)   SpO2 99%   BMI 29.24 kg/m?  ? ?General Appearance: Well nourished, in no apparent distress. ?Eyes: PERRLA, EOMs, conjunctiva no swelling or erythema ?Sinuses: No Frontal/maxillary tenderness ?ENT/Mouth: Ext aud canals clear, TMs without erythema, bulging. No erythema, swelling, or exudate on post pharynx.  Tonsils not swollen or erythematous. Hearing normal.  ?Neck: Supple, thyroid normal.  ?Respiratory: Respiratory effort normal, BS equal bilaterally without rales, rhonchi, wheezing or stridor.  ?Cardio: RRR with no MRGs. Brisk peripheral pulses without edema.  ?Abdomen: Soft, + BS.  Non tender, no guarding, rebound, hernias, masses. ?Lymphatics: Non tender without lymphadenopathy.  ?Musculoskeletal: Full ROM, 5/5 strength, normal gait.  ?Skin: Warm, dry  without rashes, lesions, ecchymosis.  ?Neuro: Cranial nerves intact. Normal muscle tone, no cerebellar symptoms. Sensation intact.  ?Psych: Awake and oriented X 3, normal affect, Insight and Judgment appropriate.  ?  ? ?Alric Quan

## 2021-07-30 ENCOUNTER — Other Ambulatory Visit: Payer: Self-pay | Admitting: Nurse Practitioner

## 2021-07-30 DIAGNOSIS — N529 Male erectile dysfunction, unspecified: Secondary | ICD-10-CM

## 2021-07-30 MED ORDER — SILDENAFIL CITRATE 100 MG PO TABS: ORAL_TABLET | ORAL | 2 refills | Status: DC

## 2021-08-08 LAB — URINALYSIS, ROUTINE W REFLEX MICROSCOPIC
Bilirubin Urine: NEGATIVE
Glucose, UA: NEGATIVE
Hgb urine dipstick: NEGATIVE
Ketones, ur: NEGATIVE
Leukocytes,Ua: NEGATIVE
Nitrite: NEGATIVE
Protein, ur: NEGATIVE
Specific Gravity, Urine: 1.022 (ref 1.001–1.035)
pH: 5.5 (ref 5.0–8.0)

## 2021-08-08 LAB — COMPLETE METABOLIC PANEL WITH GFR
AG Ratio: 2.2 (calc) (ref 1.0–2.5)
ALT: 22 U/L (ref 9–46)
AST: 23 U/L (ref 10–35)
Albumin: 4.3 g/dL (ref 3.6–5.1)
Alkaline phosphatase (APISO): 73 U/L (ref 35–144)
BUN: 16 mg/dL (ref 7–25)
CO2: 24 mmol/L (ref 20–32)
Calcium: 10.1 mg/dL (ref 8.6–10.3)
Chloride: 104 mmol/L (ref 98–110)
Creat: 0.91 mg/dL (ref 0.70–1.28)
Globulin: 2 g/dL (calc) (ref 1.9–3.7)
Glucose, Bld: 111 mg/dL — ABNORMAL HIGH (ref 65–99)
Potassium: 4.2 mmol/L (ref 3.5–5.3)
Sodium: 139 mmol/L (ref 135–146)
Total Bilirubin: 0.6 mg/dL (ref 0.2–1.2)
Total Protein: 6.3 g/dL (ref 6.1–8.1)
eGFR: 91 mL/min/{1.73_m2} (ref >=60)

## 2021-08-08 LAB — CBC WITH DIFFERENTIAL/PLATELET
Absolute Monocytes: 644 cells/uL (ref 200–950)
Basophils Absolute: 22 cells/uL (ref 0–200)
Basophils Relative: 0.4 %
Eosinophils Absolute: 171 cells/uL (ref 15–500)
Eosinophils Relative: 3.1 %
HCT: 48.8 % (ref 38.5–50.0)
Hemoglobin: 16.7 g/dL (ref 13.2–17.1)
Lymphs Abs: 1656 cells/uL (ref 850–3900)
MCH: 30.4 pg (ref 27.0–33.0)
MCHC: 34.2 g/dL (ref 32.0–36.0)
MCV: 88.9 fL (ref 80.0–100.0)
MPV: 10.7 fL (ref 7.5–12.5)
Monocytes Relative: 11.7 %
Neutro Abs: 3009 cells/uL (ref 1500–7800)
Neutrophils Relative %: 54.7 %
Platelets: 244 10*3/uL (ref 140–400)
RBC: 5.49 10*6/uL (ref 4.20–5.80)
RDW: 12.8 % (ref 11.0–15.0)
Total Lymphocyte: 30.1 %
WBC: 5.5 10*3/uL (ref 3.8–10.8)

## 2021-08-08 LAB — VITAMIN B12: Vitamin B-12: 418 pg/mL (ref 200–1100)

## 2021-08-08 LAB — SPECIMEN COMPROMISED

## 2021-08-08 LAB — TSH: TSH: 3.5 mIU/L (ref 0.40–4.50)

## 2021-10-24 ENCOUNTER — Ambulatory Visit: Payer: BC Managed Care – PPO | Admitting: Internal Medicine

## 2021-11-08 ENCOUNTER — Encounter: Payer: BC Managed Care – PPO | Admitting: Internal Medicine

## 2021-11-29 NOTE — Progress Notes (Signed)
Annual  Screening/Preventative Visit  & Comprehensive Evaluation & Examination  Future Appointments  Date Time Provider Department  11/30/2021 11:00 AM Lucky Cowboy, MD [facility]  12/05/2022 11:00 AM Lucky Cowboy, MD [facility]              This very nice 71 y.o. MWM presents for a Screening /Preventative Visit & comprehensive evaluation and management of multiple medical co-morbidities.  Patient has been followed for HTN, HLD, T glucose intolerance and Vitamin D Deficiency.       Labile HTN predates circa 2007. Patient's BP has been controlled at home.  Today's BP is at goal - 118/70 .  Patient denies any cardiac symptoms as chest pain, palpitations, shortness of breath, dizziness or ankle swelling.       Patient's hyperlipidemia is controlled with diet and medications. Patient denies myalgias or other medication SE's. Last lipids were at goal except elevated Trig's :  Lab Results  Component Value Date   CHOL 155 05/22/2021   HDL 34 (L) 05/22/2021   LDLCALC 84 05/22/2021   TRIG 280 (H) 05/22/2021   CHOLHDL 4.6 05/22/2021        Patient is followed expectantly for glucose intolerance  and patient denies reactive hypoglycemic symptoms, visual blurring, diabetic polys or paresthesias. Last A1c was normal & at goal:   Lab Results  Component Value Date   HGBA1C 5.5 05/22/2021          Finally, patient has history of Vitamin D Deficiency ("32" /2010) and last vitamin D was at goal:   Lab Results  Component Value Date   VD25OH 72 11/08/2020      Current Outpatient Medications:     VITAMIN C 500 MG tablet, Takes 1 tablet Daily   VITAMIN D) 125 MCG (5000 UT) CAPS, Takes 1 capsule Daily   multivitamin TABS , Takes 1 tablet  Daily   Omega-3OMEGA 3 Take daily    TURMERIC  Take 1 tablet  daily   zinc e 50 MG tablet, Take  daily   sildenafil (100 MG tablet, Take 1/2 to 1 tablet Daily as needed for XXXX   Allergies  Allergen Reactions   Codeine      Past  Medical History:  Diagnosis Date   Arthritis    Cataract    Hearing aid worn      Health Maintenance  Topic Date Due   COVID-19 Vaccine (1) Never done   Zoster Vaccines- Shingrix (1 of 2) Never done   PNA vac  (2 of 2 - PPSV23) 11/02/2019   INFLUENZA VACCINE  12/11/2020   Fecal DNA (Cologuard)  11/23/2021   TETANUS/TDAP  11/01/2028   Hepatitis C Screening  Completed   HPV VACCINES  Aged Out     Immunization History  Administered Date(s) Administered   DT (Pediatric) 05/13/1996   Hepatitis A 05/13/2009   Influenza, High Dose  02/23/2020   Influenza 02/10/2018   PPD Test 09/16/2016   Pneumococcal -13 11/02/2018   Td 11/02/2018   Tdap 08/26/2008    Last Colon - 06/06/2005 - Dr Arlyce Dice and he's aware that he's overdue 10 yr recall. He was sent a 10 year recall letter in 2017 by Dr Myrtie Neither.  Past Surgical History:  Procedure Laterality Date   EYE SURGERY     PANCREAS SURGERY       Family History  Problem Relation Age of Onset   Heart disease Father 91       killed   Heart disease  Paternal Aunt 9   Heart disease Paternal Uncle        3 uncles all died 59-70   Heart disease Paternal Grandfather 66       MI died    Social History   Socioeconomic History   Marital status: Married    Spouse name: Not on file   Number of children: Not on file   Years of education: Not on file   Highest education level: Not on file  Occupational History   Not on file  Tobacco Use   Smoking status: Former    Pack years: 0.00    Types: Cigarettes    Quit date: 05/13/1998    Years since quitting: 22.5   Smokeless tobacco: Never  Vaping Use   Vaping Use: Never used  Substance and Sexual Activity   Alcohol use: No   Drug use: No   Sexual activity: Not on file  Other Topics Concern   Not on file  Social History Narrative   Employment: works in Teacher, English as a foreign language for [employer].     ROS Constitutional: Denies fever, chills, weight loss/gain, headaches,  insomnia,  night sweats or change in appetite. Does c/o fatigue. Eyes: Denies redness, blurred vision, diplopia, discharge, itchy or watery eyes.  ENT: Denies discharge, congestion, post nasal drip, epistaxis, sore throat, earache, hearing loss, dental pain, Tinnitus, Vertigo, Sinus pain or snoring.  Cardio: Denies chest pain, palpitations, irregular heartbeat, syncope, dyspnea, diaphoresis, orthopnea, PND, claudication or edema Respiratory: denies cough, dyspnea, DOE, pleurisy, hoarseness, laryngitis or wheezing.  Gastrointestinal: Denies dysphagia, heartburn, reflux, water brash, pain, cramps, nausea, vomiting, bloating, diarrhea, constipation, hematemesis, melena, hematochezia, jaundice or hemorrhoids Genitourinary: Denies dysuria, frequency, urgency, nocturia, hesitancy, discharge, hematuria or flank pain Musculoskeletal: Denies arthralgia, myalgia, stiffness, Jt. Swelling, pain, limp or strain/sprain. Denies Falls. Skin: Denies puritis, rash, hives, warts, acne, eczema or change in skin lesion Neuro: No weakness, tremor, incoordination, spasms, paresthesia or pain Psychiatric: Denies confusion, memory loss or sensory loss. Denies Depression. Endocrine: Denies change in weight, skin, hair change, nocturia, and paresthesia, diabetic polys, visual blurring or hyper / hypo glycemic episodes.  Heme/Lymph: No excessive bleeding, bruising or enlarged lymph nodes.   Physical Exam  BP 118/70   Pulse 80   Temp 98 F (36.7 C)   Resp 17   Ht 5\' 9"  (1.753 m)   Wt 198 lb 12.8 oz (90.2 kg)   SpO2 98%   BMI 29.36 kg/m   General Appearance: Well nourished and well groomed and in no apparent distress.  Eyes: PERRLA, EOMs, conjunctiva no swelling or erythema, normal fundi and vessels. Sinuses: No frontal/maxillary tenderness ENT/Mouth: EACs patent / TMs  nl. Nares clear without erythema, swelling, mucoid exudates. Oral hygiene is good. No erythema, swelling, or exudate. Tongue normal,  non-obstructing. Tonsils not swollen or erythematous. Hearing normal.  Neck: Supple, thyroid not palpable. No bruits, nodes or JVD. Respiratory: Respiratory effort normal.  BS equal and clear bilateral without rales, rhonci, wheezing or stridor. Cardio: Heart sounds are normal with regular rate and rhythm and no murmurs, rubs or gallops. Peripheral pulses are normal and equal bilaterally without edema. No aortic or femoral bruits. Chest: symmetric with normal excursions and percussion.  Abdomen: Soft, with Nl bowel sounds. Nontender, no guarding, rebound, hernias, masses, or organomegaly.  Lymphatics: Non tender without lymphadenopathy.  Musculoskeletal: Full ROM all peripheral extremities, joint stability, 5/5 strength, and normal gait. Skin: Warm and dry without rashes, lesions, cyanosis, clubbing or  ecchymosis.  Neuro: Cranial nerves  intact, reflexes equal bilaterally. Normal muscle tone, no cerebellar symptoms. Sensation intact.  Pysch: Alert and oriented X 3 with normal affect, insight and judgment appropriate.   Assessment and Plan  1. Annual Preventative/Screening Exam   2. Labile hypertension  - EKG 12-Lead - [place], RETROPERITNL ABD,  LTD - Urinalysis, Routine w reflex microscopic - Microalbumin / creatinine urine ratio  3. Hyperlipidemia, mixed  - EKG 12-Lead - [place], RETROPERITNL ABD,  LTD  4. Abnormal glucose  - EKG 12-Lead - [place], RETROPERITNL ABD,  LTD  5. Vitamin D deficiency  - VITAMIN D 25 Hydroxy   6. Aortic atherosclerosis (HCC) bby Lumbar Xrays 2021  - EKG 12-Lead - [place], RETROPERITNL ABD,  LTD - Lipid panel  7. BPH with obstruction/lower urinary tract symptoms  - PSA  8. Screening for colorectal cancer  - POC Hemoccult Bld/Stl   9. Prostate cancer screening  - PSA  10. Screening for heart disease  - EKG 12-Lead  11. FHx: heart disease  - EKG 12-Lead - [place], RETROPERITNL ABD,  LTD  12. Former smoker  - EKG 12-Lead - [place], RETROPERITNL ABD,   LTD  13. Screening for AAA (aortic abdominal aneurysm)  - [place], RETROPERITNL ABD,  LTD  14. Fatigue - CBC with Differential/Platelet - TSH  15. Medication management  - CBC with Differential/Platelet - COMPLETE METABOLIC PANEL WITH GFR - Magnesium - Lipid panel - TSH - Hemoglobin A1c - Insulin, random - VITAMIN D 25 Hydroxy          Patient was counseled in prudent diet, weight control to achieve/maintain BMI less than 25, BP monitoring, regular exercise and medications as discussed.  Discussed med effects and SE's. Routine screening labs and tests as requested with regular follow-up as recommended. Over 40 minutes of exam, counseling, chart review and high complex critical decision making was performed   Perry Maw, MD

## 2021-11-29 NOTE — Patient Instructions (Signed)

## 2021-11-30 ENCOUNTER — Ambulatory Visit (INDEPENDENT_AMBULATORY_CARE_PROVIDER_SITE_OTHER): Payer: BC Managed Care – PPO | Admitting: Internal Medicine

## 2021-11-30 ENCOUNTER — Encounter: Payer: Self-pay | Admitting: Internal Medicine

## 2021-11-30 VITALS — BP 118/70 | HR 80 | Temp 98.0°F | Resp 17 | Ht 69.0 in | Wt 198.8 lb

## 2021-11-30 DIAGNOSIS — I700 Atherosclerosis of aorta: Secondary | ICD-10-CM

## 2021-11-30 DIAGNOSIS — Z79899 Other long term (current) drug therapy: Secondary | ICD-10-CM

## 2021-11-30 DIAGNOSIS — R0989 Other specified symptoms and signs involving the circulatory and respiratory systems: Secondary | ICD-10-CM | POA: Diagnosis not present

## 2021-11-30 DIAGNOSIS — Z87891 Personal history of nicotine dependence: Secondary | ICD-10-CM

## 2021-11-30 DIAGNOSIS — Z1389 Encounter for screening for other disorder: Secondary | ICD-10-CM

## 2021-11-30 DIAGNOSIS — R5383 Other fatigue: Secondary | ICD-10-CM

## 2021-11-30 DIAGNOSIS — Z13220 Encounter for screening for lipoid disorders: Secondary | ICD-10-CM

## 2021-11-30 DIAGNOSIS — R7309 Other abnormal glucose: Secondary | ICD-10-CM

## 2021-11-30 DIAGNOSIS — Z0000 Encounter for general adult medical examination without abnormal findings: Secondary | ICD-10-CM | POA: Diagnosis not present

## 2021-11-30 DIAGNOSIS — Z136 Encounter for screening for cardiovascular disorders: Secondary | ICD-10-CM

## 2021-11-30 DIAGNOSIS — R350 Frequency of micturition: Secondary | ICD-10-CM

## 2021-11-30 DIAGNOSIS — Z125 Encounter for screening for malignant neoplasm of prostate: Secondary | ICD-10-CM | POA: Diagnosis not present

## 2021-11-30 DIAGNOSIS — Z8249 Family history of ischemic heart disease and other diseases of the circulatory system: Secondary | ICD-10-CM | POA: Diagnosis not present

## 2021-11-30 DIAGNOSIS — Z1211 Encounter for screening for malignant neoplasm of colon: Secondary | ICD-10-CM

## 2021-11-30 DIAGNOSIS — E559 Vitamin D deficiency, unspecified: Secondary | ICD-10-CM | POA: Diagnosis not present

## 2021-11-30 DIAGNOSIS — N138 Other obstructive and reflux uropathy: Secondary | ICD-10-CM

## 2021-11-30 DIAGNOSIS — I10 Essential (primary) hypertension: Secondary | ICD-10-CM

## 2021-11-30 DIAGNOSIS — Z131 Encounter for screening for diabetes mellitus: Secondary | ICD-10-CM

## 2021-11-30 DIAGNOSIS — E782 Mixed hyperlipidemia: Secondary | ICD-10-CM

## 2021-11-30 DIAGNOSIS — N401 Enlarged prostate with lower urinary tract symptoms: Secondary | ICD-10-CM | POA: Diagnosis not present

## 2021-11-30 DIAGNOSIS — Z0001 Encounter for general adult medical examination with abnormal findings: Secondary | ICD-10-CM

## 2021-11-30 MED ORDER — ASCORBIC ACID 500 MG PO TABS: ORAL_TABLET | ORAL | Status: AC

## 2021-11-30 MED ORDER — VITAMIN D 125 MCG (5000 UT) PO CAPS: ORAL_CAPSULE | ORAL | Status: AC

## 2021-11-30 MED ORDER — ONE-A-DAY MENS PO TABS: ORAL_TABLET | ORAL | 0 refills | Status: AC

## 2021-12-02 NOTE — Progress Notes (Signed)
<><><><><><><><><><><><><><><><><><><><><><><><><><><><><><><><><> <><><><><><><><><><><><><><><><><><><><><><><><><><><><><><><><><> -   Test results slightly outside the reference range are not unusual. If there is anything important, I will review this with you,  otherwise it is considered normal test values.  If you have further questions,  please do not hesitate to contact me at the office or via My Chart.  <><><><><><><><><><><><><><><><><><><><><><><><><><><><><><><><><> <><><><><><><><><><><><><><><><><><><><><><><><><><><><><><><><><>  -  PSA - Low  - Excellent  <><><><><><><><><><><><><><><><><><><><><><><><><><><><><><><><><>  -   Total Chol  134   &   LDL Chol = 73  - Both  Excellent   - Very low risk for Heart Attack  / Stroke <><><><><><><><><><><><><><><><><><><><><><><><><><><><><><><><><> <><><><><><><><><><><><><><><><><><><><><><><><><><><><><><><><><>  -  A1c - Normal - No Diabetes  <><><><><><><><><><><><><><><><><><><><><><><><><><><><><><><><><>  -  Vitamin D = 42 - low   - Vitamin D goal is between 70-100.   - Please INCREASE  your Vitamin D 5,000 unit caps to 2 caps = 10,000 units /daily  - It is very important as a natural anti-inflammatory and helping the  immune system protect against viral infections, like the Covid-19    helping hair, skin, and nails, as well as reducing stroke and  heart attack risk.   - It helps your bones and helps with mood.  - It also decreases numerous cancer risks so please  take it as directed.   - Low Vit D is associated with a 200-300% higher risk for  CANCER   and 200-300% higher risk for HEART   ATTACK  &  STROKE.    - It is also associated with higher death rate at younger ages,   autoimmune diseases like Rheumatoid arthritis, Lupus,  Multiple Sclerosis.     - Also many other serious conditions, like depression, Alzheimer's  Dementia, infertility, muscle aches, fatigue, fibromyalgia   <><><><><><><><><><><><><><><><><><><><><><><><><><><><><><><><><> <><><><><><><><><><><><><><><><><><><><><><><><><><><><><><><><><>  -  All Else - CBC - Kidneys - Electrolytes - Liver - Magnesium & Thyroid    - all  Normal / OK <><><><><><><><><><><><><><><><><><><><><><><><><><><><><><><><><> <><><><><><><><><><><><><><><><><><><><><><><><><><><><><><><><><>

## 2021-12-03 LAB — URINALYSIS, ROUTINE W REFLEX MICROSCOPIC
Bilirubin Urine: NEGATIVE
Glucose, UA: NEGATIVE
Hgb urine dipstick: NEGATIVE
Ketones, ur: NEGATIVE
Leukocytes,Ua: NEGATIVE
Nitrite: NEGATIVE
Protein, ur: NEGATIVE
Specific Gravity, Urine: 1.013 (ref 1.001–1.035)
pH: 5.5 (ref 5.0–8.0)

## 2021-12-03 LAB — CBC WITH DIFFERENTIAL/PLATELET
Absolute Monocytes: 624 cells/uL (ref 200–950)
Basophils Absolute: 32 cells/uL (ref 0–200)
Basophils Relative: 0.4 %
Eosinophils Absolute: 130 cells/uL (ref 15–500)
Eosinophils Relative: 1.6 %
HCT: 48.1 % (ref 38.5–50.0)
Hemoglobin: 16.6 g/dL (ref 13.2–17.1)
Lymphs Abs: 1863 cells/uL (ref 850–3900)
MCH: 31.1 pg (ref 27.0–33.0)
MCHC: 34.5 g/dL (ref 32.0–36.0)
MCV: 90.1 fL (ref 80.0–100.0)
MPV: 11.1 fL (ref 7.5–12.5)
Monocytes Relative: 7.7 %
Neutro Abs: 5451 cells/uL (ref 1500–7800)
Neutrophils Relative %: 67.3 %
Platelets: 226 10*3/uL (ref 140–400)
RBC: 5.34 10*6/uL (ref 4.20–5.80)
RDW: 12.8 % (ref 11.0–15.0)
Total Lymphocyte: 23 %
WBC: 8.1 10*3/uL (ref 3.8–10.8)

## 2021-12-03 LAB — HEMOGLOBIN A1C
Hgb A1c MFr Bld: 5.1 % of total Hgb (ref <5.7)
Mean Plasma Glucose: 100 mg/dL
eAG (mmol/L): 5.5 mmol/L

## 2021-12-03 LAB — COMPLETE METABOLIC PANEL WITH GFR
AG Ratio: 2.2 (calc) (ref 1.0–2.5)
ALT: 22 U/L (ref 9–46)
AST: 18 U/L (ref 10–35)
Albumin: 4.6 g/dL (ref 3.6–5.1)
Alkaline phosphatase (APISO): 67 U/L (ref 35–144)
BUN: 17 mg/dL (ref 7–25)
CO2: 27 mmol/L (ref 20–32)
Calcium: 10.3 mg/dL (ref 8.6–10.3)
Chloride: 104 mmol/L (ref 98–110)
Creat: 1.04 mg/dL (ref 0.70–1.28)
Globulin: 2.1 g/dL (calc) (ref 1.9–3.7)
Glucose, Bld: 107 mg/dL — ABNORMAL HIGH (ref 65–99)
Potassium: 4.4 mmol/L (ref 3.5–5.3)
Sodium: 139 mmol/L (ref 135–146)
Total Bilirubin: 0.6 mg/dL (ref 0.2–1.2)
Total Protein: 6.7 g/dL (ref 6.1–8.1)
eGFR: 77 mL/min/{1.73_m2} (ref >=60)

## 2021-12-03 LAB — LIPID PANEL
Cholesterol: 134 mg/dL (ref <200)
HDL: 45 mg/dL (ref >=40)
LDL Cholesterol (Calc): 73 mg/dL (calc)
Non-HDL Cholesterol (Calc): 89 mg/dL (calc) (ref <130)
Total CHOL/HDL Ratio: 3 (calc) (ref <5.0)
Triglycerides: 79 mg/dL (ref <150)

## 2021-12-03 LAB — PSA: PSA: 0.42 ng/mL (ref <=4.00)

## 2021-12-03 LAB — MAGNESIUM: Magnesium: 2.1 mg/dL (ref 1.5–2.5)

## 2021-12-03 LAB — MICROALBUMIN / CREATININE URINE RATIO
Creatinine, Urine: 67 mg/dL (ref 20–320)
Microalb, Ur: <0.2 mg/dL

## 2021-12-03 LAB — TSH: TSH: 2.58 mIU/L (ref 0.40–4.50)

## 2021-12-03 LAB — VITAMIN D 25 HYDROXY (VIT D DEFICIENCY, FRACTURES): Vit D, 25-Hydroxy: 42 ng/mL (ref 30–100)

## 2021-12-03 LAB — INSULIN, RANDOM: Insulin: 12.6 u[IU]/mL

## 2022-02-25 ENCOUNTER — Ambulatory Visit: Payer: BC Managed Care – PPO | Admitting: Nurse Practitioner

## 2022-02-25 ENCOUNTER — Encounter: Payer: Self-pay | Admitting: Nurse Practitioner

## 2022-02-25 ENCOUNTER — Other Ambulatory Visit: Payer: Self-pay

## 2022-02-25 VITALS — HR 92 | Temp 97.3°F

## 2022-02-25 DIAGNOSIS — Z1152 Encounter for screening for COVID-19: Secondary | ICD-10-CM | POA: Diagnosis not present

## 2022-02-25 DIAGNOSIS — U071 COVID-19: Secondary | ICD-10-CM

## 2022-02-25 LAB — POC COVID19 BINAXNOW: SARS Coronavirus 2 Ag: POSITIVE — AB

## 2022-02-25 MED ORDER — DEXAMETHASONE 1 MG PO TABS: ORAL_TABLET | ORAL | 0 refills | Status: DC

## 2022-02-25 MED ORDER — AZITHROMYCIN 250 MG PO TABS: ORAL_TABLET | ORAL | 1 refills | Status: DC

## 2022-02-25 MED ORDER — MOLNUPIRAVIR EUA 200MG CAPSULE: 4.0000 | ORAL_CAPSULE | Freq: Two times a day (BID) | ORAL | 0 refills | Status: AC

## 2022-02-25 NOTE — Progress Notes (Signed)
THIS ENCOUNTER IS A VIRTUAL VISIT DUE TO COVID-19 - PATIENT WAS NOT SEEN IN THE OFFICE.  PATIENT HAS CONSENTED TO VIRTUAL VISIT / TELEMEDICINE VISIT   Virtual Visit via telephone Note  I connected with  Perry Evans on 02/25/2022 by telephone.  I verified that I am speaking with the correct person using two identifiers.    I discussed the limitations of evaluation and management by telemedicine and the availability of in person appointments. The patient expressed understanding and agreed to proceed.  History of Present Illness:  Pulse 92   Temp (!) 97.3 F (36.3 C)   SpO2 99%  71 y.o. patient contacted office reporting URI sx . he tested positive by test at office parking lot today. OV was conducted by telephone to minimize exposure. This patient was vaccinated for covid 19, last 2021 Immunization History  Administered Date(s) Administered   DT (Pediatric) 05/13/1996   Hepatitis A 05/13/2009   Influenza, High Dose Seasonal PF 02/23/2020   Influenza-Unspecified 02/10/2018   PPD Test 06/03/2013, 07/19/2014, 09/16/2016   Pneumococcal Conjugate-13 11/02/2018   Td 11/02/2018   Tdap 08/26/2008      Sx began 3 days ago with fever, chills, congestion, productive cough of green mucus, headaches, body aches.  Treatments tried so far: Aleve, cough syrup  Exposures: wife   Medications   Current Outpatient Medications (Cardiovascular):    sildenafil (VIAGRA) 100 MG tablet, Take     1/2 to 1 tablet     Daily       as needed for XXXX     Current Outpatient Medications (Other):    ascorbic acid (VITAMIN C) 500 MG tablet, Takes 1 tablet Daily   Cholecalciferol (VITAMIN D) 125 MCG (5000 UT) CAPS, Takes 1 capsule Daily   multivitamin (ONE-A-DAY MEN'S) TABS tablet, Takes 1 tablet  Daily   Omega-3 Fatty Acids (OMEGA 3 PO), Take by mouth.   TURMERIC PO, Take 1 tablet by mouth daily.   zinc gluconate 50 MG tablet, Take 50 mg by mouth daily.  Allergies:  Allergies  Allergen  Reactions   Codeine     Problem list He has Essential hypertension; Mixed hyperlipidemia; Abnormal glucose; Vitamin D deficiency; Medication management; Aortic atherosclerosis (HCC) bby Lumbar Xrays 2021; Seasonal allergies; Acute bronchitis; Fatigue; and Erectile dysfunction on their problem list.   Social History:   reports that he quit smoking about 23 years ago. His smoking use included cigarettes. He has never used smokeless tobacco. He reports that he does not drink alcohol and does not use drugs.  Observations/Objective:  General : Well sounding patient in no apparent distress HEENT: no hoarseness, no cough for duration of visit Lungs: speaks in complete sentences, no audible wheezing, no apparent distress Neurological: alert, oriented x 3 Psychiatric: pleasant, judgement appropriate   Assessment and Plan:  Covid 19 Covid 19 positive per rapid screening test  Risk factors include: has Essential hypertension; Mixed hyperlipidemia; Abnormal glucose; Vitamin D deficiency; Medication management; Aortic atherosclerosis (HCC) bby Lumbar Xrays 2021; Seasonal allergies; Acute bronchitis; Fatigue; and Erectile dysfunction on their problem list.  Symptoms are: mild Due to co morbid conditions and risk factors, discussed antivirals  Immue support reviewed  Take tylenol PRN temp 101+ Push hydration Regular ambulation or calf exercises exercises for clot prevention and 81 mg ASA unless contraindicated Sx supportive therapy suggested Follow up via mychart or telephone if needed Advised patient obtain O2 monitor; present to ED if persistently <90% or with severe dyspnea, CP, fever uncontrolled by tylenol,  confusion, sudden decline Should remain in isolation until at least 5 days from onset of sx, 24-48 hours fever free without tylenol, sx such as cough are improved.   [name] was seen today for acute visit.  Diagnoses and all orders for this visit:  Encounter for screening for  COVID-19 -     POC COVID-19  COVID-19 -     POC COVID-19 -     azithromycin (ZITHROMAX) 250 MG tablet; Take 2 tablets (500 mg) on  Day 1,  followed by 1 tablet (250 mg) once daily on Days 2 through 5. -     dexamethasone (DECADRON) 1 MG tablet; Take 3 tabs for 3 days, 2 tabs for 3 days 1 tab for 5 days. Take with food. -     molnupiravir EUA (LAGEVRIO) 200 mg CAPS capsule; Take 4 capsules (800 mg total) by mouth 2 (two) times daily for 5 days.      Follow Up Instructions:  I discussed the assessment and treatment plan with the patient. The patient was provided an opportunity to ask questions and all were answered. The patient agreed with the plan and demonstrated an understanding of the instructions.   The patient was advised to call back or seek an in-person evaluation if the symptoms worsen or if the condition fails to improve as anticipated.  I provided 20 minutes of non-face-to-face time during this encounter.   Alycia Rossetti, NP

## 2022-02-26 ENCOUNTER — Ambulatory Visit: Payer: BC Managed Care – PPO | Admitting: Nurse Practitioner

## 2022-03-06 ENCOUNTER — Ambulatory Visit: Payer: BC Managed Care – PPO | Admitting: Nurse Practitioner

## 2022-06-28 ENCOUNTER — Ambulatory Visit (INDEPENDENT_AMBULATORY_CARE_PROVIDER_SITE_OTHER): Payer: BC Managed Care – PPO | Admitting: Internal Medicine

## 2022-06-28 ENCOUNTER — Encounter: Payer: Self-pay | Admitting: Internal Medicine

## 2022-06-28 VITALS — BP 118/80 | HR 75 | Temp 97.9°F | Resp 17 | Ht 69.0 in | Wt 204.8 lb

## 2022-06-28 DIAGNOSIS — J40 Bronchitis, not specified as acute or chronic: Secondary | ICD-10-CM

## 2022-06-28 DIAGNOSIS — Z1152 Encounter for screening for COVID-19: Secondary | ICD-10-CM | POA: Diagnosis not present

## 2022-06-28 DIAGNOSIS — G4483 Primary cough headache: Secondary | ICD-10-CM | POA: Diagnosis not present

## 2022-06-28 DIAGNOSIS — J0140 Acute pansinusitis, unspecified: Secondary | ICD-10-CM

## 2022-06-28 LAB — POC COVID19 BINAXNOW: SARS Coronavirus 2 Ag: NEGATIVE

## 2022-06-28 LAB — POC INFLUENZA A&B (BINAX/QUICKVUE)
Influenza A, POC: NEGATIVE
Influenza B, POC: NEGATIVE

## 2022-06-28 MED ORDER — DEXAMETHASONE 4 MG PO TABS: ORAL_TABLET | ORAL | 0 refills | Status: DC

## 2022-06-28 MED ORDER — AZITHROMYCIN 250 MG PO TABS: ORAL_TABLET | ORAL | 1 refills | Status: DC

## 2022-06-28 NOTE — Progress Notes (Signed)
     Future Appointments  Date Time Provider Department  06/28/2022 11:30 AM Lucky Cowboy, MD [facility]  12/05/2022 11:00 AM Lucky Cowboy, MD [facility]    History of Present Illness:    The patient is a very  nice 72 y.o. MWM with  HTN, HLD, T glucose intolerance and Vitamin D Deficiency who presents with a 3-4 day prodrome of head/sinus & chest congestion, sore throat. No fever chills , sweats or dyspnea . Cough is minimally productive of a yellowish green sputum.  Covid & Flu A/B tests are Negative.    Current Outpatient Medications on File Prior to Visit  Medication Sig   VITAMIN C 500 MG  Takes 1 tablet Daily   VITAMIN D 5000 u Takes 1 capsule Daily   multivitamin  Takes 1 tablet  Daily   OMEGA 3 Take by mouth.   sildenafil  100 MG  Take   1/2 to 1 tablet Daily  needed   TURMERIC  Take 1 tablet daily.   zinc 50 MG tablet Take daily.     Allergies  Allergen Reactions   Codeine      Problem list He has Essential hypertension; Mixed hyperlipidemia; Abnormal glucose; Vitamin D deficiency; Medication management; Aortic atherosclerosis (HCC) bby Lumbar Xrays 2021; Seasonal allergies; Acute bronchitis; Fatigue; and Erectile dysfunction on their problem list.   Observations/Objective:  BP 118/80   Pulse 75   Temp 97.9 F (36.6 C)   Resp 17   Ht 5\' 9"  (1.753 m)   Wt 204 lb 12.8 oz (92.9 kg)   SpO2 98%   BMI 30.24 kg/m   Brassy cough. No Stridor.  Skin -  w/o cyanosis, rash or pallor .  HEENT - EACs / TMs - Nl. (+) fronto-maxillary tenderness. NJ/O/P - clear w/o exudate.  Neck - supple w/o  Br, LN or JVD.  Chest - Bilat scattered rales & rhonchi w/o wheezes.  Cor - Nl HS. RRR w/o sig MGR. PP 1(+). No edema. MS- FROM w/o deformities.  Gait Nl. Neuro -  Nl w/o focal abnormalities.   Assessment and Plan:  1. Acute non-recurrent pansinusitis   2. Bronchitis  - dexamethasone (DECADRON) 4 MG tablet;  Take 1 tab 3 x day - 3 days, then 2 x day - 3 days,  then 1 tab daily   Dispense: 20 tablet  - azithromycin (ZITHROMAX) 250 MG tablet;  Take 2 tablets with Food on  Day 1, then 1 tablet Daily    Dispense: 6 each; Refill: 1  3. Cough headache  - POC Influenza A&B (Binax test) -->> Negative - POC COVID-19       Follow Up Instructions:        I discussed the assessment and treatment plan with the patient. The patient was provided an opportunity to ask questions and all were answered. The patient agreed with the plan and demonstrated an understanding of the instructions.       The patient was advised to call back or seek an in-person evaluation if the symptoms worsen or if the condition fails to improve as anticipated.    Marinus Maw, MD

## 2022-06-29 NOTE — Progress Notes (Signed)
<><><><><><><><><><><><><><><><><><><><><><><><><><><><><><><><><> <><><><><><><><><><><><><><><><><><><><><><><><><><><><><><><><><>  -   Flu A  & Flu  B -Both Negative  - Covid Test is (+) Positive  <><><><><><><><><><><><><><><><><><><><><><><><><><><><><><><><><> <><><><><><><><><><><><><><><><><><><><><><><><><><><><><><><><><>

## 2022-07-01 ENCOUNTER — Encounter: Payer: Self-pay | Admitting: Internal Medicine

## 2022-12-05 ENCOUNTER — Encounter: Payer: BC Managed Care – PPO | Admitting: Internal Medicine

## 2022-12-19 ENCOUNTER — Other Ambulatory Visit: Payer: Self-pay | Admitting: Internal Medicine

## 2022-12-19 DIAGNOSIS — J40 Bronchitis, not specified as acute or chronic: Secondary | ICD-10-CM

## 2022-12-19 NOTE — Progress Notes (Signed)
Annual  Screening/Preventative Visit  & Comprehensive Evaluation & Examination   Future Appointments  Date Time Provider Department  12/20/2022 10:00 AM Lucky Cowboy, MD [facility]  01/01/2024 10:00 AM Lucky Cowboy, MD [facility]         This very nice 72 y.o. MWM   with  HTN, HLD, T glucose intolerance and Vitamin D Deficiency  presents for a Screening /Preventative Visit & comprehensive evaluation and management of multiple medical co-morbidities.  Lumbar Xrays in 2021 showed Aortic Atherosclerosis.        Patient also c/o several day prodrome of sinus & chest congestion & mucopurulent PND & sputum.         Labile HTN predates circa 2007. Patient's BP has been controlled at home.  Today's BP is at goal -  134/74.  Patient denies any cardiac symptoms as chest pain, palpitations, shortness of breath, dizziness or ankle swelling.       Patient's hyperlipidemia is controlled with diet and medications. Patient denies myalgias or other medication SE's. Last lipids were at goal except elevated Trig's :  Lab Results  Component Value Date   CHOL 134 11/30/2021   HDL 45 11/30/2021   LDLCALC 73 11/30/2021   TRIG 79 11/30/2021   CHOLHDL 3.0 11/30/2021         Patient is followed expectantly for glucose intolerance  and patient denies reactive hypoglycemic symptoms, visual blurring, diabetic polys or paresthesias. Last A1c was normal & at goal:   Lab Results  Component Value Date   HGBA1C 5.1 11/30/2021         Finally, patient has history of Vitamin D Deficiency ("32" /2010) and last vitamin D was at goal:   Lab Results  Component Value Date   VD25OH 42 11/30/2021      Current Outpatient Medications  Medication Instructions   VITAMIN C  500 MG tablet Takes 1 tablet Daily   azithromycin 250 MG tablet  (8/8)  TAKE as directed   VITAMIN D 5000 u Takes 1 capsule Daily   multivitamin  Takes 1 tablet  Daily   Omega-3 Fatty Acids  daily    TURMERIC 1 tablet   Daily    Zinc  50 mg    Daily     Allergies  Allergen Reactions   Codeine      Past Medical History:  Diagnosis Date   Arthritis    Cataract    Hearing aid worn      Health Maintenance  Topic Date Due   COVID-19 Vaccine (1) Never done   Zoster Vaccines- Shingrix (1 of 2) Never done   PNA vac  (2 of 2 - PPSV23) 11/02/2019   INFLUENZA VACCINE  12/11/2020   Fecal DNA (Cologuard)  11/23/2021   TETANUS/TDAP  11/01/2028   Hepatitis C Screening  Completed   HPV VACCINES  Aged Out     Immunization History  Administered Date(s) Administered   DT (Pediatric) 05/13/1996   Hepatitis A 05/13/2009   Influenza, High Dose  02/23/2020   Influenza 02/10/2018   PPD Test 09/16/2016   Pneumococcal -13 11/02/2018   Td 11/02/2018   Tdap 08/26/2008    Last Colon - 06/06/2005 - Dr Arlyce Dice and he's aware that he's overdue 10 yr recall. He was sent a 10 year recall letter in 2017 by Dr Myrtie Neither.  Cologard 11/2018 - Negative - Recc 3 year f/u  July 2023   Past Surgical History:  Procedure Laterality Date   EYE SURGERY  PANCREAS SURGERY       Family History  Problem Relation Age of Onset   Heart disease Father 58       killed   Heart disease Paternal Aunt 52   Heart disease Paternal Uncle        3 uncles all died 63-70   Heart disease Paternal Grandfather 39       MI died    Social History   Socioeconomic History   Marital status: Married    Spouse name: Not on file   Number of children: Not on file   Years of education: Not on file   Highest education level: Not on file  Occupational History   Not on file  Tobacco Use   Smoking status: Former    Pack years: 0.00    Types: Cigarettes    Quit date: 05/13/1998    Years since quitting: 22.5   Smokeless tobacco: Never  Vaping Use   Vaping Use: Never used  Substance and Sexual Activity   Alcohol use: No   Drug use: No   Sexual activity: Not on file  Other Topics Concern   Not on file  Social History Narrative    Employment: works in Teacher, English as a foreign language for [employer].     ROS Constitutional: Denies fever, chills, weight loss/gain, headaches, insomnia,  night sweats or change in appetite. Does c/o fatigue. Eyes: Denies redness, blurred vision, diplopia, discharge, itchy or watery eyes.  ENT: Denies discharge, congestion, post nasal drip, epistaxis, sore throat, earache, hearing loss, dental pain, Tinnitus, Vertigo, Sinus pain or snoring.  Cardio: Denies chest pain, palpitations, irregular heartbeat, syncope, dyspnea, diaphoresis, orthopnea, PND, claudication or edema Respiratory: denies cough, dyspnea, DOE, pleurisy, hoarseness, laryngitis or wheezing.  Gastrointestinal: Denies dysphagia, heartburn, reflux, water brash, pain, cramps, nausea, vomiting, bloating, diarrhea, constipation, hematemesis, melena, hematochezia, jaundice or hemorrhoids Genitourinary: Denies dysuria, frequency, urgency, nocturia, hesitancy, discharge, hematuria or flank pain Musculoskeletal: Denies arthralgia, myalgia, stiffness, Jt. Swelling, pain, limp or strain/sprain. Denies Falls. Skin: Denies puritis, rash, hives, warts, acne, eczema or change in skin lesion Neuro: No weakness, tremor, incoordination, spasms, paresthesia or pain Psychiatric: Denies confusion, memory loss or sensory loss. Denies Depression. Endocrine: Denies change in weight, skin, hair change, nocturia, and paresthesia, diabetic polys, visual blurring or hyper / hypo glycemic episodes.  Heme/Lymph: No excessive bleeding, bruising or enlarged lymph nodes.   Physical Exam  BP 134/74   Pulse 80   Temp 97.9 F (36.6 C)   Resp 17   Ht 5\' 9"  (1.753 m)   Wt 196 lb (88.9 kg)   SpO2 98%   BMI 28.94 kg/m   General Appearance: Well nourished and well groomed and in no apparent distress.  Eyes: PERRLA, EOMs, conjunctiva no swelling or erythema, normal fundi and vessels. Sinuses: Slight  frontal/maxillary tenderness ENT/Mouth: EACs patent /  TMs  nl. Nares clear without erythema, swelling, mucoid exudates. Oral hygiene is good. No erythema, swelling, or exudate. Tongue normal, non-obstructing. Tonsils not swollen or erythematous. Hearing normal.  Neck: Supple, thyroid not palpable. No bruits, nodes or JVD. Respiratory: Respiratory effort normal.  BS equal with few scattered rales & rhonchi, no wheezing or stridor. Cardio: Heart sounds are normal with regular rate and rhythm and no murmurs, rubs or gallops. Peripheral pulses are normal and equal bilaterally without edema. No aortic or femoral bruits. Chest: symmetric with normal excursions and percussion.  Abdomen: Soft, with Nl bowel sounds. Nontender, no guarding, rebound, hernias, masses, or organomegaly.  Lymphatics: Non  tender without lymphadenopathy.  Musculoskeletal: Full ROM all peripheral extremities, joint stability, 5/5 strength, and normal gait. Skin: Warm and dry without rashes, lesions, cyanosis, clubbing or  ecchymosis.  Neuro: Cranial nerves intact, reflexes equal bilaterally. Normal muscle tone, no cerebellar symptoms. Sensation intact.  Pysch: Alert and oriented X 3 with normal affect, insight and judgment appropriate.   Assessment and Plan  1. Annual Preventative/Screening Exam    2. Labile hypertension  - EKG 12-Lead - [place], RETROPERITNL ABD,  LTD - Urinalysis, Routine w reflex microscopic - Microalbumin / creatinine urine ratio  3. Hyperlipidemia, mixed  - EKG 12-Lead - [place], RETROPERITNL ABD,  LTD  4. Abnormal glucose  - EKG 12-Lead - [place], RETROPERITNL ABD,  LTD  5. Vitamin D deficiency  - VITAMIN D 25 Hydroxy   6. Aortic atherosclerosis (HCC) bby Lumbar Xrays 2021  - EKG 12-Lead - [place], RETROPERITNL ABD,  LTD - Lipid panel  7. BPH with obstruction/lower urinary tract symptoms  - PSA  8. Screening for colorectal cancer  - POC Hemoccult Bld/Stl   9. Prostate cancer screening  - PSA  10. Screening for heart disease  - EKG  12-Lead  11. FHx: heart disease  - EKG 12-Lead - [place], RETROPERITNL ABD,  LTD  12. Former smoker  - EKG 12-Lead - [place], RETROPERITNL ABD,  LTD  13. Screening for AAA (aortic abdominal aneurysm)  - [place], RETROPERITNL ABD,  LTD  14. Fatigue - CBC with Differential/Platelet - TSH  15. Medication management  - CBC with Differential/Platelet - COMPLETE METABOLIC PANEL WITH GFR - Magnesium - Lipid panel - TSH - Hemoglobin A1c - Insulin, random - VITAMIN D 25 Hydroxy          Patient was counseled in prudent diet, weight control to achieve/maintain BMI less than 25, BP monitoring, regular exercise and medications as discussed.  Discussed med effects and SE's. Routine screening labs and tests as requested with regular follow-up as recommended. Over 40 minutes of exam, counseling, chart review and high complex critical decision making was performed   Marinus Maw, MD

## 2022-12-19 NOTE — Patient Instructions (Signed)

## 2022-12-20 ENCOUNTER — Ambulatory Visit (INDEPENDENT_AMBULATORY_CARE_PROVIDER_SITE_OTHER): Payer: Medicare Other | Admitting: Internal Medicine

## 2022-12-20 ENCOUNTER — Encounter: Payer: Self-pay | Admitting: Internal Medicine

## 2022-12-20 VITALS — BP 134/74 | HR 80 | Temp 97.9°F | Resp 17 | Ht 69.0 in | Wt 196.0 lb

## 2022-12-20 DIAGNOSIS — Z1211 Encounter for screening for malignant neoplasm of colon: Secondary | ICD-10-CM

## 2022-12-20 DIAGNOSIS — R5383 Other fatigue: Secondary | ICD-10-CM

## 2022-12-20 DIAGNOSIS — J329 Chronic sinusitis, unspecified: Secondary | ICD-10-CM

## 2022-12-20 DIAGNOSIS — N401 Enlarged prostate with lower urinary tract symptoms: Secondary | ICD-10-CM

## 2022-12-20 DIAGNOSIS — Z136 Encounter for screening for cardiovascular disorders: Secondary | ICD-10-CM

## 2022-12-20 DIAGNOSIS — N529 Male erectile dysfunction, unspecified: Secondary | ICD-10-CM

## 2022-12-20 DIAGNOSIS — R0989 Other specified symptoms and signs involving the circulatory and respiratory systems: Secondary | ICD-10-CM | POA: Diagnosis not present

## 2022-12-20 DIAGNOSIS — Z0001 Encounter for general adult medical examination with abnormal findings: Secondary | ICD-10-CM

## 2022-12-20 DIAGNOSIS — E559 Vitamin D deficiency, unspecified: Secondary | ICD-10-CM

## 2022-12-20 DIAGNOSIS — Z87891 Personal history of nicotine dependence: Secondary | ICD-10-CM

## 2022-12-20 DIAGNOSIS — E782 Mixed hyperlipidemia: Secondary | ICD-10-CM

## 2022-12-20 DIAGNOSIS — I700 Atherosclerosis of aorta: Secondary | ICD-10-CM

## 2022-12-20 DIAGNOSIS — Z8249 Family history of ischemic heart disease and other diseases of the circulatory system: Secondary | ICD-10-CM

## 2022-12-20 DIAGNOSIS — N138 Other obstructive and reflux uropathy: Secondary | ICD-10-CM

## 2022-12-20 DIAGNOSIS — Z125 Encounter for screening for malignant neoplasm of prostate: Secondary | ICD-10-CM

## 2022-12-20 DIAGNOSIS — R7309 Other abnormal glucose: Secondary | ICD-10-CM

## 2022-12-20 DIAGNOSIS — Z79899 Other long term (current) drug therapy: Secondary | ICD-10-CM

## 2022-12-20 MED ORDER — DEXAMETHASONE 4 MG PO TABS: ORAL_TABLET | ORAL | 0 refills | Status: DC

## 2022-12-20 MED ORDER — PSEUDOEPHEDRINE HCL ER 120 MG PO TB12: ORAL_TABLET | ORAL | 3 refills | Status: DC

## 2022-12-20 MED ORDER — TADALAFIL 20 MG PO TABS: ORAL_TABLET | ORAL | 3 refills | Status: DC

## 2022-12-21 NOTE — Progress Notes (Signed)
^<^<^<^<^<^<^<^<^<^<^<^<^<^<^<^<^<^<^<^<^<^<^<^<^<^<^<^<^<^<^<^<^<^<^<^<^ ^>^>^>^>^>^>^>^>^>^>^>>^>^>^>^>^>^>^>^>^>^>^>^>^>^>^>^>^>^>^>^>^>^>^>^>^>  -  Test results slightly outside the reference range are not unusual. If there is anything important, I will review this with you,  otherwise it is considered normal test values.  If you have further questions,  please do not hesitate to contact me at the office or via My Chart.   ^<^<^<^<^<^<^<^<^<^<^<^<^<^<^<^<^<^<^<^<^<^<^<^<^<^<^<^<^<^<^<^<^<^<^<^<^ ^>^>^>^>^>^>^>^>^>^>^>^>^>^>^>^>^>^>^>^>^>^>^>^>^>^>^>^>^>^>^>^>^>^>^>^>^  -   Chol == 86    - Both  Excellent   - Very low risk for Heart Attack  / Stroke  ^>^>^>^>^>^>^>^>^>^>^>^>^>^>^>^>^>^>^>^>^>^>^>^>^>^>^>^>^>^>^>^>^>^>^>^>^ ^>^>^>^>^>^>^>^>^>^>^>^>^>^>^>^>^>^>^>^>^>^>^>^>^>^>^>^>^>^>^>^>^>^>^>^>^  -   PSA - No Prostate Cancer - Very low  - Great   !    ^>^>^>^>^>^>^>^>^>^>^>^>^>^>^>^>^>^>^>^>^>^>^>^>^>^>^>^>^>^>^>^>^>^>^>^>^ ^>^>^>^>^>^>^>^>^>^>^>^>^>^>^>^>^>^>^>^>^>^>^>^>^>^>^>^>^>^>^>^>^>^>^>^>^  -   A1c - Normal - No Diabetes  - Great  !   ^>^>^>^>^>^>^>^>^>^>^>^>^>^>^>^>^>^>^>^>^>^>^>^>^>^>^>^>^>^>^>^>^>^>^>^>^ ^>^>^>^>^>^>^>^>^>^>^>^>^>^>^>^>^>^>^>^>^>^>^>^>^>^>^>^>^>^>^>^>^>^>^>^>^  -   Vitamin D= 41 - Is extremely Low   !   - Vitamin D goal is between 70-100.   - Please   INCREASE  your Vitamin D 5,000 u caps to                                                                                2 capsules = 10,000 units Every Day    - It is very important as a natural anti-inflammatory and helping the                           immune system protect against viral infections, like the Covid-19    helping hair, skin, and nails, as well as reducing stroke and heart attack risk.   - It helps your bones and helps with mood.  - It also decreases numerous cancer risks so please                                                                                           take it  as directed.   - Low Vit D is associated with a 200-300% higher risk for CANCER   and 200-300% higher risk for HEART   ATTACK  &  STROKE.    - It is also associated with higher death rate at younger ages,   autoimmune diseases like Rheumatoid arthritis, Lupus, Multiple Sclerosis.     - Also many other serious conditions, like depression, Alzheimer's  Dementia,  muscle aches, fatigue, fibromyalgia   ^>^>^>^>^>^>^>^>^>^>^>^>^>^>^>^>^>^>^>^>^>^>^>^>^>^>^>^>^>^>^>^>^>^>^>^>^ ^>^>^>^>^>^>^>^>^>^>^>^>^>^>^>^>^>^>^>^>^>^>^>^>^>^>^>^>^>^>^>^>^>^>^>^>^  -   All Else - CBC - Kidneys - Electrolytes - Liver - Magnesium & Thyroid    - all  Normal / OK  ^>^>^>^>^>^>^>^>^>^>^>^>^>^>^>^>^>^>^>^>^>^>^>^>^>^>^>^>^>^>^>^>^>^>^>^>^ ^>^>^>^>^>^>^>^>^>^>^>^>^>^>^>^>^>^>^>^>^>^>^>^>^>^>^>^>^>^>^>^>^>^>^>^>^

## 2022-12-24 ENCOUNTER — Other Ambulatory Visit: Payer: Self-pay | Admitting: Internal Medicine

## 2022-12-24 MED ORDER — IPRATROPIUM BROMIDE 0.06 % NA SOLN: NASAL | 2 refills | Status: DC

## 2022-12-25 ENCOUNTER — Other Ambulatory Visit: Payer: Self-pay | Admitting: Internal Medicine

## 2022-12-25 MED ORDER — PREDNISONE 20 MG PO TABS: ORAL_TABLET | ORAL | 1 refills | Status: DC

## 2023-01-21 ENCOUNTER — Other Ambulatory Visit: Payer: Self-pay | Admitting: Internal Medicine

## 2023-01-21 MED ORDER — IPRATROPIUM BROMIDE 0.06 % NA SOLN: NASAL | 3 refills | Status: DC

## 2023-01-21 NOTE — Progress Notes (Signed)
<><><><><><><><><><><><><><><><><><><><><><><><><><><><><><><><><> ?<><><><><><><><><><><><><><><><><><><><><><><><><><><><><><><><><> ? ?-   Cologard  - Negative  - Great !                                                           -   Recommend repeat in 3 years !  <><><><><><><><><><><><><><><><><><><><><><><><><><><><><><><><><> <><><><><><><><><><><><><><><><><><><><><><><><><><><><><><><><><>  

## 2023-01-22 ENCOUNTER — Other Ambulatory Visit: Payer: Self-pay | Admitting: Internal Medicine

## 2023-01-22 MED ORDER — IPRATROPIUM BROMIDE 0.06 % NA SOLN: NASAL | 3 refills | Status: DC

## 2023-03-11 ENCOUNTER — Ambulatory Visit (INDEPENDENT_AMBULATORY_CARE_PROVIDER_SITE_OTHER): Payer: Medicare PPO | Admitting: Nurse Practitioner

## 2023-03-11 ENCOUNTER — Encounter: Payer: Self-pay | Admitting: Nurse Practitioner

## 2023-03-11 VITALS — BP 122/68 | HR 85 | Temp 97.5°F | Ht 69.0 in | Wt 195.0 lb

## 2023-03-11 DIAGNOSIS — Z1152 Encounter for screening for COVID-19: Secondary | ICD-10-CM

## 2023-03-11 DIAGNOSIS — J069 Acute upper respiratory infection, unspecified: Secondary | ICD-10-CM | POA: Diagnosis not present

## 2023-03-11 DIAGNOSIS — R0989 Other specified symptoms and signs involving the circulatory and respiratory systems: Secondary | ICD-10-CM | POA: Diagnosis not present

## 2023-03-11 DIAGNOSIS — R6889 Other general symptoms and signs: Secondary | ICD-10-CM | POA: Diagnosis not present

## 2023-03-11 LAB — POCT INFLUENZA A/B
Influenza A, POC: NEGATIVE
Influenza B, POC: NEGATIVE

## 2023-03-11 LAB — POC COVID19 BINAXNOW: SARS Coronavirus 2 Ag: NEGATIVE

## 2023-03-11 MED ORDER — PREDNISONE 20 MG PO TABS: ORAL_TABLET | ORAL | 1 refills | Status: DC

## 2023-03-11 MED ORDER — AZITHROMYCIN 250 MG PO TABS: ORAL_TABLET | ORAL | 1 refills | Status: DC

## 2023-03-11 MED ORDER — PROMETHAZINE-DM 6.25-15 MG/5ML PO SYRP: 5.0000 mL | ORAL_SOLUTION | Freq: Four times a day (QID) | ORAL | 1 refills | Status: DC | PRN

## 2023-03-11 NOTE — Progress Notes (Signed)
Assessment and Plan:  Perry Evans was seen today for acute visit.  Diagnoses and all orders for this visit:  Labile hypertension - Currently controlled without medication - continue DASH diet, exercise and monitor at home. Call if greater than 130/80.   Encounter for screening for COVID-19 -     POC COVID-19- negative  Flu-like symptoms -     POCT Influenza A/B- negative  URI, acute Push fluids Use Mucinex DM during the day and Promethazine DM cough syrup at night as needed Use prednisone taper and Zithromax as directed If not improved by early next week notify the office -     azithromycin (ZITHROMAX) 250 MG tablet; Take 2 tablets (500 mg) on  Day 1,  followed by 1 tablet (250 mg) once daily on Days 2 through 5. -     predniSONE (DELTASONE) 20 MG tablet; 1 tab 3 x day for 3 days, then 1 tab 2 x day for 3 days, then 1 tab 1 x day for 5 days -     promethazine-dextromethorphan (PROMETHAZINE-DM) 6.25-15 MG/5ML syrup; Take 5 mLs by mouth 4 (four) times daily as needed for cough.       Further disposition pending results of labs. Discussed med's effects and SE's.   Over 30 minutes of exam, counseling, chart review, and critical decision making was performed.   Future Appointments  Date Time Provider Department Center  03/25/2023  3:30 PM Raynelle Dick, NP [facility] None  01/01/2024 10:00 AM Lucky Cowboy, MD [facility] None    ------------------------------------------------------------------------------------------------------------------   HPI BP 122/68   Pulse 85   Temp (!) 97.5 F (36.4 C)   Ht 5\' 9"  (1.753 m)   Wt 195 lb (88.5 kg)   SpO2 98%   BMI 28.80 kg/m   72 y.o.male presents for complaints of productive cough of green mucus and nasal mucus that is green, fatigue, body aches, sore throat. He was camping over the weekend and was caught in the rain packing up. He used an aspirin and used previous cough syrup he had.   BP well controlled without medication.  Has a history of labile hypertension BP Readings from Last 3 Encounters:  03/11/23 122/68  12/20/22 134/74  06/28/22 118/80  Denies headaches, chest pain, shortness of breath and dizziness   BMI is Body mass index is 28.8 kg/m., he has been working on diet and exercise. Wt Readings from Last 3 Encounters:  03/11/23 195 lb (88.5 kg)  12/20/22 196 lb (88.9 kg)  06/28/22 204 lb 12.8 oz (92.9 kg)    Past Medical History:  Diagnosis Date   Arthritis    Cataract    Hearing aid worn      Allergies  Allergen Reactions   Codeine     Current Outpatient Medications on File Prior to Visit  Medication Sig   ascorbic acid (VITAMIN C) 500 MG tablet Takes 1 tablet Daily   aspirin EC 81 MG tablet Take 81 mg by mouth daily. Swallow whole.   Cholecalciferol (VITAMIN D) 125 MCG (5000 UT) CAPS Takes 1 capsule Daily   multivitamin (ONE-A-DAY MEN'S) TABS tablet Takes 1 tablet  Daily   Omega-3 Fatty Acids (OMEGA 3 PO) Take by mouth.   tadalafil (CIALIS) 20 MG tablet Take 1/2 to 1 tablet every 2 to 3 days as needed for XXXX   zinc gluconate 50 MG tablet Take 50 mg by mouth daily.   ipratropium (ATROVENT) 0.06 % nasal spray Use  1 to 2 sprays  each  Nostril 3 x / day  for VasoMotor Rhinitis  (Runny nose) (Patient not taking: Reported on 03/11/2023)   predniSONE (DELTASONE) 20 MG tablet 1 tab 3 x day for 3 days, then 1 tab 2 x day for 3 days, then 1 tab 1 x day for 5 days (Patient not taking: Reported on 03/11/2023)   pseudoephedrine (SUDAFED) 120 MG 12 hr tablet Take  1 tablet  2 x /day (every 12 hours)  for Sinus & Chest Congestion (Patient not taking: Reported on 03/11/2023)   TURMERIC PO Take 1 tablet by mouth daily. (Patient not taking: Reported on 03/11/2023)   No current facility-administered medications on file prior to visit.    ROS: all negative except above.   Physical Exam:  BP 122/68   Pulse 85   Temp (!) 97.5 F (36.4 C)   Ht 5\' 9"  (1.753 m)   Wt 195 lb (88.5 kg)   SpO2 98%    BMI 28.80 kg/m   General Appearance: Well nourished, in no apparent distress. Eyes: PERRLA, EOMs, conjunctiva no swelling or erythema Sinuses: Positive maxillary tenderness ENT/Mouth: Ext aud canals clear, TMs with mild erythema, no bulging. No erythema, swelling, or exudate on post pharynx.  Hearing normal.  Neck: Supple, thyroid normal.  Respiratory: Respiratory effort normal, BS equal bilaterally without rales, rhonchi, wheezing or stridor.  Cardio: RRR with no MRGs. Brisk peripheral pulses without edema.  Abdomen: Soft, + BS.  Non tender, no guarding, rebound, hernias, masses. Lymphatics: Tender submandibular adenopathy bilaterally Musculoskeletal: Full ROM, 5/5 strength, normal gait.  Skin: Warm, dry without rashes, lesions, ecchymosis.  Neuro: Cranial nerves intact. Normal muscle tone, no cerebellar symptoms. Sensation intact.  Psych: Awake and oriented X 3, normal affect, Insight and Judgment appropriate.     Raynelle Dick, NP 2:54 PM [facility]

## 2023-03-11 NOTE — Patient Instructions (Addendum)
Push fluids Use Mucinex DM during the day and Promethazine DM cough syrup at night as needed Use prednisone taper and Zithromax as directed If not improved by early next week notify the office  Upper Respiratory Infection, Adult An upper respiratory infection (URI) affects the nose, throat, and upper airways that lead to the lungs. The most common type of URI is often called the common cold. URIs usually get better on their own, without medical treatment. What are the causes? A URI is caused by a germ (virus). You may catch these germs by: Breathing in droplets from an infected person's cough or sneeze. Touching something that has the germ on it (is contaminated) and then touching your mouth, nose, or eyes. What increases the risk? You are more likely to get a URI if: You are very young or very old. You have close contact with others, such as at work, school, or a health care facility. You smoke. You have long-term (chronic) heart or lung disease. You have a weakened disease-fighting system (immune system). You have nasal allergies or asthma. You have a lot of stress. You have poor nutrition. What are the signs or symptoms? Runny or stuffy (congested) nose. Cough. Sneezing. Sore throat. Headache. Feeling tired (fatigue). Fever. Not wanting to eat as much as usual. Pain in your forehead, behind your eyes, and over your cheekbones (sinus pain). Muscle aches. Redness or irritation of the eyes. Pressure in the ears or face. How is this treated? URIs usually get better on their own within 7-10 days. Medicines cannot cure URIs, but your doctor may recommend certain medicines to help relieve symptoms, such as: Over-the-counter cold medicines. Medicines to reduce coughing (cough suppressants). Coughing is a type of defense against infection that helps to clear the nose, throat, windpipe, and lungs (respiratory system). Take these medicines only as told by your doctor. Medicines to lower  your fever. Follow these instructions at home: Activity Rest as needed. If you have a fever, stay home from work or school until your fever is gone, or until your doctor says you may return to work or school. You should stay home until you cannot spread the infection anymore (you are not contagious). Your doctor may have you wear a face mask so you have less risk of spreading the infection. Relieving symptoms Rinse your mouth often with salt water. To make salt water, dissolve -1 tsp (3-6 g) of salt in 1 cup (237 mL) of warm water. Use a cool-mist humidifier to add moisture to the air. This can help you breathe more easily. Eating and drinking  Drink enough fluid to keep your pee (urine) pale yellow. Eat soups and other clear broths. General instructions  Take over-the-counter and prescription medicines only as told by your doctor. Do not smoke or use any products that contain nicotine or tobacco. If you need help quitting, ask your doctor. Avoid being where people are smoking (avoid secondhand smoke). Stay up to date on all your shots (immunizations), and get the flu shot every year. Keep all follow-up visits. How to prevent the spread of infection to others  Wash your hands with soap and water for at least 20 seconds. If you cannot use soap and water, use hand sanitizer. Avoid touching your mouth, face, eyes, or nose. Cough or sneeze into a tissue or your sleeve or elbow. Do not cough or sneeze into your hand or into the air. Contact a doctor if: You are getting worse, not better. You have any of these:  A fever or chills. Brown or red mucus in your nose. Yellow or brown fluid (discharge)coming from your nose. Pain in your face, especially when you bend forward. Swollen neck glands. Pain when you swallow. White areas in the back of your throat. Get help right away if: You have shortness of breath that gets worse. You have very bad or constant: Headache. Ear pain. Pain in  your forehead, behind your eyes, and over your cheekbones (sinus pain). Chest pain. You have long-lasting (chronic) lung disease along with any of these: Making high-pitched whistling sounds when you breathe, most often when you breathe out (wheezing). Long-lasting cough (more than 14 days). Coughing up blood. A change in your usual mucus. You have a stiff neck. You have changes in your: Vision. Hearing. Thinking. Mood. These symptoms may be an emergency. Get help right away. Call 911. Do not wait to see if the symptoms will go away. Do not drive yourself to the hospital. Summary An upper respiratory infection (URI) is caused by a germ (virus). The most common type of URI is often called the common cold. URIs usually get better within 7-10 days. Take over-the-counter and prescription medicines only as told by your doctor. This information is not intended to replace advice given to you by your health care provider. Make sure you discuss any questions you have with your health care provider. Document Revised: 11/29/2020 Document Reviewed: 11/29/2020 Elsevier Patient Education  2024 [employer].

## 2023-03-24 NOTE — Progress Notes (Unsigned)
WELCOME TO MEDICARE AND FOLLOW UP  Assessment and Plan:   Welcome to Medicare Due annually Cologuard negative 01/2023  Aortic Atherosclerosis(HCC) Continue control of blood pressure, blood sugars, cholesterol and weight  Essential hypertension -     CBC with Diff -     COMPLETE METABOLIC PANEL WITH GFR - controlled without medications, DASH diet, exercise and monitor at home. Call if greater than 130/80.  Go to the ER if any chest pain, shortness of breath, nausea, dizziness, severe HA, changes vision/speech   Mixed hyperlipidemia -     Lipid Profile check lipids decrease fatty foods increase activity.   Abnormal glucose Discussed disease progression and risks Discussed diet/exercise, weight management and risk modification  Obesity Long discussion about weight loss, diet, and exercise Recommended diet heavy in fruits and veggies and low in animal meats, cheeses, and dairy products, appropriate calorie intake Follow up at next visit  Hypercalcemia Avoid TUMS and calcium supplements - CMP  Former Smoker Quit 23 years ago Denies symptoms  Vitamin D deficiency Continue vit d supplementation to maintain therapeutic value between 60-100 Last Vit D 11/08/20 was in range at 53  Testosterone deficiency Continue zinc supplementation  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 Further disposition pending results of labs. Over 30 minutes of exam, counseling, chart review, and critical decision making was performed  Future Appointments  Date Time Provider Department Center  01/01/2024 10:00 AM Lucky Cowboy, MD [facility] None    Plan:   During the course of the visit the patient was educated and counseled about appropriate  screening and preventive services including:   Pneumococcal vaccine  Prevnar 13 Influenza vaccine Td vaccine Screening electrocardiogram Bone densitometry screening Colorectal cancer screening Diabetes screening Glaucoma screening Nutrition counseling  Advanced directives: requested   HPI 72 y.o. right handed male  presents for Welcome to Medicare and  3 month follow up on hypertension, cholesterol, prediabetes, and vitamin D deficiency.   He is getting up 3-4 times a night to urinate. He urinates a lot throughout the day as well.   His blood pressure has been controlled at home, today their BP is BP: 112/64 BP Readings from Last 3 Encounters:  03/25/23 112/64  03/11/23 122/68  12/20/22 134/74  He does not workout. He denies chest pain, shortness of breath, dizziness.  BMI is Body mass index is 28.71 kg/m., he is working on diet and exercise. 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  Wt Readings from Last 3 Encounters:  03/25/23 194 lb 6.4 oz (88.2 kg)  03/11/23 195 lb (88.5 kg)  12/20/22 196 lb (88.9 kg)    He  is not  on cholesterol medication and denies myalgias. His cholesterol is at goal. The cholesterol last visit was:   Lab Results  Component Value Date   CHOL 147 12/20/2022   HDL 38 (L) 12/20/2022   LDLCALC 86 12/20/2022   TRIG 124 12/20/2022   CHOLHDL 3.9 12/20/2022    Last A1C in the office was:  Lab Results  Component Value Date   HGBA1C 5.6 12/20/2022   Patient is on Vitamin D supplement.   Lab Results  Component Value Date  VD25OH 41 12/20/2022     He has a history of testosterone deficiency and is on zinc and does smoothie with natural ingredients to increase testosterone.  Lab Results  Component Value Date   TESTOSTERONE 160 (L) 05/22/2021     Current Medications:  Current Outpatient Medications on File Prior to Visit  Medication Sig   ascorbic acid (VITAMIN C) 500 MG tablet Takes 1 tablet Daily   aspirin EC  81 MG tablet Take 81 mg by mouth daily. Swallow whole.   Cholecalciferol (VITAMIN D) 125 MCG (5000 UT) CAPS Takes 1 capsule Daily   multivitamin (ONE-A-DAY MEN'S) TABS tablet Takes 1 tablet  Daily   Omega-3 Fatty Acids (OMEGA 3 PO) Take by mouth.   tadalafil (CIALIS) 20 MG tablet Take 1/2 to 1 tablet every 2 to 3 days as needed for XXXX   zinc gluconate 50 MG tablet Take 50 mg by mouth daily.   TURMERIC PO Take 1 tablet by mouth daily. (Patient not taking: Reported on 03/11/2023)   No current facility-administered medications on file prior to visit.    Medical History:  Past Medical History:  Diagnosis Date   Arthritis    Cataract    Hearing aid worn    Allergies:  Allergies  Allergen Reactions   Codeine     Immunization History  Administered Date(s) Administered   DT (Pediatric) 05/13/1996   Hepatitis A 05/13/2009   Influenza, High Dose Seasonal PF 02/23/2020   Influenza-Unspecified 02/10/2018   PPD Test 06/03/2013, 07/19/2014, 09/16/2016   Pneumococcal Conjugate-13 11/02/2018   Td 11/02/2018   Tdap 08/26/2008    Health Maintenance  Topic Date Due   COVID-19 Vaccine (1) Never done   Zoster Vaccines- Shingrix (1 of 2) Never done   Pneumonia Vaccine 54+ Years old (2 of 2 - PPSV23 or PCV20) 11/02/2019   INFLUENZA VACCINE  12/12/2022   Medicare Annual Wellness (AWV)  03/24/2024   Fecal DNA (Cologuard)  01/16/2026   DTaP/Tdap/Td (4 - Td or Tdap) 11/01/2028   Hepatitis C Screening  Completed   HPV VACCINES  Aged Out   Colonoscopy  Discontinued    Eye Doctor: Dr. Dione Booze 11/23- missed appointment and is being rescheduled Dentist: Dr. Lorin Picket 02/2023 . MEDICARE WELLNESS OBJECTIVES: Physical activity:   Cardiac risk factors:   Depression/mood screen:      12/19/2022    6:57 PM  Depression screen PHQ 2/9  Decreased Interest 0  Down, Depressed, Hopeless 0  PHQ - 2 Score 0    ADLs:     12/19/2022    6:56 PM  In your present state of health, do you have any difficulty  performing the following activities:  Hearing? 0  Vision? 0  Difficulty concentrating or making decisions? 0  Walking or climbing stairs? 0  Dressing or bathing? 0  Doing errands, shopping? 0     Cognitive Testing  Alert? Yes  Normal Appearance?Yes  Oriented to person? Yes  Place? Yes   Time? Yes  Recall of three objects?  Yes  Can perform simple calculations? Yes  Displays appropriate judgment?Yes  Can read the correct time from a watch face?Yes  EOL planning:       Review of Systems:  Review of Systems  Constitutional:  Negative for chills, diaphoresis, fever, malaise/fatigue and weight loss.  HENT:  Negative for congestion, hearing loss, sore throat and tinnitus.   Eyes: Negative.  Negative for blurred vision.  Respiratory:  Negative for cough and shortness of breath.  Cardiovascular: Negative.  Negative for chest pain.  Gastrointestinal:  Negative for abdominal pain, constipation, diarrhea, heartburn, nausea and vomiting.  Genitourinary:  Positive for frequency and urgency.  Musculoskeletal:  Positive for back pain and joint pain. Negative for falls, myalgias and neck pain.  Skin: Negative.   Neurological:  Negative for dizziness, tingling, tremors, sensory change, speech change, focal weakness, seizures, loss of consciousness, weakness and headaches.  Psychiatric/Behavioral: Negative.  Negative for depression.     Family history- Review and unchanged Social history- Review and unchanged Physical Exam: BP 112/64   Pulse 90   Temp 97.7 F (36.5 C)   Ht 5\' 9"  (1.753 m)   Wt 194 lb 6.4 oz (88.2 kg)   SpO2 94%   BMI 28.71 kg/m  Wt Readings from Last 3 Encounters:  03/25/23 194 lb 6.4 oz (88.2 kg)  03/11/23 195 lb (88.5 kg)  12/20/22 196 lb (88.9 kg)   General Appearance: Well nourished, in no apparent distress. Eyes: PERRLA, EOMs, conjunctiva no swelling or erythema Sinuses: No Frontal/maxillary tenderness ENT/Mouth: Ext aud canals clear, Tms without bulging  and erythema bilaterally. No erythema, swelling, or exudate on post pharynx.  . Hard of hearing Neck: Supple, thyroid normal.  Respiratory: Respiratory effort normal, BS equal bilaterally without rales, rhonchi, wheezing or stridor.  Cardio: RRR with no MRGs. Brisk peripheral pulses without edema.  Abdomen: Soft, + BS,  Non tender, no guarding, rebound, hernias, masses. Lymphatics: Non tender without lymphadenopathy.  Musculoskeletal: Full ROM, 5/5 strength, gait normal Skin: Warm, dry without rashes, lesions, ecchymosis.  Neuro: Cranial nerves intact. Normal muscle tone, no cerebellar symptoms. Psych: Awake and oriented X 3, normal affect, Insight and Judgment appropriate.    Medicare Attestation I have personally reviewed: The patient's medical and social history Their use of alcohol, tobacco or illicit drugs Their current medications and supplements The patient's functional ability including ADLs,fall risks, home safety risks, cognitive, and hearing and visual impairment Diet and physical activities Evidence for depression or mood disorders  The patient's weight, height, BMI, and visual acuity have been recorded in the chart.  I have made referrals, counseling, and provided education to the patient based on review of the above and I have provided the patient with a written personalized care plan for preventive services.      Raynelle Dick, NP 3:54 PM [facility]

## 2023-03-25 ENCOUNTER — Ambulatory Visit (INDEPENDENT_AMBULATORY_CARE_PROVIDER_SITE_OTHER): Payer: Medicare PPO | Admitting: Nurse Practitioner

## 2023-03-25 ENCOUNTER — Encounter: Payer: Self-pay | Admitting: Nurse Practitioner

## 2023-03-25 VITALS — BP 112/64 | HR 90 | Temp 97.7°F | Ht 69.0 in | Wt 194.4 lb

## 2023-03-25 DIAGNOSIS — R6889 Other general symptoms and signs: Secondary | ICD-10-CM

## 2023-03-25 DIAGNOSIS — E559 Vitamin D deficiency, unspecified: Secondary | ICD-10-CM

## 2023-03-25 DIAGNOSIS — N401 Enlarged prostate with lower urinary tract symptoms: Secondary | ICD-10-CM

## 2023-03-25 DIAGNOSIS — E782 Mixed hyperlipidemia: Secondary | ICD-10-CM

## 2023-03-25 DIAGNOSIS — N138 Other obstructive and reflux uropathy: Secondary | ICD-10-CM

## 2023-03-25 DIAGNOSIS — Z0000 Encounter for general adult medical examination without abnormal findings: Secondary | ICD-10-CM

## 2023-03-25 DIAGNOSIS — I700 Atherosclerosis of aorta: Secondary | ICD-10-CM | POA: Diagnosis not present

## 2023-03-25 DIAGNOSIS — Z87891 Personal history of nicotine dependence: Secondary | ICD-10-CM

## 2023-03-25 DIAGNOSIS — E66811 Obesity, class 1: Secondary | ICD-10-CM

## 2023-03-25 DIAGNOSIS — R7309 Other abnormal glucose: Secondary | ICD-10-CM | POA: Diagnosis not present

## 2023-03-25 DIAGNOSIS — Z0001 Encounter for general adult medical examination with abnormal findings: Secondary | ICD-10-CM

## 2023-03-25 DIAGNOSIS — N3281 Overactive bladder: Secondary | ICD-10-CM

## 2023-03-25 DIAGNOSIS — I10 Essential (primary) hypertension: Secondary | ICD-10-CM

## 2023-03-25 DIAGNOSIS — E349 Endocrine disorder, unspecified: Secondary | ICD-10-CM

## 2023-03-25 DIAGNOSIS — Z79899 Other long term (current) drug therapy: Secondary | ICD-10-CM

## 2023-03-25 MED ORDER — MIRABEGRON ER 25 MG PO TB24: 25.0000 mg | ORAL_TABLET | Freq: Every day | ORAL | 3 refills | Status: AC

## 2023-03-25 NOTE — Patient Instructions (Signed)
GENERAL HEALTH GOALS   Know what a healthy weight is for you (roughly BMI <25) and aim to maintain this   Aim for 7+ servings of fruits and vegetables daily   70-80+ fluid ounces of water or unsweet tea for healthy kidneys   Limit to max 1 drink of alcohol per day; avoid smoking/tobacco   Limit animal fats in diet for cholesterol and heart health - choose grass fed whenever available   Avoid highly processed foods, and foods high in saturated/trans fats   Aim for low stress - take time to unwind and care for your mental health   Aim for 150 min of moderate intensity exercise weekly for heart health, and weights twice weekly for bone health   Aim for 7-9 hours of sleep daily   Overactive Bladder, Adult  Overactive bladder is a condition in which a person has a sudden and frequent need to urinate. A person might also leak urine if he or she cannot get to the bathroom fast enough (urinary incontinence). Sometimes, symptoms can interfere with work or social activities. What are the causes? Overactive bladder is associated with poor nerve signals between your bladder and your brain. Your bladder may get the signal to empty before it is full. You may also have very sensitive muscles that make your bladder squeeze too soon. This condition may also be caused by other factors, such as: Medical conditions: Urinary tract infection. Infection of nearby tissues. Prostate enlargement. Bladder stones, inflammation, or tumors. Diabetes. Muscle or nerve weakness, especially from these conditions: A spinal cord injury. Stroke. Multiple sclerosis. Parkinson's disease. Other causes: Surgery on the uterus or urethra. Drinking too much caffeine or alcohol. Certain medicines, especially those that eliminate extra fluid in the body (diuretics). Constipation. What increases the risk? You may be at greater risk for overactive bladder if you: Are an older adult. Smoke. Are going through  menopause. Have prostate problems. Have a neurological disease, such as stroke, dementia, Parkinson's disease, or multiple sclerosis (MS). Eat or drink alcohol, spicy food, caffeine, and other things that irritate the bladder. Are overweight or obese. What are the signs or symptoms? Symptoms of this condition include a sudden, strong urge to urinate. Other symptoms include: Leaking urine. Urinating 8 or more times a day. Waking up to urinate 2 or more times overnight. How is this diagnosed? This condition may be diagnosed based on: Your symptoms and medical history. A physical exam. Blood or urine tests to check for possible causes, such as infection. You may also need to see a health care provider who specializes in urinary tract problems. This is called a urologist. How is this treated? Treatment for overactive bladder depends on the cause of your condition and whether it is mild or severe. Treatment may include: Bladder training, such as: Learning to control the urge to urinate by following a schedule to urinate at regular intervals. Doing Kegel exercises to strengthen the pelvic floor muscles that support your bladder. Special devices, such as: Biofeedback. This uses sensors to help you become aware of your body's signals. Electrical stimulation. This uses electrodes placed inside the body (implanted) or outside the body. These electrodes send gentle pulses of electricity to strengthen the nerves or muscles that control the bladder. Women may use a plastic device, called a pessary, that fits into the vagina and supports the bladder. Medicines, such as: Antibiotics to treat bladder infection. Antispasmodics to stop the bladder from releasing urine at the wrong time. Tricyclic antidepressants to relax  bladder muscles. Injections of botulinum toxin type A directly into the bladder tissue to relax bladder muscles. Surgery, such as: A device may be implanted to help manage the nerve  signals that control urination. An electrode may be implanted to stimulate electrical signals in the bladder. A procedure may be done to change the shape of the bladder. This is done only in very severe cases. Follow these instructions at home: Eating and drinking  Make diet or lifestyle changes recommended by your health care provider. These may include: Drinking fluids throughout the day and not only with meals. Cutting down on caffeine or alcohol. Eating a healthy and balanced diet to prevent constipation. This may include: Choosing foods that are high in fiber, such as beans, whole grains, and fresh fruits and vegetables. Limiting foods that are high in fat and processed sugars, such as fried and sweet foods. Lifestyle  Lose weight if needed. Do not use any products that contain nicotine or tobacco. These include cigarettes, chewing tobacco, and vaping devices, such as e-cigarettes. If you need help quitting, ask your health care provider. General instructions Take over-the-counter and prescription medicines only as told by your health care provider. If you were prescribed an antibiotic medicine, take it as told by your health care provider. Do not stop taking the antibiotic even if you start to feel better. Use any implants or pessary as told by your health care provider. If needed, wear pads to absorb urine leakage. Keep a log to track how much and when you drink, and when you need to urinate. This will help your health care provider monitor your condition. Keep all follow-up visits. This is important. Contact a health care provider if: You have a fever or chills. Your symptoms do not get better with treatment. Your pain and discomfort get worse. You have more frequent urges to urinate. Get help right away if: You are not able to control your bladder. Summary Overactive bladder refers to a condition in which a person has a sudden and frequent need to urinate. Several conditions  may lead to an overactive bladder. Treatment for overactive bladder depends on the cause and severity of your condition. Making lifestyle changes, doing Kegel exercises, keeping a log, and taking medicines can help with this condition. This information is not intended to replace advice given to you by your health care provider. Make sure you discuss any questions you have with your health care provider. Document Revised: 01/17/2020 Document Reviewed: 01/17/2020 Elsevier Patient Education  2024 [employer].

## 2023-03-26 LAB — COMPLETE METABOLIC PANEL WITH GFR
AG Ratio: 2.2 (calc) (ref 1.0–2.5)
ALT: 15 U/L (ref 9–46)
AST: 14 U/L (ref 10–35)
Albumin: 4.7 g/dL (ref 3.6–5.1)
Alkaline phosphatase (APISO): 71 U/L (ref 35–144)
BUN: 17 mg/dL (ref 7–25)
CO2: 27 mmol/L (ref 20–32)
Calcium: 11.2 mg/dL — ABNORMAL HIGH (ref 8.6–10.3)
Chloride: 104 mmol/L (ref 98–110)
Creat: 1.05 mg/dL (ref 0.70–1.28)
Globulin: 2.1 g/dL (ref 1.9–3.7)
Glucose, Bld: 140 mg/dL — ABNORMAL HIGH (ref 65–99)
Potassium: 4.9 mmol/L (ref 3.5–5.3)
Sodium: 141 mmol/L (ref 135–146)
Total Bilirubin: 0.6 mg/dL (ref 0.2–1.2)
Total Protein: 6.8 g/dL (ref 6.1–8.1)
eGFR: 75 mL/min/{1.73_m2} (ref >=60)

## 2023-03-26 LAB — CBC WITH DIFFERENTIAL/PLATELET
Absolute Lymphocytes: 1120 {cells}/uL (ref 850–3900)
Absolute Monocytes: 510 {cells}/uL (ref 200–950)
Basophils Absolute: 40 {cells}/uL (ref 0–200)
Basophils Relative: 0.4 %
Eosinophils Absolute: 0 {cells}/uL — ABNORMAL LOW (ref 15–500)
Eosinophils Relative: 0 %
HCT: 49.8 % (ref 38.5–50.0)
Hemoglobin: 17 g/dL (ref 13.2–17.1)
MCH: 31.1 pg (ref 27.0–33.0)
MCHC: 34.1 g/dL (ref 32.0–36.0)
MCV: 91.2 fL (ref 80.0–100.0)
MPV: 10.9 fL (ref 7.5–12.5)
Monocytes Relative: 5.1 %
Neutro Abs: 8330 {cells}/uL — ABNORMAL HIGH (ref 1500–7800)
Neutrophils Relative %: 83.3 %
Platelets: 267 10*3/uL (ref 140–400)
RBC: 5.46 10*6/uL (ref 4.20–5.80)
RDW: 12.5 % (ref 11.0–15.0)
Total Lymphocyte: 11.2 %
WBC: 10 10*3/uL (ref 3.8–10.8)

## 2023-03-26 LAB — LIPID PANEL
Cholesterol: 186 mg/dL (ref <200)
HDL: 49 mg/dL (ref >=40)
LDL Cholesterol (Calc): 112 mg/dL — ABNORMAL HIGH
Non-HDL Cholesterol (Calc): 137 mg/dL — ABNORMAL HIGH (ref <130)
Total CHOL/HDL Ratio: 3.8 (calc) (ref <5.0)
Triglycerides: 137 mg/dL (ref <150)

## 2023-03-26 LAB — MAGNESIUM: Magnesium: 2.3 mg/dL (ref 1.5–2.5)

## 2023-09-19 ENCOUNTER — Encounter: Payer: Self-pay | Admitting: *Deleted

## 2023-09-19 ENCOUNTER — Other Ambulatory Visit: Payer: Self-pay

## 2023-09-19 ENCOUNTER — Ambulatory Visit
Admission: EM | Admit: 2023-09-19 | Discharge: 2023-09-19 | Disposition: A | Discharge status: Home / Self Care | Attending: Internal Medicine | Admitting: Internal Medicine

## 2023-09-19 DIAGNOSIS — J069 Acute upper respiratory infection, unspecified: Secondary | ICD-10-CM | POA: Diagnosis present

## 2023-09-19 LAB — POC SARS CORONAVIRUS 2 AG -  ED: SARS Coronavirus 2 Ag: NEGATIVE

## 2023-09-19 MED ORDER — BENZONATATE 100 MG PO CAPS: 100.0000 mg | ORAL_CAPSULE | Freq: Three times a day (TID) | ORAL | 0 refills | Status: AC

## 2023-09-19 MED ORDER — GUAIFENESIN ER 600 MG PO TB12: 600.0000 mg | ORAL_TABLET | Freq: Two times a day (BID) | ORAL | 0 refills | Status: AC

## 2023-09-19 MED ORDER — ACETAMINOPHEN 325 MG PO TABS: 975.0000 mg | ORAL_TABLET | Freq: Once | ORAL | Status: DC

## 2023-09-19 NOTE — ED Triage Notes (Signed)
 Pt presents with cough,sore throat, congestion since yesterday. Cough is persistent, he is taking cough drops

## 2023-09-19 NOTE — ED Provider Notes (Signed)
 [facility]    CSN: [id number] Arrival date & time: 09/19/23  1710      History   Chief Complaint Chief Complaint  Patient presents with   Cough    HPI [facility] is a 73 y.o. male.   [name] is a 73 y.o. male presenting for chief complaint of Cough, congestion, sore throat, generalized fatigue, that started yesterday on Sep 18, 2023.  Cough is mostly dry and non-productive. Denies shortness of breath, chest pain, dizziness, leg swelling, orthopnea, nausea, vomiting, diarrhea, abdominal pain, and rash.  No recent sick contacts with similar symptoms. Former smoker.  Denies history of asthma/COPD. He has not attempted use of any over-the-counter medications to help with symptoms PTA.   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 Authorizing Provider  benzonatate  (TESSALON ) 100 MG capsule Take 1 capsule (100 mg total) by mouth every 8 (eight) hours. 09/19/23  Yes Starlene Eaton, FNP  guaiFENesin (MUCINEX) 600 MG 12 hr tablet Take 1 tablet (600 mg total) by mouth 2 (two) times daily. 09/19/23  Yes Starlene Eaton, FNP  ascorbic acid  (VITAMIN C) 500 MG tablet Takes 1 tablet Daily Patient not taking:  Reported on 09/19/2023 11/30/21   Vangie Genet, MD  aspirin EC 81 MG tablet Take 81 mg by mouth daily. Swallow whole. Patient not taking: Reported on 09/19/2023    [provider]  Cholecalciferol (VITAMIN D ) 125 MCG (5000 UT) CAPS Takes 1 capsule Daily Patient not taking: Reported on 09/19/2023 11/30/21   Vangie Genet, MD  mirabegron  ER (MYRBETRIQ ) 25 MG TB24 tablet Take 1 tablet (25 mg total) by mouth daily. Patient not taking: Reported on 09/19/2023 03/25/23   Wilkinson, Dana E, FNP  multivitamin (ONE-A-DAY MEN'S) TABS tablet Takes 1 tablet  Daily Patient not taking: Reported on 09/19/2023 11/30/21   Vangie Genet, MD  Omega-3 Fatty Acids (OMEGA 3 PO) Take by mouth. Patient not taking: Reported on 09/19/2023    [provider]  tadalafil  (CIALIS ) 20 MG tablet Take 1/2 to 1 tablet every 2 to 3 days as needed for XXXX Patient not taking: Reported on 09/19/2023 12/20/22   Vangie Genet, MD  TURMERIC PO Take 1 tablet by mouth daily. Patient not taking: Reported on 03/11/2023    [provider]  zinc gluconate 50 MG tablet Take 50 mg by mouth daily. Patient not taking: Reported on 09/19/2023    [provider]    Family History Family History  Problem Relation Age of Onset   Heart disease Father 38       killed   Heart disease Paternal Grandfather 22  MI died   Heart disease Paternal Aunt 2   Heart disease Paternal Uncle        3 uncles all died 28-70    Social History Social History   Tobacco Use   Smoking status: Former    Current packs/day: 0.00    Types: Cigarettes    Quit date: 05/13/1998    Years since quitting: 25.3   Smokeless tobacco: Never  Vaping Use   Vaping status: Never Used  Substance Use Topics   Alcohol use: No   Drug use: No     Allergies   Codeine   Review of Systems Review of Systems  Respiratory:  Positive for cough.   Per HPI   Physical Exam Triage Vital Signs ED Triage Vitals [09/19/23 1845]  Encounter  Vitals Group     BP (!) 146/76     Systolic BP Percentile      Diastolic BP Percentile      Pulse Rate 99     Resp 20     Temp 99.7 F (37.6 C)     Temp Source Oral     SpO2 97 %     Weight      Height      Head Circumference      Peak Flow      Pain Score 5     Pain Loc      Pain Education      Exclude from Growth Chart    No data found.  Updated Vital Signs BP (!) 146/76 (BP Location: Left Arm)   Pulse 99   Temp 99.7 F (37.6 C) (Oral)   Resp 20   SpO2 97%   Visual Acuity Right Eye Distance:   Left Eye Distance:   Bilateral Distance:    Right Eye Near:   Left Eye Near:    Bilateral Near:     Physical Exam Vitals and nursing note reviewed.  Constitutional:      Appearance: He is not ill-appearing or toxic-appearing.  HENT:     Head: Normocephalic and atraumatic.     Right Ear: Hearing and external ear normal.     Left Ear: Hearing and external ear normal.     Nose: Nose normal.     Mouth/Throat:     Lips: Pink.  Eyes:     General: Lids are normal. Vision grossly intact. Gaze aligned appropriately.     Extraocular Movements: Extraocular movements intact.     Conjunctiva/sclera: Conjunctivae normal.  Pulmonary:     Effort: Pulmonary effort is normal.  Musculoskeletal:     Cervical back: Neck supple.  Skin:    General: Skin is warm and dry.     Capillary Refill: Capillary refill takes less than 2 seconds.     Findings: No rash.  Neurological:     General: No focal deficit present.     Mental Status: He is alert and oriented to person, place, and time. Mental status is at baseline.     Cranial Nerves: No dysarthria or facial asymmetry.  Psychiatric:        Mood and Affect: Mood normal.        Speech: Speech normal.        Behavior: Behavior normal.        Thought Content: Thought content normal.        Judgment: Judgment normal.      UC Treatments / Results  Labs (all labs ordered are listed, but only abnormal results are  displayed) Labs  Reviewed  POC SARS CORONAVIRUS 2 AG -  ED - Normal  SARS CORONAVIRUS 2 (TAT 6-24 HRS)    EKG   Radiology No results found.  Procedures Procedures (including critical care time)  Medications Ordered in UC Medications - No data to display  Initial Impression / Assessment and Plan / UC Course  I have reviewed the triage vital signs and the nursing notes.  Pertinent labs & imaging results that were available during my care of the patient were reviewed by me and considered in my medical decision making (see chart for details).   1. Viral URI with cough Suspect viral URI, viral syndrome.  Strep/viral testing: POC COVID negative, PCR COVID pending.  Staff will call if PCR is positive.   Physical exam findings reassuring, vital signs hemodynamically stable, and lungs clear, therefore deferred imaging of the chest.  Advised supportive care/prescriptions for symptomatic relief as outlined in AVS.    Counseled patient on potential for adverse effects with medications prescribed/recommended today, strict ER and return-to-clinic precautions discussed, patient verbalized understanding.    Final Clinical Impressions(s) / UC Diagnoses   Final diagnoses:  Viral URI with cough     Discharge Instructions      You have a viral illness which will improve on its own with rest, fluids, and medications to help with your symptoms.  Tylenol , guaifenesin (plain mucinex), and saline nasal sprays may help relieve symptoms.  Tessalon  perles every 8 hours as needed.   Two teaspoons of honey in 1 cup of warm water every 4-6 hours may help with throat pains.  Humidifier in room at nighttime may help soothe cough (clean well daily).   For chest pain, shortness of breath, inability to keep food or fluids down without vomiting, fever that does not respond to tylenol  or motrin, or any other severe symptoms, please go to the ER for further evaluation. Return to urgent care as needed, otherwise  follow-up with PCP.     ED Prescriptions     Medication Sig Dispense Auth. Provider   benzonatate  (TESSALON ) 100 MG capsule Take 1 capsule (100 mg total) by mouth every 8 (eight) hours. 21 capsule Shella Devoid M, FNP   guaiFENesin (MUCINEX) 600 MG 12 hr tablet Take 1 tablet (600 mg total) by mouth 2 (two) times daily. 20 tablet Starlene Eaton, FNP      PDMP not reviewed this encounter.   Shella Devoid [place], [place] 09/20/23 [phone]

## 2023-09-19 NOTE — Discharge Instructions (Signed)
 You have a viral illness which will improve on its own with rest, fluids, and medications to help with your symptoms.  Tylenol , guaifenesin (plain mucinex), and saline nasal sprays may help relieve symptoms.  Tessalon  perles every 8 hours as needed.   Two teaspoons of honey in 1 cup of warm water every 4-6 hours may help with throat pains.  Humidifier in room at nighttime may help soothe cough (clean well daily).   For chest pain, shortness of breath, inability to keep food or fluids down without vomiting, fever that does not respond to tylenol  or motrin, or any other severe symptoms, please go to the ER for further evaluation. Return to urgent care as needed, otherwise follow-up with PCP.

## 2023-09-19 NOTE — ED Notes (Signed)
 Pt undischarged to collect PCR covid requistion

## 2023-09-20 LAB — SARS CORONAVIRUS 2 (TAT 6-24 HRS): SARS Coronavirus 2: NEGATIVE

## 2023-10-01 ENCOUNTER — Ambulatory Visit (INDEPENDENT_AMBULATORY_CARE_PROVIDER_SITE_OTHER): Admitting: Family

## 2023-10-01 ENCOUNTER — Encounter: Payer: Self-pay | Admitting: Family

## 2023-10-01 VITALS — BP 120/68 | HR 77 | Wt 195.8 lb

## 2023-10-01 DIAGNOSIS — J069 Acute upper respiratory infection, unspecified: Secondary | ICD-10-CM | POA: Diagnosis not present

## 2023-10-01 DIAGNOSIS — N529 Male erectile dysfunction, unspecified: Secondary | ICD-10-CM | POA: Diagnosis not present

## 2023-10-01 DIAGNOSIS — Z7689 Persons encountering health services in other specified circumstances: Secondary | ICD-10-CM

## 2023-10-01 MED ORDER — SILDENAFIL CITRATE 25 MG PO TABS: ORAL_TABLET | ORAL | 2 refills | Status: DC

## 2023-10-01 NOTE — Progress Notes (Signed)
 Subjective:    [facility] - 74 y.o. male MRN [id number]  Date of birth: [birth year]  HPI  [facility] is to establish care and Urgent Care follow-up.  Current issues and/or concerns: 09/19/2023 [facility]) per NP note:    Initial Impression / Assessment and Plan / UC Course  I have reviewed the triage vital signs and the nursing notes.   Pertinent labs & imaging results that were available during my care of the patient were reviewed by me and considered in my medical decision making (see chart for details).    1. Viral URI with cough Suspect viral URI, viral syndrome.  Strep/viral testing: POC COVID negative, PCR COVID pending.  Staff will call if PCR is positive.    Physical exam findings reassuring, vital signs hemodynamically stable, and lungs clear, therefore deferred imaging of the chest.  Advised supportive care/prescriptions for symptomatic relief as outlined in AVS.     Counseled patient on potential for adverse effects with medications prescribed/recommended today, strict ER and return-to-clinic precautions discussed, patient verbalized understanding.     Discharge Instructions        You have a viral illness which will improve on its own with rest, fluids, and medications to help with your symptoms.   Tylenol , guaifenesin  (plain mucinex ), and saline nasal sprays may help relieve symptoms.  Tessalon  perles every 8 hours as needed.    Two teaspoons of honey in 1 cup of warm water every 4-6 hours may help with throat pains.   Humidifier in room at nighttime may help soothe cough (clean well daily).    For chest pain, shortness of breath, inability to keep food or fluids down without vomiting, fever that does not respond to tylenol  or motrin, or any other severe symptoms, please go to the ER for further evaluation. Return to urgent care as needed, otherwise follow-up with PCP.    Today's office visit 10/01/2023: - Reports  feeling improved since recent Urgent Care visit. Denies red flag symptoms.  - Requests refills of Viagra . States former primary provider prescribed him Cialis  and it made him sick.  - Established with an ear doctor.  - Established with Orthopedics.  - No further issues/concerns for discussion today.   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 His smoking use included cigarettes. He has never used smokeless tobacco. He reports that he does not drink alcohol and does not use drugs.   Family History  family history includes Heart disease in his paternal uncle; Heart disease (age of onset: 68) in his paternal grandfather; Heart disease (age of onset: 87) in his paternal aunt; Heart disease (age of onset: 47) in his father.   Medications: reviewed and  updated   Objective:   Physical Exam BP 120/68 (BP Location: Left Arm, Patient Position: Sitting, Cuff Size: Normal)   Pulse 77   Wt 195 lb 12.8 oz (88.8 kg)   SpO2 96%   BMI 28.91 kg/m   Physical Exam HENT:     Head: Normocephalic and atraumatic.     Nose: Nose normal.     Mouth/Throat:     Mouth: Mucous membranes are moist.     Pharynx: Oropharynx is clear.  Eyes:     Extraocular Movements: Extraocular  movements intact.     Conjunctiva/sclera: Conjunctivae normal.     Pupils: Pupils are equal, round, and reactive to light.  Cardiovascular:     Rate and Rhythm: Normal rate and regular rhythm.     Pulses: Normal pulses.     Heart sounds: Normal heart sounds.  Pulmonary:     Effort: Pulmonary effort is normal.     Breath sounds: Normal breath sounds.  Musculoskeletal:        General: Normal range of motion.     Cervical back: Normal range of motion and neck supple.  Neurological:     General: No focal deficit present.     Mental Status: He is alert and oriented to person, place, and time.  Psychiatric:        Mood and Affect: Mood normal.        Behavior: Behavior normal.       Assessment & Plan:  1. Encounter to establish care (Primary) - Patient presents today to establish care. During the interim follow-up with primary provider as scheduled.  - Return for annual physical examination, labs, and health maintenance. Arrive fasting meaning having no food for at least 8 hours prior to appointment. You may have only water or black coffee. Please take scheduled medications as normal.  2. Viral URI with cough - Resolved.  3. Erectile dysfunction, unspecified erectile dysfunction type - Continue Sildenafil  as prescribed. Counseled on medication adherence/adverse effects.  - Follow-up with primary provider as scheduled. - sildenafil  (VIAGRA ) 25 MG tablet; Take 1 tablet 1/2 hour to 1 hour prior to intercourse as needed. Limit use to 1/2 tablet or 1 tablet per 24 hours.  Dispense: 30 tablet; Refill: 2   Patient was given clear instructions to go to Emergency Department or return to medical center if symptoms don't improve, worsen, or new problems develop.The patient verbalized understanding.  I discussed the assessment and treatment plan with the patient. The patient was provided an opportunity to ask questions and all were answered. The patient agreed with the plan and demonstrated an  understanding of the instructions.   The patient was advised to call back or seek an in-person evaluation if the symptoms worsen or if the condition fails to improve as anticipated.    Lavona Pounds, NP 10/01/2023, 3:12 PM Primary Care at [facility]

## 2024-01-01 ENCOUNTER — Encounter: Payer: Medicare PPO | Admitting: Internal Medicine

## 2024-02-24 DIAGNOSIS — H35373 Puckering of macula, bilateral: Secondary | ICD-10-CM | POA: Diagnosis not present

## 2024-02-24 DIAGNOSIS — Z961 Presence of intraocular lens: Secondary | ICD-10-CM | POA: Diagnosis not present

## 2024-04-12 NOTE — Progress Notes (Unsigned)
 New Patient Visit  Subjective:     Patient ID: [facility], male    DOB: 07-14-1950, 73 y.o.   MRN: [id number]  No chief complaint on file.   HPI  Discussed the use of AI scribe software for clinical note transcription with the patient, who gave verbal consent to proceed.  History of Present Illness      ROS Per HPI  Outpatient Encounter Medications as of 04/13/2024  Medication Sig   ascorbic acid  (VITAMIN C) 500 MG tablet Takes 1 tablet Daily (Patient not taking: Reported on 10/01/2023)   aspirin EC 81 MG tablet Take 81 mg by mouth daily. Swallow whole. (Patient not taking: Reported on 10/01/2023)   benzonatate  (TESSALON ) 100 MG capsule Take 1 capsule (100 mg total) by mouth every 8 (eight) hours. (Patient not taking: Reported on 10/01/2023)   Cholecalciferol (VITAMIN D ) 125 MCG (5000 UT) CAPS Takes 1 capsule Daily (Patient not taking: Reported on 10/01/2023)   guaiFENesin  (MUCINEX ) 600 MG 12 hr tablet Take 1 tablet (600 mg total) by mouth 2 (two) times daily. (Patient not taking: Reported on 10/01/2023)   mirabegron  ER (MYRBETRIQ ) 25 MG TB24 tablet Take 1 tablet (25 mg total) by mouth daily. (Patient not taking: Reported on 10/01/2023)   multivitamin (ONE-A-DAY MEN'S) TABS tablet Takes 1 tablet  Daily (Patient not taking: Reported on 10/01/2023)   Omega-3 Fatty Acids (OMEGA 3 PO) Take by mouth. (Patient not taking: Reported on 10/01/2023)   sildenafil  (VIAGRA ) 25 MG tablet Take 1 tablet 1/2 hour to 1 hour prior to intercourse as needed. Limit use to 1/2 tablet or 1 tablet per 24 hours.   tadalafil  (CIALIS ) 20 MG tablet Take 1/2 to 1 tablet every 2 to 3 days as needed for XXXX   TURMERIC PO Take 1 tablet by mouth daily. (Patient not taking: Reported on 10/01/2023)   zinc gluconate 50 MG tablet Take 50 mg by mouth daily. (Patient not taking: Reported on 10/01/2023)   No facility-administered encounter medications on file as of 04/13/2024.    Past Medical History:  Diagnosis Date    Arthritis    Cataract    Hearing aid worn     Past Surgical History:  Procedure Laterality Date   EYE SURGERY     PANCREAS SURGERY      Family History  Problem Relation Age of Onset   Heart disease Father 62       killed   Heart disease Paternal Grandfather 75       MI died   Heart disease Paternal Aunt 42   Heart disease Paternal Uncle        3 uncles all died 69-70    Social History   Socioeconomic History   Marital status: Married    Spouse name: Not on file   Number of children: Not on file   Years of education: Not on file   Highest education level: 12th grade  Occupational History   Not on file  Tobacco Use   Smoking status: Former    Current packs/day: 0.00    Types: Cigarettes    Quit date: 05/13/1998    Years since quitting: 25.9   Smokeless tobacco: Never  Vaping Use   Vaping status: Never Used  Substance and Sexual Activity   Alcohol use: No   Drug use: No   Sexual activity: Not on file  Other Topics Concern   Not on file  Social History Narrative   Employment: works in teacher, english as a foreign language for [employer].   Social Drivers of Corporate Investment Banker Strain: Low Risk  (04/12/2024)   Overall Financial Resource Strain (CARDIA)    Difficulty of Paying Living Expenses: Not hard at all  Food Insecurity: No Food Insecurity (04/12/2024)   Hunger Vital Sign    Worried About Running Out of Food in the Last Year: Never true    Ran Out of Food in the Last Year: Never true  Transportation Needs: No Transportation Needs (04/12/2024)   PRAPARE - Administrator, Civil Service (Medical): No    Lack of Transportation (Non-Medical): No  Physical Activity: Inactive (04/12/2024)   Exercise Vital Sign    Days of Exercise per Week: 0 days    Minutes of Exercise per Session: Not on file  Stress: No Stress Concern Present (04/12/2024)   [employer] of Occupational Health - Occupational Stress Questionnaire    Feeling of Stress: Not at  all  Social Connections: Socially Integrated (04/12/2024)   Social Connection and Isolation Panel    Frequency of Communication with Friends and Family: More than three times a week    Frequency of Social Gatherings with Friends and Family: Once a week    Attends Religious Services: More than 4 times per year    Active Member of [employer] or Organizations: Yes    Attends Engineer, Structural: More than 4 times per year    Marital Status: Married  Catering Manager Violence: Not on file       Objective:    There were no vitals taken for this visit.   Physical Exam Vitals and nursing note reviewed.  Constitutional:      General: He is not in acute distress.    Appearance: Normal appearance.  HENT:     Head: Normocephalic and atraumatic.     Right Ear: External ear normal.     Left Ear: External ear normal.     Nose: Nose normal.     Mouth/Throat:     Mouth: Mucous membranes are moist.     Pharynx: Oropharynx is clear.  Eyes:     Extraocular Movements: Extraocular movements intact.  Cardiovascular:     Rate and Rhythm: Normal rate and regular rhythm.     Pulses: Normal pulses.     Heart sounds: Normal heart sounds.  Pulmonary:     Effort: Pulmonary effort is normal. No respiratory distress.     Breath sounds: Normal breath sounds. No wheezing, rhonchi or rales.  Musculoskeletal:        General: Normal range of motion.     Cervical back: Normal range of motion.     Right lower leg: No edema.     Left lower leg: No edema.  Lymphadenopathy:     Cervical: No cervical adenopathy.  Skin:    General: Skin is warm and dry.  Neurological:     General: No focal deficit present.     Mental Status: He is alert and oriented to person, place, and time.  Psychiatric:        Mood and Affect: Mood normal.        Behavior: Behavior normal.     No results found for any visits on 04/13/24.      Assessment & Plan:   Assessment and Plan Assessment & Plan      No  orders of the defined types were placed in this encounter.    No orders of the defined types were placed in this  encounter.   No follow-ups on file.  Corean LITTIE Ku, FNP

## 2024-04-12 NOTE — Patient Instructions (Incomplete)
 Welcome to [facility]!  Thank you for choosing us  for your Primary Care needs.   We offer in person and video appointments for your convenience. You may call our office to schedule appointments, or you may schedule appointments with me through MyChart.   The best way to get in contact with me is via MyChart message. This will get to me faster than a phone call, unless there is an emergency, then please call 911.  The lab is located downstairs in the Sports Medicine building, we also have xray available there.   We are checking labs today, will be in contact with any results that require further attention.  We are getting an xray today. We will be in contact with any abnormal results that require further attention.

## 2024-04-13 ENCOUNTER — Encounter: Payer: Self-pay | Admitting: Family Medicine

## 2024-04-13 ENCOUNTER — Ambulatory Visit: Payer: Self-pay | Admitting: Family Medicine

## 2024-04-13 ENCOUNTER — Ambulatory Visit: Admitting: Family Medicine

## 2024-04-13 ENCOUNTER — Ambulatory Visit

## 2024-04-13 VITALS — BP 124/70 | HR 76 | Temp 97.6°F | Ht 69.0 in | Wt 194.4 lb

## 2024-04-13 DIAGNOSIS — M25562 Pain in left knee: Secondary | ICD-10-CM | POA: Diagnosis not present

## 2024-04-13 DIAGNOSIS — G8929 Other chronic pain: Secondary | ICD-10-CM | POA: Diagnosis not present

## 2024-04-13 DIAGNOSIS — R5383 Other fatigue: Secondary | ICD-10-CM | POA: Diagnosis not present

## 2024-04-13 DIAGNOSIS — Z79899 Other long term (current) drug therapy: Secondary | ICD-10-CM | POA: Diagnosis not present

## 2024-04-13 DIAGNOSIS — E782 Mixed hyperlipidemia: Secondary | ICD-10-CM

## 2024-04-13 DIAGNOSIS — E559 Vitamin D deficiency, unspecified: Secondary | ICD-10-CM | POA: Diagnosis not present

## 2024-04-13 DIAGNOSIS — Z7689 Persons encountering health services in other specified circumstances: Secondary | ICD-10-CM

## 2024-04-13 DIAGNOSIS — N529 Male erectile dysfunction, unspecified: Secondary | ICD-10-CM

## 2024-04-13 DIAGNOSIS — M1712 Unilateral primary osteoarthritis, left knee: Secondary | ICD-10-CM | POA: Diagnosis not present

## 2024-04-13 DIAGNOSIS — I10 Essential (primary) hypertension: Secondary | ICD-10-CM

## 2024-04-13 LAB — COMPREHENSIVE METABOLIC PANEL WITH GFR
ALT: 15 U/L (ref 0–53)
AST: 16 U/L (ref 0–37)
Albumin: 4.6 g/dL (ref 3.5–5.2)
Alkaline Phosphatase: 61 U/L (ref 39–117)
BUN: 14 mg/dL (ref 6–23)
CO2: 29 meq/L (ref 19–32)
Calcium: 10.3 mg/dL (ref 8.4–10.5)
Chloride: 103 meq/L (ref 96–112)
Creatinine, Ser: 0.92 mg/dL (ref 0.40–1.50)
GFR: 82.48 mL/min (ref >60.00)
Glucose, Bld: 109 mg/dL — ABNORMAL HIGH (ref 70–99)
Potassium: 4.3 meq/L (ref 3.5–5.1)
Sodium: 140 meq/L (ref 135–145)
Total Bilirubin: 0.9 mg/dL (ref 0.2–1.2)
Total Protein: 6.4 g/dL (ref 6.0–8.3)

## 2024-04-13 LAB — LIPID PANEL
Cholesterol: 147 mg/dL (ref 0–200)
HDL: 35.9 mg/dL — ABNORMAL LOW (ref >39.00)
LDL Cholesterol: 84 mg/dL (ref 0–99)
NonHDL: 110.7
Total CHOL/HDL Ratio: 4
Triglycerides: 132 mg/dL (ref 0.0–149.0)
VLDL: 26.4 mg/dL (ref 0.0–40.0)

## 2024-04-13 LAB — TSH: TSH: 3.54 u[IU]/mL (ref 0.35–5.50)

## 2024-04-13 LAB — CBC WITH DIFFERENTIAL/PLATELET
Basophils Absolute: 0 K/uL (ref 0.0–0.1)
Basophils Relative: 0.4 % (ref 0.0–3.0)
Eosinophils Absolute: 0.2 K/uL (ref 0.0–0.7)
Eosinophils Relative: 2.1 % (ref 0.0–5.0)
HCT: 47.1 % (ref 39.0–52.0)
Hemoglobin: 16.4 g/dL (ref 13.0–17.0)
Lymphocytes Relative: 21.6 % (ref 12.0–46.0)
Lymphs Abs: 1.9 K/uL (ref 0.7–4.0)
MCHC: 34.9 g/dL (ref 30.0–36.0)
MCV: 88.4 fl (ref 78.0–100.0)
Monocytes Absolute: 0.7 K/uL (ref 0.1–1.0)
Monocytes Relative: 8.2 % (ref 3.0–12.0)
Neutro Abs: 6.1 K/uL (ref 1.4–7.7)
Neutrophils Relative %: 67.7 % (ref 43.0–77.0)
Platelets: 238 K/uL (ref 150.0–400.0)
RBC: 5.33 Mil/uL (ref 4.22–5.81)
RDW: 13.1 % (ref 11.5–15.5)
WBC: 9 K/uL (ref 4.0–10.5)

## 2024-04-13 LAB — VITAMIN D 25 HYDROXY (VIT D DEFICIENCY, FRACTURES): VITD: 33.51 ng/mL (ref 30.00–100.00)

## 2024-04-13 LAB — MICROALBUMIN / CREATININE URINE RATIO
Creatinine,U: 150.5 mg/dL
Microalb Creat Ratio: UNDETERMINED mg/g (ref 0.0–30.0)
Microalb, Ur: <0.7 mg/dL

## 2024-04-13 LAB — VITAMIN B12: Vitamin B-12: 386 pg/mL (ref 211–911)

## 2024-04-13 LAB — PSA: PSA: 0.59 ng/mL (ref 0.10–4.00)

## 2024-04-13 MED ORDER — SILDENAFIL CITRATE 25 MG PO TABS: ORAL_TABLET | ORAL | 2 refills | Status: AC

## 2024-05-14 ENCOUNTER — Ambulatory Visit

## 2024-05-14 ENCOUNTER — Telehealth: Payer: Self-pay

## 2024-05-14 VITALS — BP 120/70 | HR 85 | Ht 69.0 in | Wt 197.2 lb

## 2024-05-14 DIAGNOSIS — Z0000 Encounter for general adult medical examination without abnormal findings: Secondary | ICD-10-CM | POA: Diagnosis not present

## 2024-05-14 NOTE — Progress Notes (Addendum)
 "  Chief Complaint  Patient presents with   Medicare Wellness     Subjective:   Perry Evans is a 74 y.o. male who presents for a Medicare Annual Wellness Visit.  Visit info / Clinical Intake: Medicare Wellness Visit Type:: Subsequent Annual Wellness Visit Persons participating in visit and providing information:: patient Medicare Wellness Visit Mode:: In-person (required for WTM) Interpreter Needed?: No Pre-visit prep was completed: yes AWV questionnaire completed by patient prior to visit?: yes Date:: 05/10/24 Living arrangements:: (Patient-Rptd) lives with spouse/significant other Patient's Overall Health Status Rating: (Patient-Rptd) very good Typical amount of pain: (Patient-Rptd) some Does pain affect daily life?: (!) (Patient-Rptd) yes Are you currently prescribed opioids?: no  Dietary Habits and Nutritional Risks How many meals a day?: (Patient-Rptd) 3 Eats fruit and vegetables daily?: (!) (Patient-Rptd) no Most meals are obtained by: (Patient-Rptd) preparing own meals; eating out In the last 2 weeks, have you had any of the following?: (!) nausea, vomiting, diarrhea Diabetic:: no  Functional Status Activities of Daily Living (to include ambulation/medication): (Patient-Rptd) Independent Ambulation: (Patient-Rptd) Independent Medication Administration: (Patient-Rptd) Independent Home Management (perform basic housework or laundry): (Patient-Rptd) Independent Manage your own finances?: (Patient-Rptd) yes Primary transportation is: (Patient-Rptd) driving Concerns about vision?: no *vision screening is required for WTM* Concerns about hearing?: (!) yes Uses hearing aids?: (!) yes Hear whispered voice?: yes  Fall Screening Falls in the past year?: (Patient-Rptd) 0 Number of falls in past year: 0 Was there an injury with Fall?: 0 Fall Risk Category Calculator: 0 Patient Fall Risk Level: Low Fall Risk  Fall Risk Patient at Risk for Falls Due to: No Fall Risks Fall  risk Follow up: Falls evaluation completed; Falls prevention discussed  Home and Transportation Safety: All rugs have non-skid backing?: (Patient-Rptd) yes All stairs or steps have railings?: (Patient-Rptd) yes Grab bars in the bathtub or shower?: (!) (Patient-Rptd) no Have non-skid surface in bathtub or shower?: (Patient-Rptd) yes Good home lighting?: (Patient-Rptd) yes Regular seat belt use?: (Patient-Rptd) yes Hospital stays in the last year:: (Patient-Rptd) no  Cognitive Assessment Difficulty concentrating, remembering, or making decisions? : (Patient-Rptd) yes What year is it?: 0 points What month is it?: 0 points Give patient an address phrase to remember (5 components): [address], [place] About what time is it?: 0 points Count backwards from 20 to 1: 0 points Say the months of the year in reverse: 2 points (missed August) Repeat the address phrase from earlier: 0 points 6 CIT Score: 2 points  Advance Directives (For Healthcare) Does Patient Have a Medical Advance Directive?: Yes Type of Advance Directive: Healthcare Power of [place]; Living will Copy of Healthcare Power of Attorney in Chart?: No - copy requested Copy of Living Will in Chart?: No - copy requested  Reviewed/Updated  Reviewed/Updated: Reviewed All (Medical, Surgical, Family, Medications, Allergies, Care Teams, Patient Goals)    Allergies (verified) Codeine   Current Medications (verified) Outpatient Encounter Medications as of 05/14/2024  Medication Sig   ascorbic acid  (VITAMIN C) 500 MG tablet Takes 1 tablet Daily   guaiFENesin  (MUCINEX ) 600 MG 12 hr tablet Take 1 tablet (600 mg total) by mouth 2 (two) times daily.   aspirin EC 81 MG tablet Take 81 mg by mouth daily. Swallow whole. (Patient not taking: Reported on 05/14/2024)   benzonatate  (TESSALON ) 100 MG capsule Take 1 capsule (100 mg total) by mouth every 8 (eight) hours. (Patient not taking: Reported on 05/14/2024)   Cholecalciferol (VITAMIN D )  125 MCG (5000 UT) CAPS Takes  1 capsule Daily (Patient not taking: Reported on 05/14/2024)   mirabegron  ER (MYRBETRIQ ) 25 MG TB24 tablet Take 1 tablet (25 mg total) by mouth daily. (Patient not taking: Reported on 05/14/2024)   multivitamin (ONE-A-DAY MEN'S) TABS tablet Takes 1 tablet  Daily (Patient not taking: Reported on 05/14/2024)   Omega-3 Fatty Acids (OMEGA 3 PO) Take by mouth. (Patient not taking: Reported on 05/14/2024)   sildenafil  (VIAGRA ) 25 MG tablet Take 1 tablet 1/2 hour to 1 hour prior to intercourse as needed. Limit use to 1/2 tablet or 1 tablet per 24 hours. (Patient not taking: Reported on 05/14/2024)   TURMERIC PO Take 1 tablet by mouth daily. (Patient not taking: Reported on 05/14/2024)   zinc gluconate 50 MG tablet Take 50 mg by mouth daily. (Patient not taking: Reported on 05/14/2024)   No facility-administered encounter medications on file as of 05/14/2024.    History: Past Medical History:  Diagnosis Date   Arthritis    Cataract    Hearing aid worn    Past Surgical History:  Procedure Laterality Date   EYE SURGERY     PANCREAS SURGERY     Family History  Problem Relation Age of Onset   Heart disease Father 20       killed   Heart disease Paternal Grandfather 21       MI died   Heart disease Paternal Aunt 68   Heart disease Paternal Uncle        3 uncles all died 58-70   Social History   Occupational History   Occupation: RETIRED  Tobacco Use   Smoking status: Former    Current packs/day: 0.00    Types: Cigarettes    Quit date: 05/13/1998    Years since quitting: 26.0   Smokeless tobacco: Never  Vaping Use   Vaping status: Never Used  Substance and Sexual Activity   Alcohol use: No   Drug use: No   Sexual activity: Not on file   Tobacco Counseling Counseling given: Not Answered  SDOH Screenings   Food Insecurity: No Food Insecurity (05/14/2024)  Housing: Low Risk (05/14/2024)  Transportation Needs: No Transportation Needs (05/14/2024)  Utilities: Not At Risk  (05/14/2024)  Depression (PHQ2-9): Low Risk (05/14/2024)  Recent Concern: Depression (PHQ2-9) - Medium Risk (04/13/2024)  Financial Resource Strain: Low Risk (04/12/2024)  Physical Activity: Inactive (05/14/2024)  Social Connections: Socially Integrated (05/14/2024)  Stress: No Stress Concern Present (05/14/2024)  Tobacco Use: Medium Risk (05/14/2024)  Health Literacy: Adequate Health Literacy (05/14/2024)   See flowsheets for full screening details  Depression Screen PHQ 2 & 9 Depression Scale- Over the past 2 weeks, how often have you been bothered by any of the following problems? Little interest or pleasure in doing things: 0 Feeling down, depressed, or hopeless (PHQ Adolescent also includes...irritable): 0 PHQ-2 Total Score: 0 Trouble falling or staying asleep, or sleeping too much: 0 Feeling tired or having little energy: 0 Poor appetite or overeating (PHQ Adolescent also includes...weight loss): 0 Feeling bad about yourself - or that you are a failure or have let yourself or your family down: 0 Trouble concentrating on things, such as reading the newspaper or watching television (PHQ Adolescent also includes...like school work): 0 Moving or speaking so slowly that other people could have noticed. Or the opposite - being so fidgety or restless that you have been moving around a lot more than usual: 0 Thoughts that you would be better off dead, or of hurting yourself in some way: 0  PHQ-9 Total Score: 0 If you checked off any problems, how difficult have these problems made it for you to do your work, take care of things at home, or get along with other people?: Not difficult at all  Depression Treatment Depression Interventions/Treatment : EYV7-0 Score <4 Follow-up Not Indicated     Goals Addressed             This Visit's Progress    Increase physical activity               Objective:    Today's Vitals   05/14/24 1551  BP: 120/70  Pulse: 85  SpO2: 98%  Weight: 197 lb 3.2 oz  (89.4 kg)  Height: 5' 9 (1.753 m)   Body mass index is 29.12 kg/m.  Hearing/Vision screen Hearing Screening - Comments:: Wears hearing aides Vision Screening - Comments:: Denies vision issues./UTD/Dr. Octavia Immunizations and Health Maintenance Health Maintenance  Topic Date Due   COVID-19 Vaccine (1) Never done   Zoster Vaccines- Shingrix (1 of 2) Never done   Pneumococcal Vaccine: 50+ Years (2 of 2 - PCV20 or PCV21) 11/02/2019   Influenza Vaccine  08/10/2024 (Originally 12/12/2023)   Medicare Annual Wellness (AWV)  05/14/2025   Fecal DNA (Cologuard)  01/16/2026   DTaP/Tdap/Td (4 - Td or Tdap) 11/01/2028   Hepatitis C Screening  Completed   Meningococcal B Vaccine  Aged Out   Colonoscopy  Discontinued        Assessment/Plan:  This is a routine wellness examination for Khiem.  Patient Care Team: Alvia Corean CROME, FNP as PCP - General (Family Medicine) [facility]  I have personally reviewed and noted the following in the patients chart:   Medical and social history Use of alcohol, tobacco or illicit drugs  Current medications and supplements including opioid prescriptions. Functional ability and status Nutritional status Physical activity Advanced directives List of other physicians Hospitalizations, surgeries, and ER visits in previous 12 months Vitals Screenings to include cognitive, depression, and falls Referrals and appointments  No orders of the defined types were placed in this encounter.  In addition, I have reviewed and discussed with patient certain preventive protocols, quality metrics, and best practice recommendations. A written personalized care plan for preventive services as well as general preventive health recommendations were provided to patient.   Esteban Kobashigawa L Camelia Stelzner, CMA   05/14/2024   Return in 1 year (on 05/14/2025).  After Visit Summary: (MyChart) Due to this being a telephonic visit, the after visit summary with patients  personalized plan was offered to patient via MyChart   Nurse Notes: Patient declines all due vaccines.  I have sent a TE to provider today in regards to patient concerns about a sinus infection going on x 2 weeks now.  Patient is requesting for medication to be sent in to pharmacy today.   "

## 2024-05-14 NOTE — Patient Instructions (Addendum)
 Perry Evans,  Thank you for taking the time for your Medicare Wellness Visit. I appreciate your continued commitment to your health goals. Please review the care plan we discussed, and feel free to reach out if I can assist you further.  Please note that Annual Wellness Visits do not include a physical exam. Some assessments may be limited, especially if the visit was conducted virtually. If needed, we may recommend an in-person follow-up with your provider.  Ongoing Care Seeing your primary care provider every 3 to 6 months helps us  monitor your health and provide consistent, personalized care. Last office visit on 04/13/2024.  I have sent a request to provider in regards to your sinus infection so please check Mychart for updates.  Aim for 30 minutes of exercise or brisk walking, 6-8 glasses of water, and 5 servings of fruits and vegetables each day.   Referrals If a referral was made during today's visit and you haven't received any updates within two weeks, please contact the referred provider directly to check on the status.  Recommended Screenings:  Health Maintenance  Topic Date Due   COVID-19 Vaccine (1) Never done   Zoster (Shingles) Vaccine (1 of 2) Never done   Pneumococcal Vaccine for age over 34 (2 of 2 - PCV20 or PCV21) 11/02/2019   Medicare Annual Wellness Visit  03/24/2024   Flu Shot  08/10/2024*   Cologuard (Stool DNA test)  01/16/2026   DTaP/Tdap/Td vaccine (4 - Td or Tdap) 11/01/2028   Hepatitis C Screening  Completed   Meningitis B Vaccine  Aged Out   Colon Cancer Screening  Discontinued  *Topic was postponed. The date shown is not the original due date.       03/25/2023    3:59 PM  Advanced Directives  Does Patient Have a Medical Advance Directive? No  Would patient like information on creating a medical advance directive? No - Patient declined    Vision: Annual vision screenings are recommended for early detection of glaucoma, cataracts, and diabetic  retinopathy. These exams can also reveal signs of chronic conditions such as diabetes and high blood pressure.  Dental: Annual dental screenings help detect early signs of oral cancer, gum disease, and other conditions linked to overall health, including heart disease and diabetes.  Please see the attached documents for additional preventive care recommendations.

## 2024-05-14 NOTE — Telephone Encounter (Signed)
 Patient here and wanted to know if he can get a Zpack RX placed due to a sinus infection x 2 weeks.  Patient stated that he has had a Zpack from previous provider and it worked every time.  He is also asking for some prednisone  as well.  Please advise

## 2024-05-17 ENCOUNTER — Encounter: Payer: Self-pay | Admitting: Internal Medicine

## 2024-05-17 ENCOUNTER — Telehealth (INDEPENDENT_AMBULATORY_CARE_PROVIDER_SITE_OTHER): Admitting: Internal Medicine

## 2024-05-17 DIAGNOSIS — J0100 Acute maxillary sinusitis, unspecified: Secondary | ICD-10-CM

## 2024-05-17 MED ORDER — AMOXICILLIN-POT CLAVULANATE 875-125 MG PO TABS: 1.0000 | ORAL_TABLET | Freq: Two times a day (BID) | ORAL | 0 refills | Status: AC

## 2024-05-17 NOTE — Progress Notes (Signed)
 Virtual Visit via Video Note  I connected with Perry Evans on 05/17/2024 at  1:00 PM EST by a video enabled telemedicine application and verified that I am speaking with the correct person using two identifiers.   I discussed the limitations of evaluation and management by telemedicine and the availability of in person appointments. The patient expressed understanding and agreed to proceed.  Present for the visit:  Myself, Dr Glade Hope, Perry Evans.  The patient is currently at home and I am in the office.    No referring provider.    History of Present Illness: He is here for an acute visit for cold symptoms-possible sinus infection.  His symptoms started over two weeks ago  He is experiencing fatigue, nasal congestion, runny nose, clogged ears, some sinus pain, cough and headaches.  He denies any fevers, sore throat, ear pain, shortness of breath, wheezing or dizziness.  He has tried taking cough syrup, otc cold medications    Review of Systems  Constitutional:  Positive for malaise/fatigue. Negative for chills and fever.  HENT:  Positive for congestion and sinus pain. Negative for ear pain and sore throat.        Runny nose, ears clogged  Respiratory:  Positive for cough. Negative for shortness of breath and wheezing.   Neurological:  Positive for headaches. Negative for dizziness.     Social History   Socioeconomic History   Marital status: Married    Spouse name: vICKIE   Number of children: Not on file   Years of education: Not on file   Highest education level: 12th grade  Occupational History   Occupation: RETIRED  Tobacco Use   Smoking status: Former    Current packs/day: 0.00    Types: Cigarettes    Quit date: 05/13/1998    Years since quitting: 26.0   Smokeless tobacco: Never  Vaping Use   Vaping status: Never Used  Substance and Sexual Activity   Alcohol use: No   Drug use: No   Sexual activity: Not on file  Other Topics Concern   Not on file  Social  History Narrative   Employment: works in teacher, english as a foreign language for [employer].   Lives with wife and haS DOG/2025   Social Drivers of Health   Tobacco Use: Medium Risk (05/14/2024)   Patient History    Smoking Tobacco Use: Former    Smokeless Tobacco Use: Never    Passive Exposure: Not on file  Financial Resource Strain: Low Risk (04/12/2024)   Overall Financial Resource Strain (CARDIA)    Difficulty of Paying Living Expenses: Not hard at all  Food Insecurity: No Food Insecurity (05/14/2024)   Epic    Worried About Programme Researcher, Broadcasting/film/video in the Last Year: Never true    Ran Out of Food in the Last Year: Never true  Transportation Needs: No Transportation Needs (05/14/2024)   Epic    Lack of Transportation (Medical): No    Lack of Transportation (Non-Medical): No  Physical Activity: Inactive (05/14/2024)   Exercise Vital Sign    Days of Exercise per Week: 0 days    Minutes of Exercise per Session: Not on file  Stress: No Stress Concern Present (05/14/2024)   [employer] of Occupational Health - Occupational Stress Questionnaire    Feeling of Stress: Not at all  Social Connections: Socially Integrated (05/14/2024)   Social Connection and Isolation Panel    Frequency of Communication with Friends and Family: More than three times a week  Frequency of Social Gatherings with Friends and Family: Once a week    Attends Religious Services: More than 4 times per year    Active Member of Clubs or Organizations: Yes    Attends Banker Meetings: More than 4 times per year    Marital Status: Married  Depression (PHQ2-9): Low Risk (05/14/2024)   Depression (PHQ2-9)    PHQ-2 Score: 0  Recent Concern: Depression (PHQ2-9) - Medium Risk (04/13/2024)   Depression (PHQ2-9)    PHQ-2 Score: 8  Alcohol Screen: Not on file  Housing: Low Risk (05/14/2024)   Epic    Unable to Pay for Housing in the Last Year: No    Number of Times Moved in the Last Year: 0    Homeless in the Last  Year: No  Utilities: Not At Risk (05/14/2024)   Epic    Threatened with loss of utilities: No  Health Literacy: Adequate Health Literacy (05/14/2024)   B1300 Health Literacy    Frequency of need for help with medical instructions: Never     Observations/Objective: Appears well in NAD Breathing normally, speaking in full sentences Skin appears warm and dry  Assessment and Plan:  Acute Sinus Infection: Acute Likely bacterial  Start Augmentin  875-125 mg BID x 7 day otc cold medications Rest, fluid Call if no improvement    Follow Up Instructions:    I discussed the assessment and treatment plan with the patient. The patient was provided an opportunity to ask questions and all were answered. The patient agreed with the plan and demonstrated an understanding of the instructions.   The patient was advised to call back or seek an in-person evaluation if the symptoms worsen or if the condition fails to improve as anticipated.    Glade JINNY Hope, MD

## 2024-05-17 NOTE — Telephone Encounter (Signed)
 Patient scheduled.

## 2024-05-17 NOTE — Patient Instructions (Signed)
 Perry Evans
